# Patient Record
Sex: Female | Born: 1975 | Race: White | Hispanic: No | Smoking: Never smoker
Health system: Southern US, Community
[De-identification: ages and names within clinical notes are randomized; demographics above are authoritative.]

## PROBLEM LIST (undated history)

## (undated) DIAGNOSIS — D219 Benign neoplasm of connective and other soft tissue, unspecified: Secondary | ICD-10-CM

## (undated) DIAGNOSIS — M75100 Unspecified rotator cuff tear or rupture of unspecified shoulder, not specified as traumatic: Secondary | ICD-10-CM

## (undated) DIAGNOSIS — Z8489 Family history of other specified conditions: Secondary | ICD-10-CM

## (undated) DIAGNOSIS — A749 Chlamydial infection, unspecified: Secondary | ICD-10-CM

## (undated) DIAGNOSIS — R51 Headache: Secondary | ICD-10-CM

## (undated) DIAGNOSIS — F419 Anxiety disorder, unspecified: Secondary | ICD-10-CM

## (undated) DIAGNOSIS — B977 Papillomavirus as the cause of diseases classified elsewhere: Secondary | ICD-10-CM

## (undated) DIAGNOSIS — K219 Gastro-esophageal reflux disease without esophagitis: Secondary | ICD-10-CM

## (undated) DIAGNOSIS — R011 Cardiac murmur, unspecified: Secondary | ICD-10-CM

## (undated) DIAGNOSIS — M1990 Unspecified osteoarthritis, unspecified site: Secondary | ICD-10-CM

## (undated) DIAGNOSIS — IMO0002 Reserved for concepts with insufficient information to code with codable children: Secondary | ICD-10-CM

## (undated) DIAGNOSIS — Z789 Other specified health status: Secondary | ICD-10-CM

## (undated) DIAGNOSIS — R519 Headache, unspecified: Secondary | ICD-10-CM

## (undated) DIAGNOSIS — F329 Major depressive disorder, single episode, unspecified: Secondary | ICD-10-CM

## (undated) DIAGNOSIS — F32A Depression, unspecified: Secondary | ICD-10-CM

## (undated) DIAGNOSIS — I341 Nonrheumatic mitral (valve) prolapse: Secondary | ICD-10-CM

## (undated) HISTORY — DX: Chlamydial infection, unspecified: A74.9

## (undated) HISTORY — DX: Depression, unspecified: F32.A

## (undated) HISTORY — DX: Reserved for concepts with insufficient information to code with codable children: IMO0002

## (undated) HISTORY — DX: Unspecified rotator cuff tear or rupture of unspecified shoulder, not specified as traumatic: M75.100

## (undated) HISTORY — DX: Benign neoplasm of connective and other soft tissue, unspecified: D21.9

## (undated) HISTORY — DX: Papillomavirus as the cause of diseases classified elsewhere: B97.7

## (undated) HISTORY — DX: Anxiety disorder, unspecified: F41.9

## (undated) HISTORY — DX: Major depressive disorder, single episode, unspecified: F32.9

## (undated) HISTORY — DX: Other specified health status: Z78.9

## (undated) HISTORY — DX: Cardiac murmur, unspecified: R01.1

## (undated) HISTORY — DX: Nonrheumatic mitral (valve) prolapse: I34.1

## (undated) HISTORY — DX: Unspecified osteoarthritis, unspecified site: M19.90

---

## 1997-06-25 HISTORY — PX: WISDOM TOOTH EXTRACTION: SHX21

## 1998-06-25 DIAGNOSIS — A749 Chlamydial infection, unspecified: Secondary | ICD-10-CM

## 1998-06-25 HISTORY — DX: Chlamydial infection, unspecified: A74.9

## 2001-05-07 ENCOUNTER — Other Ambulatory Visit: Admission: RE | Admit: 2001-05-07 | Discharge: 2001-05-07 | Payer: Self-pay | Admitting: Obstetrics and Gynecology

## 2001-06-25 DIAGNOSIS — B977 Papillomavirus as the cause of diseases classified elsewhere: Secondary | ICD-10-CM

## 2001-06-25 HISTORY — DX: Papillomavirus as the cause of diseases classified elsewhere: B97.7

## 2001-11-06 ENCOUNTER — Other Ambulatory Visit: Admission: RE | Admit: 2001-11-06 | Discharge: 2001-11-06 | Payer: Self-pay | Admitting: Gynecology

## 2002-01-21 ENCOUNTER — Other Ambulatory Visit: Admission: RE | Admit: 2002-01-21 | Discharge: 2002-01-21 | Payer: Self-pay | Admitting: Gynecology

## 2002-09-23 ENCOUNTER — Other Ambulatory Visit: Admission: RE | Admit: 2002-09-23 | Discharge: 2002-09-23 | Payer: Self-pay | Admitting: Gynecology

## 2003-04-02 ENCOUNTER — Other Ambulatory Visit: Admission: RE | Admit: 2003-04-02 | Discharge: 2003-04-02 | Payer: Self-pay | Admitting: Gynecology

## 2003-12-13 ENCOUNTER — Other Ambulatory Visit: Admission: RE | Admit: 2003-12-13 | Discharge: 2003-12-13 | Payer: Self-pay | Admitting: Gynecology

## 2004-05-05 ENCOUNTER — Other Ambulatory Visit: Admission: RE | Admit: 2004-05-05 | Discharge: 2004-05-05 | Payer: Self-pay | Admitting: Gynecology

## 2005-05-08 ENCOUNTER — Other Ambulatory Visit: Admission: RE | Admit: 2005-05-08 | Discharge: 2005-05-08 | Payer: Self-pay | Admitting: Gynecology

## 2006-05-09 ENCOUNTER — Other Ambulatory Visit: Admission: RE | Admit: 2006-05-09 | Discharge: 2006-05-09 | Payer: Self-pay | Admitting: Gynecology

## 2007-05-14 ENCOUNTER — Other Ambulatory Visit: Admission: RE | Admit: 2007-05-14 | Discharge: 2007-05-14 | Payer: Self-pay | Admitting: Obstetrics and Gynecology

## 2008-06-25 DIAGNOSIS — Z789 Other specified health status: Secondary | ICD-10-CM

## 2008-06-25 HISTORY — DX: Other specified health status: Z78.9

## 2008-07-29 ENCOUNTER — Encounter: Payer: Self-pay | Admitting: Women's Health

## 2008-07-29 ENCOUNTER — Other Ambulatory Visit: Admission: RE | Admit: 2008-07-29 | Discharge: 2008-07-29 | Payer: Self-pay | Admitting: Gynecology

## 2008-07-29 ENCOUNTER — Ambulatory Visit: Payer: Self-pay | Admitting: Women's Health

## 2008-10-18 ENCOUNTER — Ambulatory Visit: Payer: Self-pay | Admitting: Women's Health

## 2009-08-01 ENCOUNTER — Ambulatory Visit: Payer: Self-pay | Admitting: Women's Health

## 2009-08-01 ENCOUNTER — Other Ambulatory Visit: Admission: RE | Admit: 2009-08-01 | Discharge: 2009-08-01 | Payer: Self-pay | Admitting: Gynecology

## 2010-08-04 ENCOUNTER — Other Ambulatory Visit: Payer: Self-pay | Admitting: Women's Health

## 2010-08-04 ENCOUNTER — Other Ambulatory Visit (HOSPITAL_COMMUNITY)
Admission: RE | Admit: 2010-08-04 | Discharge: 2010-08-04 | Disposition: A | Discharge status: Home / Self Care | Payer: BC Managed Care – PPO | Source: Ambulatory Visit | Attending: Gynecology | Admitting: Gynecology

## 2010-08-04 ENCOUNTER — Encounter (INDEPENDENT_AMBULATORY_CARE_PROVIDER_SITE_OTHER): Payer: BC Managed Care – PPO | Admitting: Women's Health

## 2010-08-04 DIAGNOSIS — Z124 Encounter for screening for malignant neoplasm of cervix: Secondary | ICD-10-CM | POA: Insufficient documentation

## 2010-08-04 DIAGNOSIS — Z01419 Encounter for gynecological examination (general) (routine) without abnormal findings: Secondary | ICD-10-CM

## 2010-08-04 DIAGNOSIS — B373 Candidiasis of vulva and vagina: Secondary | ICD-10-CM

## 2010-09-07 ENCOUNTER — Ambulatory Visit: Payer: BC Managed Care – PPO | Admitting: Women's Health

## 2010-09-08 ENCOUNTER — Ambulatory Visit (INDEPENDENT_AMBULATORY_CARE_PROVIDER_SITE_OTHER): Payer: BC Managed Care – PPO | Admitting: Women's Health

## 2010-09-08 DIAGNOSIS — N898 Other specified noninflammatory disorders of vagina: Secondary | ICD-10-CM

## 2010-09-08 DIAGNOSIS — B373 Candidiasis of vulva and vagina: Secondary | ICD-10-CM

## 2010-09-08 DIAGNOSIS — B3731 Acute candidiasis of vulva and vagina: Secondary | ICD-10-CM

## 2010-09-18 ENCOUNTER — Ambulatory Visit (INDEPENDENT_AMBULATORY_CARE_PROVIDER_SITE_OTHER): Payer: BC Managed Care – PPO | Admitting: Women's Health

## 2010-09-18 ENCOUNTER — Other Ambulatory Visit: Payer: BC Managed Care – PPO

## 2010-09-18 DIAGNOSIS — R1031 Right lower quadrant pain: Secondary | ICD-10-CM

## 2010-09-18 DIAGNOSIS — D259 Leiomyoma of uterus, unspecified: Secondary | ICD-10-CM

## 2010-09-18 DIAGNOSIS — IMO0002 Reserved for concepts with insufficient information to code with codable children: Secondary | ICD-10-CM

## 2011-01-10 ENCOUNTER — Ambulatory Visit (INDEPENDENT_AMBULATORY_CARE_PROVIDER_SITE_OTHER): Payer: BC Managed Care – PPO | Admitting: Women's Health

## 2011-01-10 DIAGNOSIS — B373 Candidiasis of vulva and vagina: Secondary | ICD-10-CM

## 2011-01-10 DIAGNOSIS — N898 Other specified noninflammatory disorders of vagina: Secondary | ICD-10-CM

## 2011-01-11 ENCOUNTER — Encounter: Payer: Self-pay | Admitting: *Deleted

## 2011-02-28 ENCOUNTER — Encounter: Payer: Self-pay | Admitting: Women's Health

## 2011-02-28 ENCOUNTER — Ambulatory Visit (INDEPENDENT_AMBULATORY_CARE_PROVIDER_SITE_OTHER): Payer: BC Managed Care – PPO | Admitting: Women's Health

## 2011-02-28 VITALS — BP 130/70

## 2011-02-28 DIAGNOSIS — B373 Candidiasis of vulva and vagina: Secondary | ICD-10-CM

## 2011-02-28 DIAGNOSIS — R350 Frequency of micturition: Secondary | ICD-10-CM

## 2011-02-28 MED ORDER — FLUCONAZOLE 150 MG PO TABS: 150.0000 mg | ORAL_TABLET | Freq: Once | ORAL | Status: AC

## 2011-02-28 NOTE — Progress Notes (Signed)
  Presents with a complaint of increased urinary frequency, pain at the end of the stream, states treated self with Macrobid twice daily for the last 2 days.(Had a RX from greater than a year ago received for trip).  States is feeling better. Denies a fever, no CVAT. UA today is completely negative. Will check a urine culture  Assessment: Probable UTI  Will finish 3 days of Macrobid twice daily, if urine culture is negative will stop after 3 days, check a test of cure in 2 weeks.

## 2012-01-10 ENCOUNTER — Other Ambulatory Visit: Payer: Self-pay | Admitting: Women's Health

## 2012-01-10 MED ORDER — NORETHINDRONE-ETH ESTRADIOL 0.5-35 MG-MCG PO TABS: 1.0000 | ORAL_TABLET | Freq: Every day | ORAL | Status: DC

## 2012-01-10 NOTE — Telephone Encounter (Signed)
I called patient and scheduled CE for next week.  Rx for Oc's refilled one time.

## 2012-01-17 ENCOUNTER — Ambulatory Visit (INDEPENDENT_AMBULATORY_CARE_PROVIDER_SITE_OTHER): Payer: BC Managed Care – PPO | Admitting: Women's Health

## 2012-01-17 ENCOUNTER — Other Ambulatory Visit (HOSPITAL_COMMUNITY)
Admission: RE | Admit: 2012-01-17 | Discharge: 2012-01-17 | Disposition: A | Discharge status: Home / Self Care | Payer: BC Managed Care – PPO | Source: Ambulatory Visit | Attending: Women's Health | Admitting: Women's Health

## 2012-01-17 ENCOUNTER — Encounter: Payer: Self-pay | Admitting: Women's Health

## 2012-01-17 VITALS — BP 112/70 | Ht 68.5 in | Wt 168.0 lb

## 2012-01-17 DIAGNOSIS — Z01419 Encounter for gynecological examination (general) (routine) without abnormal findings: Secondary | ICD-10-CM

## 2012-01-17 DIAGNOSIS — N898 Other specified noninflammatory disorders of vagina: Secondary | ICD-10-CM

## 2012-01-17 DIAGNOSIS — Z1151 Encounter for screening for human papillomavirus (HPV): Secondary | ICD-10-CM | POA: Insufficient documentation

## 2012-01-17 DIAGNOSIS — IMO0001 Reserved for inherently not codable concepts without codable children: Secondary | ICD-10-CM

## 2012-01-17 DIAGNOSIS — Z833 Family history of diabetes mellitus: Secondary | ICD-10-CM

## 2012-01-17 DIAGNOSIS — I059 Rheumatic mitral valve disease, unspecified: Secondary | ICD-10-CM

## 2012-01-17 DIAGNOSIS — N870 Mild cervical dysplasia: Secondary | ICD-10-CM | POA: Insufficient documentation

## 2012-01-17 DIAGNOSIS — I341 Nonrheumatic mitral (valve) prolapse: Secondary | ICD-10-CM | POA: Insufficient documentation

## 2012-01-17 DIAGNOSIS — Z309 Encounter for contraceptive management, unspecified: Secondary | ICD-10-CM

## 2012-01-17 DIAGNOSIS — L293 Anogenital pruritus, unspecified: Secondary | ICD-10-CM

## 2012-01-17 LAB — CBC WITH DIFFERENTIAL/PLATELET
Basophils Absolute: 0 K/uL (ref 0.0–0.1)
Basophils Relative: 1 % (ref 0–1)
Eosinophils Absolute: 0.2 K/uL (ref 0.0–0.7)
Eosinophils Relative: 4 % (ref 0–5)
HCT: 38.1 % (ref 36.0–46.0)
Hemoglobin: 13.1 g/dL (ref 12.0–15.0)
Lymphocytes Relative: 34 % (ref 12–46)
Lymphs Abs: 1.8 K/uL (ref 0.7–4.0)
MCH: 30.1 pg (ref 26.0–34.0)
MCHC: 34.4 g/dL (ref 30.0–36.0)
MCV: 87.6 fL (ref 78.0–100.0)
Monocytes Absolute: 0.4 K/uL (ref 0.1–1.0)
Monocytes Relative: 9 % (ref 3–12)
Neutro Abs: 2.7 K/uL (ref 1.7–7.7)
Neutrophils Relative %: 52 % (ref 43–77)
Platelets: 209 K/uL (ref 150–400)
RBC: 4.35 MIL/uL (ref 3.87–5.11)
RDW: 12.7 % (ref 11.5–15.5)
WBC: 5.2 K/uL (ref 4.0–10.5)

## 2012-01-17 LAB — WET PREP FOR TRICH, YEAST, CLUE
Clue Cells Wet Prep HPF POC: NONE SEEN
Trich, Wet Prep: NONE SEEN
Yeast Wet Prep HPF POC: NONE SEEN

## 2012-01-17 LAB — GLUCOSE, RANDOM: Glucose, Bld: 78 mg/dL (ref 70–99)

## 2012-01-17 MED ORDER — TERCONAZOLE 0.8 % VA CREA: 1.0000 | TOPICAL_CREAM | Freq: Every day | VAGINAL | Status: AC

## 2012-01-17 MED ORDER — NORETHINDRONE-ETH ESTRADIOL 0.5-35 MG-MCG PO TABS: 1.0000 | ORAL_TABLET | Freq: Every day | ORAL | Status: DC

## 2012-01-17 NOTE — Patient Instructions (Addendum)

## 2012-01-17 NOTE — Progress Notes (Signed)
[facility] 1976/02/15 [id number]    History:    The patient presents for annual exam.  Monthly cycle on Necon. 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 Paps normal since 11/05. Has had some problems with recurrent yeast in the past. Currently seeing a counselor for self-esteem/relationship issues on no medication. . Past medical history, past surgical history, family history and social history were all reviewed and documented in the EPIC chart. 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 Rubella immune/2010.   ROS:  A  ROS was performed and pertinent positives and negatives are included in the history.  Exam:  Filed Vitals:   01/17/12 1558  BP: 112/70    General appearance:  Normal Head/Neck:  Normal, without cervical or supraclavicular adenopathy. Thyroid:  Symmetrical, normal in size, without palpable masses or nodularity. Respiratory  Effort:  Normal  Auscultation:  Clear without wheezing or rhonchi Cardiovascular  Auscultation:  Regular rate, without rubs, murmurs or gallops  Edema/varicosities:  Not grossly evident Abdominal  Soft,nontender, without masses, guarding or rebound.  Liver/spleen:  No organomegaly noted  Hernia:  None appreciated  Skin  Inspection:  Grossly normal  Palpation:  Grossly normal Neurologic/psychiatric  Orientation:  Normal with appropriate conversation.  Mood/affect:  Normal  Genitourinary    Breasts: Examined lying and sitting.     Right: Without masses, retractions, discharge or axillary adenopathy.     Left: Without masses, retractions, discharge or axillary adenopathy.   Inguinal/mons:  Normal without inguinal adenopathy  External genitalia:  Normal  BUS/Urethra/Skene's glands:  Normal  Bladder:  Normal  Vagina:  Normal wet prep negative  Cervix:  Normal  Uterus:   normal in size, shape and contour.  Midline and mobile  Adnexa/parametria:     Rt: Without masses or  tenderness.   Lt: Without masses or tenderness.  Anus and perineum: Normal  Digital rectal exam: Normal sphincter tone without palpated masses or tenderness  Assessment/Plan:  36 y.o. S WF G0  for annual exam with no complaints.     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 Aware wet prep was negative today and is symptom-free today. SBE's, exercise, calcium rich diet, MVI daily encouraged. No plans for pregnancy at this time. Necon 0.5/35 prescription, proper use, slight risk for blood clots and strokes reviewed. CBC, glucose, UA and Pap with HR HPV typing.  Screening recommendations reviewed.  Harrington Challenger WHNP, 5:02 PM 01/17/2012

## 2012-01-18 LAB — URINALYSIS W MICROSCOPIC + REFLEX CULTURE
Glucose, UA: NEGATIVE mg/dL
Leukocytes, UA: NEGATIVE
Nitrite: NEGATIVE
Protein, ur: NEGATIVE mg/dL
Squamous Epithelial / LPF: NONE SEEN

## 2012-02-14 ENCOUNTER — Other Ambulatory Visit: Payer: Self-pay | Admitting: *Deleted

## 2012-02-14 DIAGNOSIS — IMO0001 Reserved for inherently not codable concepts without codable children: Secondary | ICD-10-CM

## 2012-02-14 MED ORDER — NORETHINDRONE-ETH ESTRADIOL 0.5-35 MG-MCG PO TABS: 1.0000 | ORAL_TABLET | Freq: Every day | ORAL | Status: DC

## 2012-04-02 ENCOUNTER — Ambulatory Visit (INDEPENDENT_AMBULATORY_CARE_PROVIDER_SITE_OTHER): Payer: BC Managed Care – PPO | Admitting: Licensed Clinical Social Worker

## 2012-04-02 DIAGNOSIS — F4320 Adjustment disorder, unspecified: Secondary | ICD-10-CM

## 2012-04-23 ENCOUNTER — Ambulatory Visit (INDEPENDENT_AMBULATORY_CARE_PROVIDER_SITE_OTHER): Payer: BC Managed Care – PPO | Admitting: Licensed Clinical Social Worker

## 2012-04-23 DIAGNOSIS — F4320 Adjustment disorder, unspecified: Secondary | ICD-10-CM

## 2012-05-19 ENCOUNTER — Ambulatory Visit (INDEPENDENT_AMBULATORY_CARE_PROVIDER_SITE_OTHER): Payer: BC Managed Care – PPO | Admitting: Licensed Clinical Social Worker

## 2012-05-19 DIAGNOSIS — F4320 Adjustment disorder, unspecified: Secondary | ICD-10-CM

## 2012-06-09 ENCOUNTER — Ambulatory Visit (INDEPENDENT_AMBULATORY_CARE_PROVIDER_SITE_OTHER): Payer: BC Managed Care – PPO | Admitting: Licensed Clinical Social Worker

## 2012-06-09 DIAGNOSIS — F4320 Adjustment disorder, unspecified: Secondary | ICD-10-CM

## 2012-07-10 ENCOUNTER — Ambulatory Visit (INDEPENDENT_AMBULATORY_CARE_PROVIDER_SITE_OTHER): Payer: Managed Care, Other (non HMO) | Admitting: Licensed Clinical Social Worker

## 2012-07-10 DIAGNOSIS — F4320 Adjustment disorder, unspecified: Secondary | ICD-10-CM

## 2012-07-11 ENCOUNTER — Ambulatory Visit: Payer: BC Managed Care – PPO | Admitting: Licensed Clinical Social Worker

## 2012-08-04 ENCOUNTER — Ambulatory Visit (INDEPENDENT_AMBULATORY_CARE_PROVIDER_SITE_OTHER): Payer: Managed Care, Other (non HMO) | Admitting: Licensed Clinical Social Worker

## 2012-08-04 DIAGNOSIS — F4320 Adjustment disorder, unspecified: Secondary | ICD-10-CM

## 2012-09-01 ENCOUNTER — Ambulatory Visit (INDEPENDENT_AMBULATORY_CARE_PROVIDER_SITE_OTHER): Payer: Managed Care, Other (non HMO) | Admitting: Licensed Clinical Social Worker

## 2012-09-01 DIAGNOSIS — F4320 Adjustment disorder, unspecified: Secondary | ICD-10-CM

## 2012-09-24 ENCOUNTER — Ambulatory Visit (INDEPENDENT_AMBULATORY_CARE_PROVIDER_SITE_OTHER): Payer: Managed Care, Other (non HMO) | Admitting: Licensed Clinical Social Worker

## 2012-09-24 DIAGNOSIS — F4320 Adjustment disorder, unspecified: Secondary | ICD-10-CM

## 2012-10-15 ENCOUNTER — Ambulatory Visit (INDEPENDENT_AMBULATORY_CARE_PROVIDER_SITE_OTHER): Payer: 59 | Admitting: Licensed Clinical Social Worker

## 2012-10-15 DIAGNOSIS — F4320 Adjustment disorder, unspecified: Secondary | ICD-10-CM

## 2012-11-26 ENCOUNTER — Ambulatory Visit (INDEPENDENT_AMBULATORY_CARE_PROVIDER_SITE_OTHER): Payer: 59 | Admitting: Licensed Clinical Social Worker

## 2012-11-26 DIAGNOSIS — F4320 Adjustment disorder, unspecified: Secondary | ICD-10-CM

## 2013-01-05 ENCOUNTER — Ambulatory Visit (INDEPENDENT_AMBULATORY_CARE_PROVIDER_SITE_OTHER): Payer: 59 | Admitting: Licensed Clinical Social Worker

## 2013-01-05 DIAGNOSIS — F4320 Adjustment disorder, unspecified: Secondary | ICD-10-CM

## 2013-01-22 ENCOUNTER — Other Ambulatory Visit: Payer: Self-pay

## 2013-01-22 DIAGNOSIS — IMO0001 Reserved for inherently not codable concepts without codable children: Secondary | ICD-10-CM

## 2013-01-22 MED ORDER — NORETHINDRONE-ETH ESTRADIOL 0.5-35 MG-MCG PO TABS: 1.0000 | ORAL_TABLET | Freq: Every day | ORAL | Status: DC

## 2013-02-25 ENCOUNTER — Ambulatory Visit (INDEPENDENT_AMBULATORY_CARE_PROVIDER_SITE_OTHER): Payer: 59 | Admitting: Licensed Clinical Social Worker

## 2013-02-25 ENCOUNTER — Other Ambulatory Visit: Payer: Self-pay | Admitting: Women's Health

## 2013-02-25 DIAGNOSIS — F4320 Adjustment disorder, unspecified: Secondary | ICD-10-CM

## 2013-02-25 DIAGNOSIS — IMO0001 Reserved for inherently not codable concepts without codable children: Secondary | ICD-10-CM

## 2013-02-25 MED ORDER — NORETHINDRONE-ETH ESTRADIOL 0.5-35 MG-MCG PO TABS: 1.0000 | ORAL_TABLET | Freq: Every day | ORAL | Status: DC

## 2013-03-20 ENCOUNTER — Ambulatory Visit (INDEPENDENT_AMBULATORY_CARE_PROVIDER_SITE_OTHER): Payer: Managed Care, Other (non HMO) | Admitting: Women's Health

## 2013-03-20 ENCOUNTER — Other Ambulatory Visit (HOSPITAL_COMMUNITY)
Admission: RE | Admit: 2013-03-20 | Discharge: 2013-03-20 | Disposition: A | Discharge status: Home / Self Care | Payer: Managed Care, Other (non HMO) | Source: Ambulatory Visit | Attending: Gynecology | Admitting: Gynecology

## 2013-03-20 ENCOUNTER — Encounter: Payer: Self-pay | Admitting: Women's Health

## 2013-03-20 VITALS — BP 108/70 | Ht 67.0 in | Wt 172.0 lb

## 2013-03-20 DIAGNOSIS — Z309 Encounter for contraceptive management, unspecified: Secondary | ICD-10-CM

## 2013-03-20 DIAGNOSIS — Z01419 Encounter for gynecological examination (general) (routine) without abnormal findings: Secondary | ICD-10-CM

## 2013-03-20 DIAGNOSIS — Z13220 Encounter for screening for lipoid disorders: Secondary | ICD-10-CM

## 2013-03-20 DIAGNOSIS — N938 Other specified abnormal uterine and vaginal bleeding: Secondary | ICD-10-CM

## 2013-03-20 DIAGNOSIS — L293 Anogenital pruritus, unspecified: Secondary | ICD-10-CM

## 2013-03-20 DIAGNOSIS — N925 Other specified irregular menstruation: Secondary | ICD-10-CM

## 2013-03-20 DIAGNOSIS — IMO0001 Reserved for inherently not codable concepts without codable children: Secondary | ICD-10-CM

## 2013-03-20 DIAGNOSIS — N898 Other specified noninflammatory disorders of vagina: Secondary | ICD-10-CM

## 2013-03-20 DIAGNOSIS — N949 Unspecified condition associated with female genital organs and menstrual cycle: Secondary | ICD-10-CM

## 2013-03-20 LAB — CBC WITH DIFFERENTIAL/PLATELET
Basophils Relative: 1 % (ref 0–1)
Eosinophils Absolute: 0.4 10*3/uL (ref 0.0–0.7)
Eosinophils Relative: 6 % — ABNORMAL HIGH (ref 0–5)
Hemoglobin: 14.1 g/dL (ref 12.0–15.0)
Lymphs Abs: 1.7 10*3/uL (ref 0.7–4.0)
MCH: 31 pg (ref 26.0–34.0)
MCHC: 34.9 g/dL (ref 30.0–36.0)
MCV: 88.8 fL (ref 78.0–100.0)
Monocytes Relative: 6 % (ref 3–12)
Neutrophils Relative %: 58 % (ref 43–77)
Platelets: 195 10*3/uL (ref 150–400)
RBC: 4.55 MIL/uL (ref 3.87–5.11)

## 2013-03-20 LAB — WET PREP FOR TRICH, YEAST, CLUE
Clue Cells Wet Prep HPF POC: NONE SEEN
Trich, Wet Prep: NONE SEEN

## 2013-03-20 LAB — TSH: TSH: 1.556 u[IU]/mL (ref 0.350–4.500)

## 2013-03-20 MED ORDER — TERCONAZOLE 0.8 % VA CREA: 1.0000 | TOPICAL_CREAM | Freq: Every day | VAGINAL | Status: DC

## 2013-03-20 MED ORDER — NORETHINDRONE-ETH ESTRADIOL 0.5-35 MG-MCG PO TABS: 1.0000 | ORAL_TABLET | Freq: Every day | ORAL | Status: DC

## 2013-03-20 NOTE — Addendum Note (Signed)
Addended by: Richardson Chiquito on: 03/20/2013 10:58 AM   Modules accepted: Orders

## 2013-03-20 NOTE — Patient Instructions (Addendum)

## 2013-03-20 NOTE — Progress Notes (Signed)
Caroline Hayes [birth year] [id number]    History:    The patient presents for annual exam.  States has had spotting for the past 4-5 months periodically throughout the month. No missed pills. History of recurrent yeast, minimal problems this past year, states feels has yeast now. CIN-2 with negative C&B 2003, CIN-1 on colposcopy 2003. Normal Paps after. Contraceptives with Necon/same partner. Has seen a counselor in the past on no medication and feels that she is doing better. Mitral valve prolapse on no medication. Normal lipid panel and glucose at work. Undecided about children.  Past medical history, past surgical history, family history and social history were all reviewed and documented in the EPIC chart. 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 Maternal aunt breast cancer. Maternal grandmother Alzheimer's.   ROS:  A  ROS was performed and pertinent positives and negatives are included in the history.  Exam:  Filed Vitals:   03/20/13 0810  BP: 108/70    General appearance:  Normal Head/Neck:  Normal, without cervical or supraclavicular adenopathy. Thyroid:  Symmetrical, normal in size, without palpable masses or nodularity. Respiratory  Effort:  Normal  Auscultation:  Clear without wheezing or rhonchi Cardiovascular  Auscultation:  Regular rate, without rubs, murmurs or gallops  Edema/varicosities:  Not grossly evident Abdominal  Soft,nontender, without masses, guarding or rebound.  Liver/spleen:  No organomegaly noted  Hernia:  None appreciated  Skin  Inspection:  Grossly normal  Palpation:  Grossly normal Neurologic/psychiatric  Orientation:  Normal with appropriate conversation.  Mood/affect:  Normal  Genitourinary    Breasts: Examined lying and sitting.     Right: Without masses, retractions, discharge or axillary adenopathy.     Left: Without masses, retractions, discharge or axillary adenopathy.   Inguinal/mons:  Normal without inguinal  adenopathy  External genitalia:  Normal  BUS/Urethra/Skene's glands:  Normal  Bladder:  Normal  Vagina:  Wet prep positive for yeast mild erythema  Cervix:  Normal  Uterus:  normal in size, shape and contour.  Midline and mobile  Adnexa/parametria:     Rt: Without masses or tenderness.   Lt: Without masses or tenderness.  Anus and perineum: Normal  Digital rectal exam: Normal sphincter tone without palpated masses or tenderness  Assessment/Plan:  37 y.o. S. WF G0  for annual exam.    DUB for 4-5 months Yeast/history of recurrent yeast History of anxiety and depression stable no medication CIN-2 with CIN-1 noted on  biopsy 2003 normal Paps after  Plan: Terazol 3 prescription, proper use given and reviewed. Prescription for boric acid gel caps twice weekly to help prevent recurrence, instructed to call if no relief. Necon prescription, proper use, slight risk for blood clots and strokes reviewed. Schedule sonohysterogram with Dr. Lily Peer after next cycle. SBE's, continue regular exercise active lifestyle, folic acid. TSH, prolactin, CBC, UA, Pap. Pap normal 2013.   Harrington Challenger [facility], 8:58 AM 03/20/2013

## 2013-03-21 LAB — URINALYSIS W MICROSCOPIC + REFLEX CULTURE
Bacteria, UA: NONE SEEN
Casts: NONE SEEN
Glucose, UA: NEGATIVE mg/dL
Hgb urine dipstick: NEGATIVE
Ketones, ur: NEGATIVE mg/dL
Protein, ur: NEGATIVE mg/dL
pH: 7 (ref 5.0–8.0)

## 2013-03-21 LAB — PROLACTIN: Prolactin: 7.6 ng/mL

## 2013-03-23 ENCOUNTER — Ambulatory Visit (INDEPENDENT_AMBULATORY_CARE_PROVIDER_SITE_OTHER): Payer: 59 | Admitting: Licensed Clinical Social Worker

## 2013-03-23 DIAGNOSIS — F4320 Adjustment disorder, unspecified: Secondary | ICD-10-CM

## 2013-03-26 ENCOUNTER — Encounter: Payer: Self-pay | Admitting: Women's Health

## 2013-03-30 ENCOUNTER — Encounter: Payer: Self-pay | Admitting: Women's Health

## 2013-04-20 ENCOUNTER — Ambulatory Visit (INDEPENDENT_AMBULATORY_CARE_PROVIDER_SITE_OTHER): Payer: 59 | Admitting: Licensed Clinical Social Worker

## 2013-04-20 DIAGNOSIS — F4320 Adjustment disorder, unspecified: Secondary | ICD-10-CM

## 2013-05-27 ENCOUNTER — Ambulatory Visit (INDEPENDENT_AMBULATORY_CARE_PROVIDER_SITE_OTHER): Payer: 59 | Admitting: Licensed Clinical Social Worker

## 2013-05-27 DIAGNOSIS — F4320 Adjustment disorder, unspecified: Secondary | ICD-10-CM

## 2013-07-01 ENCOUNTER — Ambulatory Visit (INDEPENDENT_AMBULATORY_CARE_PROVIDER_SITE_OTHER): Payer: 59 | Admitting: Licensed Clinical Social Worker

## 2013-07-01 DIAGNOSIS — F4320 Adjustment disorder, unspecified: Secondary | ICD-10-CM

## 2013-08-12 ENCOUNTER — Ambulatory Visit (INDEPENDENT_AMBULATORY_CARE_PROVIDER_SITE_OTHER): Payer: 59 | Admitting: Licensed Clinical Social Worker

## 2013-08-12 DIAGNOSIS — F4321 Adjustment disorder with depressed mood: Secondary | ICD-10-CM

## 2013-08-13 ENCOUNTER — Telehealth: Payer: Self-pay | Admitting: *Deleted

## 2013-08-13 NOTE — Telephone Encounter (Signed)
Pt called requesting call back about ultrasound that should be scheduled? I left message on her voicemail per note on 03/20/13 pt will need SHGM with JF.

## 2013-08-19 ENCOUNTER — Other Ambulatory Visit: Payer: Self-pay | Admitting: Women's Health

## 2013-08-19 DIAGNOSIS — N938 Other specified abnormal uterine and vaginal bleeding: Secondary | ICD-10-CM

## 2013-09-01 ENCOUNTER — Other Ambulatory Visit: Payer: Self-pay | Admitting: Gynecology

## 2013-09-01 DIAGNOSIS — N920 Excessive and frequent menstruation with regular cycle: Secondary | ICD-10-CM

## 2013-09-02 ENCOUNTER — Other Ambulatory Visit: Payer: Self-pay | Admitting: Gynecology

## 2013-09-02 ENCOUNTER — Other Ambulatory Visit: Payer: Managed Care, Other (non HMO)

## 2013-09-02 ENCOUNTER — Other Ambulatory Visit (HOSPITAL_COMMUNITY)
Admission: RE | Admit: 2013-09-02 | Discharge: 2013-09-02 | Disposition: A | Discharge status: Home / Self Care | Payer: Managed Care, Other (non HMO) | Source: Ambulatory Visit | Attending: Gynecology | Admitting: Gynecology

## 2013-09-02 ENCOUNTER — Ambulatory Visit (INDEPENDENT_AMBULATORY_CARE_PROVIDER_SITE_OTHER): Payer: Managed Care, Other (non HMO) | Admitting: Gynecology

## 2013-09-02 ENCOUNTER — Ambulatory Visit: Payer: Managed Care, Other (non HMO) | Admitting: Gynecology

## 2013-09-02 ENCOUNTER — Ambulatory Visit (INDEPENDENT_AMBULATORY_CARE_PROVIDER_SITE_OTHER): Payer: Managed Care, Other (non HMO)

## 2013-09-02 DIAGNOSIS — N938 Other specified abnormal uterine and vaginal bleeding: Secondary | ICD-10-CM

## 2013-09-02 DIAGNOSIS — D251 Intramural leiomyoma of uterus: Secondary | ICD-10-CM

## 2013-09-02 DIAGNOSIS — N920 Excessive and frequent menstruation with regular cycle: Secondary | ICD-10-CM

## 2013-09-02 DIAGNOSIS — D252 Subserosal leiomyoma of uterus: Secondary | ICD-10-CM

## 2013-09-02 DIAGNOSIS — D259 Leiomyoma of uterus, unspecified: Secondary | ICD-10-CM

## 2013-09-02 DIAGNOSIS — N949 Unspecified condition associated with female genital organs and menstrual cycle: Secondary | ICD-10-CM

## 2013-09-02 DIAGNOSIS — N925 Other specified irregular menstruation: Secondary | ICD-10-CM

## 2013-09-02 LAB — CBC WITH DIFFERENTIAL/PLATELET
BASOS ABS: 0 10*3/uL (ref 0.0–0.1)
Basophils Relative: 0 % (ref 0–1)
Eosinophils Absolute: 0.4 10*3/uL (ref 0.0–0.7)
Eosinophils Relative: 5 % (ref 0–5)
HEMATOCRIT: 39.7 % (ref 36.0–46.0)
Hemoglobin: 13.8 g/dL (ref 12.0–15.0)
LYMPHS PCT: 22 % (ref 12–46)
Lymphs Abs: 1.6 10*3/uL (ref 0.7–4.0)
MCH: 30.5 pg (ref 26.0–34.0)
MCHC: 34.8 g/dL (ref 30.0–36.0)
MCV: 87.6 fL (ref 78.0–100.0)
Monocytes Absolute: 0.4 10*3/uL (ref 0.1–1.0)
Monocytes Relative: 5 % (ref 3–12)
NEUTROS ABS: 5 10*3/uL (ref 1.7–7.7)
NEUTROS PCT: 68 % (ref 43–77)
Platelets: 216 10*3/uL (ref 150–400)
RBC: 4.53 MIL/uL (ref 3.87–5.11)
RDW: 12.9 % (ref 11.5–15.5)
WBC: 7.4 10*3/uL (ref 4.0–10.5)

## 2013-09-02 LAB — TSH: TSH: 2.162 u[IU]/mL (ref 0.350–4.500)

## 2013-09-02 NOTE — Progress Notes (Signed)
   Patient presented to the office today for a sonohysterogram as a result of her spotting for the past 4 or 5 months throughout the month while being on oral contraceptive pill Necon. Patient stated she is used to good compliance.CIN-2 with negative C&B 2003, CIN-1 on colposcopy 2003. Normal Pap smear 2014.  Ultrasound today: Uterus measuring 1.5 x 6.1 x 5.8 cm with endometrial stripe of 3.3 mm. Patient had 2 fibroids largest one measuring 41 x 41 x 31 mm displacing the endometrium and a smaller one measuring 9 x 3 mm and a third small one measures 9 x 4 mm. Endometrial stripe 3.3 mm avascular. Both right and left lower were normal. After instilling normal saline into the uterine cavity there was no intracavitary defects noted slight indentation from the posterior fibroid.  Patient's cervix was then cleansed with Betadine solution and a sterile Pipelle was introduced into the uterine cavity and tissue was obtained for histological evaluation.  Assessment/plan: Patient with breakthrough bleeding on oral contraceptive pill. We'll check daily CBC, TSH and prolactin. Biopsy results pending at time of this dictation. She will finish her current pack of OCP and we will place a Mirena IUD with a start her next cycle. Literature and information was provided.

## 2013-09-02 NOTE — Patient Instructions (Signed)
Levonorgestrel intrauterine device (IUD) What is this medicine? LEVONORGESTREL IUD (LEE voe nor jes trel) is a contraceptive (birth control) device. The device is placed inside the uterus by a healthcare professional. It is used to prevent pregnancy and can also be used to treat heavy bleeding that occurs during your period. Depending on the device, it can be used for 3 to 5 years. This medicine may be used for other purposes; ask your health care provider or pharmacist if you have questions. COMMON BRAND NAME(S): Mirena, Skyla What should I tell my health care provider before I take this medicine? They need to know if you have any of these conditions: -abnormal Pap smear -cancer of the breast, uterus, or cervix -diabetes -endometritis -genital or pelvic infection now or in the past -have more than one sexual partner or your partner has more than one partner -heart disease -history of an ectopic or tubal pregnancy -immune system problems -IUD in place -liver disease or tumor -problems with blood clots or take blood-thinners -use intravenous drugs -uterus of unusual shape -vaginal bleeding that has not been explained -an unusual or allergic reaction to levonorgestrel, other hormones, silicone, or polyethylene, medicines, foods, dyes, or preservatives -pregnant or trying to get pregnant -breast-feeding How should I use this medicine? This device is placed inside the uterus by a health care professional. Talk to your pediatrician regarding the use of this medicine in children. Special care may be needed. Overdosage: If you think you have taken too much of this medicine contact a poison control center or emergency room at once. NOTE: This medicine is only for you. Do not share this medicine with others. What if I miss a dose? This does not apply. What may interact with this medicine? Do not take this medicine with any of the following  medications: -amprenavir -bosentan -fosamprenavir This medicine may also interact with the following medications: -aprepitant -barbiturate medicines for inducing sleep or treating seizures -bexarotene -griseofulvin -medicines to treat seizures like carbamazepine, ethotoin, felbamate, oxcarbazepine, phenytoin, topiramate -modafinil -pioglitazone -rifabutin -rifampin -rifapentine -some medicines to treat HIV infection like atazanavir, indinavir, lopinavir, nelfinavir, tipranavir, ritonavir -St. John's wort -warfarin This list may not describe all possible interactions. Give your health care provider a list of all the medicines, herbs, non-prescription drugs, or dietary supplements you use. Also tell them if you smoke, drink alcohol, or use illegal drugs. Some items may interact with your medicine. What should I watch for while using this medicine? Visit your doctor or health care professional for regular check ups. See your doctor if you or your partner has sexual contact with others, becomes HIV positive, or gets a sexual transmitted disease. This product does not protect you against HIV infection (AIDS) or other sexually transmitted diseases. You can check the placement of the IUD yourself by reaching up to the top of your vagina with clean fingers to feel the threads. Do not pull on the threads. It is a good habit to check placement after each menstrual period. Call your doctor right away if you feel more of the IUD than just the threads or if you cannot feel the threads at all. The IUD may come out by itself. You may become pregnant if the device comes out. If you notice that the IUD has come out use a backup birth control method like condoms and call your health care provider. Using tampons will not change the position of the IUD and are okay to use during your period. What side effects may I   notice from receiving this medicine? Side effects that you should report to your doctor or  health care professional as soon as possible: -allergic reactions like skin rash, itching or hives, swelling of the face, lips, or tongue -fever, flu-like symptoms -genital sores -high blood pressure -no menstrual period for 6 weeks during use -pain, swelling, warmth in the leg -pelvic pain or tenderness -severe or sudden headache -signs of pregnancy -stomach cramping -sudden shortness of breath -trouble with balance, talking, or walking -unusual vaginal bleeding, discharge -yellowing of the eyes or skin Side effects that usually do not require medical attention (report to your doctor or health care professional if they continue or are bothersome): -acne -breast pain -change in sex drive or performance -changes in weight -cramping, dizziness, or faintness while the device is being inserted -headache -irregular menstrual bleeding within first 3 to 6 months of use -nausea This list may not describe all possible side effects. Call your doctor for medical advice about side effects. You may report side effects to FDA at 1-800-FDA-1088. Where should I keep my medicine? This does not apply. NOTE: This sheet is a summary. It may not cover all possible information. If you have questions about this medicine, talk to your doctor, pharmacist, or health care provider.  2014, Elsevier/Gold Standard. (2011-07-12 13:54:04)  

## 2013-09-03 LAB — PROLACTIN: Prolactin: 17 ng/mL

## 2013-09-15 ENCOUNTER — Telehealth: Payer: Self-pay | Admitting: Gynecology

## 2013-09-15 ENCOUNTER — Other Ambulatory Visit: Payer: Self-pay | Admitting: Gynecology

## 2013-09-15 ENCOUNTER — Telehealth: Payer: Self-pay | Admitting: *Deleted

## 2013-09-15 DIAGNOSIS — Z3049 Encounter for surveillance of other contraceptives: Secondary | ICD-10-CM

## 2013-09-15 MED ORDER — LEVONORGESTREL 20 MCG/24HR IU IUD: INTRAUTERINE_SYSTEM | Freq: Once | INTRAUTERINE | Status: DC

## 2013-09-15 MED ORDER — TRAMADOL HCL 50 MG PO TABS: ORAL_TABLET | ORAL | Status: DC

## 2013-09-15 NOTE — Telephone Encounter (Signed)
Pt scheduled for Mirena IUD placement on 09/17/13 she is nervous about the pain that may come with this. Pt is requesting something to help with pain prior to placement. Please advise

## 2013-09-15 NOTE — Telephone Encounter (Signed)
rx called in, pt informed. 

## 2013-09-15 NOTE — Telephone Encounter (Signed)
09/15/13-Pt called to schedule Mirena insert but I hadn't received request to check benefits. I called [employer] and it is covered at 100%, no copay. She has appt with JF for 09/17/13.wl

## 2013-09-15 NOTE — Telephone Encounter (Signed)
Ultram 50 mg take one 4 hours before procedure then q 6 ours PRN # [id number]

## 2013-09-17 ENCOUNTER — Encounter: Payer: Self-pay | Admitting: Gynecology

## 2013-09-17 ENCOUNTER — Ambulatory Visit (INDEPENDENT_AMBULATORY_CARE_PROVIDER_SITE_OTHER): Payer: Managed Care, Other (non HMO) | Admitting: Gynecology

## 2013-09-17 VITALS — BP 120/78

## 2013-09-17 DIAGNOSIS — Z30430 Encounter for insertion of intrauterine contraceptive device: Secondary | ICD-10-CM

## 2013-09-17 DIAGNOSIS — Z975 Presence of (intrauterine) contraceptive device: Secondary | ICD-10-CM | POA: Insufficient documentation

## 2013-09-17 NOTE — Patient Instructions (Signed)
Intrauterine Device Information An intrauterine device (IUD) is inserted into your uterus to prevent pregnancy. There are two types of IUDs available:   Copper IUD This type of IUD is wrapped in copper wire and is placed inside the uterus. Copper makes the uterus and fallopian tubes produce a fluid that kills sperm. The copper IUD can stay in place for 10 years.  Hormone IUD This type of IUD contains the hormone progestin (synthetic progesterone). The hormone thickens the cervical mucus and prevents sperm from entering the uterus. It also thins the uterine lining to prevent implantation of a fertilized egg. The hormone can weaken or kill the sperm that get into the uterus. One type of hormone IUD can stay in place for 5 years, and another type can stay in place for 3 years. Your health care provider will make sure you are a good candidate for a contraceptive IUD. Discuss with your health care provider the possible side effects.  ADVANTAGES OF AN INTRAUTERINE DEVICE  IUDs are highly effective, reversible, long acting, and low maintenance.   There are no estrogen-related side effects.   An IUD can be used when breastfeeding.   IUDs are not associated with weight gain.   The copper IUD works immediately after insertion.   The hormone IUD works right away if inserted within 7 days of your period starting. You will need to use a backup method of birth control for 7 days if the hormone IUD is inserted at any other time in your cycle.  The copper IUD does not interfere with your female hormones.   The hormone IUD can make heavy menstrual periods lighter and decrease cramping.   The hormone IUD can be used for 3 or 5 years.   The copper IUD can be used for 10 years. DISADVANTAGES OF AN INTRAUTERINE DEVICE  The hormone IUD can be associated with irregular bleeding patterns.   The copper IUD can make your menstrual flow heavier and more painful.   You may experience cramping and  vaginal bleeding after insertion.  Document Released: 05/15/2004 Document Revised: 02/11/2013 Document Reviewed: 11/30/2012 Exit[employer] Patient Information 2014 [employer].  

## 2013-09-17 NOTE — Progress Notes (Signed)
                                                                     IUD procedure note       Patient presented to the office today for placement of Mirena  IUD. The patient had previously been provided with literature information on this method of contraception. The risks benefits and pros and cons were discussed and all her questions were answered. She is fully aware that this form of contraception is 99% effective and is good for 5 years.  Pelvic exam: Bartholin urethra Skene glands: Within normal limits Vagina: No lesions or discharge Cervix: No lesions or discharge Uterus: AV position Adnexa: No masses or tenderness Rectal exam: Not done  The cervix was cleansed with Betadine solution. A single-tooth tenaculum was placed on the anterior cervical lip. The uterus sounded to 8 centimeter. The IUD was shown to the patient and inserted in a sterile fashion. The IUD string was trimmed. The single-tooth tenaculum was removed. Patient was instructed to return back to the office in one month for follow up.       Lot. No TuOOXFU

## 2013-09-23 ENCOUNTER — Ambulatory Visit (INDEPENDENT_AMBULATORY_CARE_PROVIDER_SITE_OTHER): Payer: 59 | Admitting: Licensed Clinical Social Worker

## 2013-09-23 DIAGNOSIS — F4320 Adjustment disorder, unspecified: Secondary | ICD-10-CM

## 2013-10-15 ENCOUNTER — Ambulatory Visit (INDEPENDENT_AMBULATORY_CARE_PROVIDER_SITE_OTHER): Payer: Managed Care, Other (non HMO) | Admitting: Gynecology

## 2013-10-15 ENCOUNTER — Encounter: Payer: Self-pay | Admitting: Gynecology

## 2013-10-15 VITALS — BP 128/86

## 2013-10-15 DIAGNOSIS — Z30431 Encounter for routine checking of intrauterine contraceptive device: Secondary | ICD-10-CM

## 2013-10-15 NOTE — Progress Notes (Signed)
   The patient presented to the office for her 1 month followup visit after having had a Mirena IUD placed. Patient had some spotting and some mild cramping but otherwise done well.  Exam: Abdomen: Soft nontender rebound guarding Pelvic: The ureters he was within normal limits Vagina: Slight menstrual blood present Cervix: IUD string visualized Bimanual exam: Uterus anteverted normal size shape and consistency Adnexa: No palpable masses. Rectal exam: Not done  Assessment/plan: Patient one month status post placement of Mirena IUD doing well. Patient scheduled to return back in September this year for her annual gynecological examination or when necessary.

## 2013-10-28 ENCOUNTER — Ambulatory Visit: Payer: 59 | Admitting: Licensed Clinical Social Worker

## 2013-11-02 ENCOUNTER — Ambulatory Visit (INDEPENDENT_AMBULATORY_CARE_PROVIDER_SITE_OTHER): Payer: 59 | Admitting: Licensed Clinical Social Worker

## 2013-11-02 DIAGNOSIS — F4320 Adjustment disorder, unspecified: Secondary | ICD-10-CM

## 2013-12-14 ENCOUNTER — Ambulatory Visit: Payer: 59 | Admitting: Licensed Clinical Social Worker

## 2014-01-25 ENCOUNTER — Ambulatory Visit (INDEPENDENT_AMBULATORY_CARE_PROVIDER_SITE_OTHER): Payer: 59 | Admitting: Licensed Clinical Social Worker

## 2014-01-25 DIAGNOSIS — F4320 Adjustment disorder, unspecified: Secondary | ICD-10-CM

## 2014-02-22 ENCOUNTER — Ambulatory Visit (INDEPENDENT_AMBULATORY_CARE_PROVIDER_SITE_OTHER): Payer: 59 | Admitting: Licensed Clinical Social Worker

## 2014-02-22 DIAGNOSIS — F4320 Adjustment disorder, unspecified: Secondary | ICD-10-CM

## 2014-03-22 ENCOUNTER — Ambulatory Visit: Payer: 59 | Admitting: Licensed Clinical Social Worker

## 2014-03-31 ENCOUNTER — Ambulatory Visit (INDEPENDENT_AMBULATORY_CARE_PROVIDER_SITE_OTHER): Payer: 59 | Admitting: Licensed Clinical Social Worker

## 2014-03-31 DIAGNOSIS — F348 Other persistent mood [affective] disorders: Secondary | ICD-10-CM

## 2014-05-26 ENCOUNTER — Ambulatory Visit: Payer: 59 | Admitting: Licensed Clinical Social Worker

## 2014-07-26 HISTORY — PX: ROOT CANAL: SHX2363

## 2014-11-24 ENCOUNTER — Ambulatory Visit (INDEPENDENT_AMBULATORY_CARE_PROVIDER_SITE_OTHER): Payer: Managed Care, Other (non HMO) | Admitting: Nurse Practitioner

## 2014-11-24 ENCOUNTER — Encounter: Payer: Self-pay | Admitting: Nurse Practitioner

## 2014-11-24 VITALS — BP 110/62 | HR 72 | Ht 68.0 in | Wt 177.2 lb

## 2014-11-24 DIAGNOSIS — D259 Leiomyoma of uterus, unspecified: Secondary | ICD-10-CM | POA: Diagnosis not present

## 2014-11-24 DIAGNOSIS — Z01419 Encounter for gynecological examination (general) (routine) without abnormal findings: Secondary | ICD-10-CM

## 2014-11-24 DIAGNOSIS — Z0000 Encounter for general adult medical examination without abnormal findings: Secondary | ICD-10-CM | POA: Diagnosis not present

## 2014-11-24 DIAGNOSIS — N760 Acute vaginitis: Secondary | ICD-10-CM

## 2014-11-24 DIAGNOSIS — Z30431 Encounter for routine checking of intrauterine contraceptive device: Secondary | ICD-10-CM | POA: Diagnosis not present

## 2014-11-24 DIAGNOSIS — T839XXD Unspecified complication of genitourinary prosthetic device, implant and graft, subsequent encounter: Secondary | ICD-10-CM | POA: Diagnosis not present

## 2014-11-24 LAB — POCT URINALYSIS DIPSTICK
Leukocytes, UA: NEGATIVE
Urobilinogen, UA: NEGATIVE
pH, UA: 5

## 2014-11-24 LAB — HEMOGLOBIN, FINGERSTICK: Hemoglobin, fingerstick: 14.6 g/dL (ref 12.0–16.0)

## 2014-11-24 NOTE — Progress Notes (Signed)
39 y.o. G0P0 Single  Caucasian Fe here for NGYN annual exam.  Normal menses lasted for 7 dasys with menorhagia and dysmenorrhea.  She had evaluation with PUS and was found to have 3 uterine fibroids and endo biopsy was negative 08/2013.  She was then treated with a Mirena IUD  09/17/13.   Initially no menses for about three months.  But since then she has not felt well - complains of 'pelvic fulness', presure and dyspareniea.  Some / most of this is cyclic in nature but has not been tracking as to when it occurs.  Same partner for 15 years.  Also history of CIN II with negative colpo biopsy 2003 but paps normal since then.  She is quite concerned about the cause of pain - especially on the RLQ.  She did awaken one night and drew a picture of pain - the next day realized this pain was very low in the pelvis. For years has had chronic BV and yeast infections.  Patient's last menstrual period was 11/10/2014.          Sexually active: Yes.    The current method of family planning is Mirena IUD - insertion 09/17/13   Exercising: Yes.    ellipitcal, trainer, walking Smoker:  no  Health Maintenance: Pap:  03/20/2013 wnl ( history of CIN II 2003 - pap for 20 years) TDaP:  2007/2008 Labs: Hgb: 14.6 ; Urine: Negative   reports that she has never smoked. She has never used smokeless tobacco. She reports that she drinks alcohol. She reports that she does not use illicit drugs.  Past Medical History  Diagnosis Date  . MVP (mitral valve prolapse)   . Heart murmur   . Rubella immune 2010  . Chlamydia 2000  . Anxiety   . Depression   . Dyspareunia   . Fibroid   . 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 Wisdom tooth extraction  1999  . Root canal  07/2014    Current Outpatient Prescriptions  Medication Sig Dispense Refill  . Loratadine (CLARITIN PO) Take by mouth.      . Omeprazole (PRILOSEC PO) Take by mouth as needed.       Current Facility-Administered Medications  Medication Dose Route Frequency Provider Last Rate Last Dose  . levonorgestrel (MIRENA) 20 MCG/24HR IUD   Intrauterine Once Terrance Mass, MD        Family History  Problem Relation Age of Onset  . Breast cancer Maternal Aunt 23    x2 recurence age 32  . Hypertension Mother   . Anemia Mother   . Hypertension Maternal Grandmother   . Cancer Sister   . Heart attack Paternal Grandfather     ROS:  Pertinent items are noted in HPI.  Otherwise, a comprehensive ROS was negative.  Exam:   BP 110/62 mmHg  Pulse 72  Ht 5\' 8"  (1.727 m)  Wt 177 lb 3.2 oz (80.377 kg)  BMI 26.95 kg/m2  LMP 11/10/2014 Height: 5\' 8"  (172.7 cm) Ht Readings from Last 3 Encounters:  11/24/14 5\' 8"  (1.727 m)  03/20/13 5\' 7"  (1.702 m)  01/17/12 5' 8.5" (1.74 m)    General appearance: alert, cooperative and appears stated age Head: Normocephalic, without obvious abnormality, atraumatic Neck: no adenopathy, supple, symmetrical, trachea midline and thyroid normal to inspection and palpation Lungs: clear to auscultation bilaterally Breasts: normal appearance, no masses or  tenderness Heart: regular rate and rhythm Abdomen: soft, non-tender; no masses,  no organomegaly Extremities: extremities normal, atraumatic, no cyanosis or edema Skin: Skin color, texture, turgor normal. No rashes or lesions Lymph nodes: Cervical, supraclavicular, and axillary nodes normal. No abnormal inguinal nodes palpated Neurologic: Grossly normal   Pelvic: External genitalia:  no lesions              Urethra:  normal appearing urethra with no masses, tenderness or lesions              Bartholin's and Skene's: normal                 Vagina: normal appearing vagina with normal color and discharge, no lesions              Cervix: anteverted maybe the edge of IUD string -basically not able to see               Pap taken: Yes.   Bimanual Exam:  Uterus:  enlarged, 5-6 weeks weeks size               Adnexa: no mass, fullness, tenderness and positive for: tenderness on the right adnexa with fullness that feels like an ovarian cyst               Rectovaginal: Confirms               Anus:  normal sphincter tone, no lesions  Chaperone present: Yes  A:  Well Woman with normal exam  History of abnormal pap CIN II 2003 with multiple colpo biopsy  Needs pap yearly X 20  S/P Mirena IUD 09/17/13 - strings not easily visible  Persistent lower pelvic pain   P:   Reviewed health and wellness pertinent to exam  Pap smear as above  Will follow also with Affirm  Discussed other infections that can cause pelvic pain - but she is at risk - since she does have uterine fibroids and IUD - worth repeating PUS to confirm size and position.  Another course of treatment may be antibiotics for 2 weeks and recheck - she would like to proceed with PUS.  Counseled on breast self exam, adequate intake of calcium and vitamin D, diet and exercise return annually or prn  An After Visit Summary was printed and given to the patient.

## 2014-11-24 NOTE — Patient Instructions (Signed)

## 2014-11-25 LAB — WET PREP BY MOLECULAR PROBE
CANDIDA SPECIES: NEGATIVE
Gardnerella vaginalis: NEGATIVE
Trichomonas vaginosis: NEGATIVE

## 2014-11-26 ENCOUNTER — Telehealth: Payer: Self-pay | Admitting: Nurse Practitioner

## 2014-11-26 DIAGNOSIS — T839XXA Unspecified complication of genitourinary prosthetic device, implant and graft, initial encounter: Secondary | ICD-10-CM

## 2014-11-26 LAB — IPS PAP TEST WITH HPV

## 2014-11-26 NOTE — Telephone Encounter (Signed)
Call to patient to go over PUS benefit.  Patient has a mailbox that has not been set up yet.

## 2014-11-28 NOTE — Progress Notes (Signed)
Encounter reviewed by Dr. Brook Silva.  

## 2014-12-16 ENCOUNTER — Encounter: Payer: Self-pay | Admitting: Obstetrics and Gynecology

## 2014-12-16 ENCOUNTER — Ambulatory Visit (INDEPENDENT_AMBULATORY_CARE_PROVIDER_SITE_OTHER): Payer: Managed Care, Other (non HMO) | Admitting: Obstetrics and Gynecology

## 2014-12-16 ENCOUNTER — Ambulatory Visit (INDEPENDENT_AMBULATORY_CARE_PROVIDER_SITE_OTHER): Payer: Managed Care, Other (non HMO)

## 2014-12-16 VITALS — BP 100/74 | HR 44 | Ht 68.0 in | Wt 172.0 lb

## 2014-12-16 DIAGNOSIS — N760 Acute vaginitis: Secondary | ICD-10-CM

## 2014-12-16 DIAGNOSIS — M25559 Pain in unspecified hip: Secondary | ICD-10-CM | POA: Diagnosis not present

## 2014-12-16 DIAGNOSIS — D251 Intramural leiomyoma of uterus: Secondary | ICD-10-CM

## 2014-12-16 DIAGNOSIS — R102 Pelvic and perineal pain: Secondary | ICD-10-CM | POA: Diagnosis not present

## 2014-12-16 DIAGNOSIS — T839XXA Unspecified complication of genitourinary prosthetic device, implant and graft, initial encounter: Secondary | ICD-10-CM

## 2014-12-16 MED ORDER — CIPROFLOXACIN HCL 500 MG PO TABS: 500.0000 mg | ORAL_TABLET | Freq: Two times a day (BID) | ORAL | Status: DC

## 2014-12-16 MED ORDER — FLUCONAZOLE 150 MG PO TABS: 150.0000 mg | ORAL_TABLET | Freq: Once | ORAL | Status: DC

## 2014-12-16 NOTE — Progress Notes (Signed)
Subjective  39 y.o. G0P0  female here for pelvic ultrasound for ultrasound to check IUD position and to evaluate for menorrhagia, dysmenorrhea, and pelvic fullness and pressure.  Had episode of pain in bilateral abdomen, right greater than left.  Some constipation feeling also.  Pain is generally better now.  Mirena IUD placed in 2015.  Had pain for months after IUD placement.  Had mucousy blood last month for her cycle. Cycles fluctuate but in general are improving.   C/o vaginal discharge and odor.  Negative Affirm on 11/29/14. Hx of vaginitis.  Feels dry.   Wants Rx for potential UTI and yeast infection while she travels to [place].   Objective  Pelvic ultrasound images and report reviewed with patient.  Uterus - 3.6 cm left intramural fibroid.  IUD in endometrial canal - proper position. EMS - 4.78 mm. Ovaries - 1.5 cm collapsed left CL cyst. Free fluid -  3 x 3 cm.      Pelvic exam - normal external genitalia and urethra.  Cervix - no IUD strings seen.  Mucousy discharge noted - yellow. Uterus with irregular shape and 6 week size, nontender.  No adnexal masses or tenderness.  Assessment  Uterine fibroid.  Mirena IUD patient.  Pelvic pain.  Possible ruptured ovarian cyst.  Vaginitis.  Desire for UTI Rx and vaginitis Rx.  Plan  Discussion of ovarian cysts and uterine fibroid.  Discussion of possible ovulation with Mirena in place.  Discussion of vaginitis.  Affirm done.  Rx for Cipro and Diflucan for travel.   __25_____ minutes face to face time of which over 50% was spent in counseling.   After visit summary to patient.

## 2014-12-17 LAB — WET PREP BY MOLECULAR PROBE
CANDIDA SPECIES: NEGATIVE
Gardnerella vaginalis: NEGATIVE
Trichomonas vaginosis: NEGATIVE

## 2015-11-28 ENCOUNTER — Encounter: Payer: Self-pay | Admitting: Nurse Practitioner

## 2015-11-28 ENCOUNTER — Ambulatory Visit (INDEPENDENT_AMBULATORY_CARE_PROVIDER_SITE_OTHER): Payer: Managed Care, Other (non HMO) | Admitting: Nurse Practitioner

## 2015-11-28 VITALS — BP 100/60 | HR 52 | Resp 12 | Ht 67.25 in | Wt 178.8 lb

## 2015-11-28 DIAGNOSIS — Z0000 Encounter for general adult medical examination without abnormal findings: Secondary | ICD-10-CM | POA: Diagnosis not present

## 2015-11-28 DIAGNOSIS — Z975 Presence of (intrauterine) contraceptive device: Secondary | ICD-10-CM | POA: Diagnosis not present

## 2015-11-28 DIAGNOSIS — Z01419 Encounter for gynecological examination (general) (routine) without abnormal findings: Secondary | ICD-10-CM | POA: Diagnosis not present

## 2015-11-28 NOTE — Progress Notes (Signed)
40 y.o. G0P0 Single  Caucasian Fe here for annual exam.  Usually spotting with cycle or cramps.  Feels like pain in lower pelvis with cycle is "still there".  Pain is located at ovaries bilaterally.   This type of pain at been there since insertion of Mirena IUD 09/17/13.   Last year seen by Dr. Quincy Simmonds for this pelvic discomfort and PUS showed the IUD in place, 1.5 cm collapsed left OV cyst.  She is still just concerned as the pain has not improved over time.    Before IUD menses was very heavy and flow 6-7 days. She is not wanting pregnancy ever so thinking maybe something surgical can be done to help with these symptoms.  No pain with SA.  There have been some bowel changes with semi soft BM's over the past 2 years.  They occur daily X 1 but has no form to them.  Not related to types of food as this is her "normal" now.  Patient's last menstrual period was 11/07/2015. Pt states is starting heavier menses.          Sexually active: Yes.    The current method of family planning is IUD.    Exercising: Yes.    Gym, treadmill, gardening Smoker:  no  Health Maintenance: Pap:  11/24/14-WNL neg HR HPV TDaP:  2009 HIV: 15 years ago    reports that she has never smoked. She has never used smokeless tobacco. She reports that she drinks about 3.0 - 4.2 oz of alcohol per week. She reports that she does not use illicit drugs.  Past Medical History  Diagnosis Date  . MVP (mitral valve prolapse)   . Heart murmur   . Rubella immune 2010  . Chlamydia 2000  . Anxiety   . Depression   . Dyspareunia   . Fibroid   . 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 Wisdom tooth extraction  1999  . Root canal  07/2014    Current Outpatient Prescriptions  Medication Sig Dispense Refill  . fluconazole (DIFLUCAN) 150 MG tablet Take 1 tablet (150 mg total) by mouth once. Repeat in 48 hours if systems persist. 2 tablet 0  . Loratadine (CLARITIN  PO) Take by mouth.      . Omeprazole (PRILOSEC PO) Take by mouth as needed.      Current Facility-Administered Medications  Medication Dose Route Frequency Provider Last Rate Last Dose  . levonorgestrel (MIRENA) 20 MCG/24HR IUD   Intrauterine Once Terrance Mass, MD        Family History  Problem Relation Age of Onset  . Breast cancer Maternal Aunt 35    x2 recurence age 82  . Hypertension Mother   . Anemia Mother   . Hypertension Maternal Grandmother   . Cancer Sister   . Heart attack Paternal Grandfather     ROS:  Pertinent items are noted in HPI.  Otherwise, a comprehensive ROS was negative.  Exam:   BP 100/60 mmHg  Pulse 52  Resp 12  Ht 5' 7.25" (1.708 m)  Wt 178 lb 12.8 oz (81.103 kg)  BMI 27.80 kg/m2  LMP 11/07/2015 Height: 5' 7.25" (170.8 cm) Ht Readings from Last 3 Encounters:  11/28/15 5' 7.25" (1.708 m)  12/16/14 5\' 8"  (1.727 m)  11/24/14 5\' 8"  (1.727 m)    General appearance: alert, cooperative and appears stated age Head: Normocephalic, without  obvious abnormality, atraumatic Neck: no adenopathy, supple, symmetrical, trachea midline and thyroid normal to inspection and palpation Lungs: clear to auscultation bilaterally Breasts: normal appearance, no masses or tenderness Heart: regular rate and rhythm Abdomen: soft, non-tender; no masses,  no organomegaly Extremities: extremities normal, atraumatic, no cyanosis or edema Skin: Skin color, texture, turgor normal. No rashes or lesions Lymph nodes: Cervical, supraclavicular, and axillary nodes normal. No abnormal inguinal nodes palpated Neurologic: Grossly normal   Pelvic: External genitalia:  no lesions              Urethra:  normal appearing urethra with no masses, tenderness or lesions              Bartholin's and Skene's: normal                 Vagina: normal appearing vagina with normal color and discharge, no lesions              Cervix: anteverted  IUD string not visible              Pap taken:  No. 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 Quincy Simmonds for further recommendations  Counseled on breast self exam, adequate intake of calcium and vitamin D, diet and exercise return annually or prn  An After Visit Summary was printed and given to the patient.

## 2015-11-28 NOTE — Patient Instructions (Signed)

## 2015-11-29 NOTE — Progress Notes (Signed)
Encounter reviewed by Dr. Aundria Rud. I recommend a follow up visit for evaluation and treatment options for pelvic pain.

## 2015-12-13 ENCOUNTER — Other Ambulatory Visit: Payer: Self-pay | Admitting: Nurse Practitioner

## 2015-12-13 ENCOUNTER — Telehealth: Payer: Self-pay | Admitting: Nurse Practitioner

## 2015-12-13 DIAGNOSIS — R198 Other specified symptoms and signs involving the digestive system and abdomen: Secondary | ICD-10-CM

## 2015-12-13 NOTE — Telephone Encounter (Signed)
Patient is called with an update on her lower pelvic pain. We are planning on her returning for OV with Dr. Quincy Simmonds.  But also planning for GI consult with Dr. Collene Mares.  Orders are placed and apt is made.

## 2015-12-15 ENCOUNTER — Ambulatory Visit: Payer: Managed Care, Other (non HMO) | Admitting: Obstetrics and Gynecology

## 2015-12-19 ENCOUNTER — Encounter: Payer: Self-pay | Admitting: Obstetrics and Gynecology

## 2015-12-19 ENCOUNTER — Ambulatory Visit (INDEPENDENT_AMBULATORY_CARE_PROVIDER_SITE_OTHER): Payer: Managed Care, Other (non HMO) | Admitting: Obstetrics and Gynecology

## 2015-12-19 VITALS — BP 110/66 | HR 50 | Ht 67.25 in | Wt 178.6 lb

## 2015-12-19 DIAGNOSIS — Z308 Encounter for other contraceptive management: Secondary | ICD-10-CM

## 2015-12-19 DIAGNOSIS — M25559 Pain in unspecified hip: Secondary | ICD-10-CM

## 2015-12-19 NOTE — Patient Instructions (Addendum)
Please check out [email or web address] or [email or web address] for good medical information.  Leuprolide depot injection What is this medicine? LEUPROLIDE (loo PROE lide) is a man-made protein that acts like a natural hormone in the body. It decreases testosterone in men and decreases estrogen in women. In men, this medicine is used to treat advanced prostate cancer. In women, some forms of this medicine may be used to treat endometriosis, uterine fibroids, or other female hormone-related problems. This medicine may be used for other purposes; ask your health care provider or pharmacist if you have questions. What should I tell my health care provider before I take this medicine? They need to know if you have any of these conditions: -diabetes -heart disease or previous heart attack -high blood pressure -high cholesterol -osteoporosis -pain or difficulty passing urine -spinal cord metastasis -stroke -tobacco smoker -unusual vaginal bleeding (women) -an unusual or allergic reaction to leuprolide, benzyl alcohol, other medicines, foods, dyes, or preservatives -pregnant or trying to get pregnant -breast-feeding How should I use this medicine? This medicine is for injection into a muscle or for injection under the skin. It is given by a health care professional in a hospital or clinic setting. The specific product will determine how it will be given to you. Make sure you understand which product you receive and how often you will receive it. Talk to your pediatrician regarding the use of this medicine in children. Special care may be needed. Overdosage: If you think you have taken too much of this medicine contact a poison control center or emergency room at once. NOTE: This medicine is only for you. Do not share this medicine with others. What if I miss a dose? It is important not to miss a dose. Call your doctor or health care professional if you are unable to keep an appointment. Depot injections: Depot  injections are given either once-monthly, every 12 weeks, every 16 weeks, or every 24 weeks depending on the product you are prescribed. The product you are prescribed will be based on if you are female or female, and your condition. Make sure you understand your product and dosing. What may interact with this medicine? Do not take this medicine with any of the following medications: -chasteberry This medicine may also interact with the following medications: -herbal or dietary supplements, like black cohosh or DHEA -female hormones, like estrogens or progestins and birth control pills, patches, rings, or injections -female hormones, like testosterone This list may not describe all possible interactions. Give your health care provider a list of all the medicines, herbs, non-prescription drugs, or dietary supplements you use. Also tell them if you smoke, drink alcohol, or use illegal drugs. Some items may interact with your medicine. What should I watch for while using this medicine? Visit your doctor or health care professional for regular checks on your progress. During the first weeks of treatment, your symptoms may get worse, but then will improve as you continue your treatment. You may get hot flashes, increased bone pain, increased difficulty passing urine, or an aggravation of nerve symptoms. Discuss these effects with your doctor or health care professional, some of them may improve with continued use of this medicine. Female patients may experience a menstrual cycle or spotting during the first months of therapy with this medicine. If this continues, contact your doctor or health care professional. What side effects may I notice from receiving this medicine? Side effects that you should report to your doctor or health care professional as soon as possible: -  allergic reactions like skin rash, itching or hives, swelling of the face, lips, or tongue -breathing problems -chest pain -depression or  memory disorders -pain in your legs or groin -pain at site where injected or implanted -severe headache -swelling of the feet and legs -visual changes -vomiting Side effects that usually do not require medical attention (report to your doctor or health care professional if they continue or are bothersome): -breast swelling or tenderness -decrease in sex drive or performance -diarrhea -hot flashes -loss of appetite -muscle, joint, or bone pains -nausea -redness or irritation at site where injected or implanted -skin problems or acne This list may not describe all possible side effects. Call your doctor for medical advice about side effects. You may report side effects to FDA at 1-800-FDA-1088. Where should I keep my medicine? This drug is given in a hospital or clinic and will not be stored at home. NOTE: This sheet is a summary. It may not cover all possible information. If you have questions about this medicine, talk to your doctor, pharmacist, or health care provider.    2016, Elsevier/Gold Standard. (2014-03-05 14:16:23)  Diagnostic Laparoscopy A diagnostic laparoscopy is a procedure to diagnose diseases in the abdomen. During the procedure, a thin, lighted, pencil-sized instrument called a laparoscope is inserted into the abdomen through an incision. The laparoscope allows your health care provider to look at the organs inside your body. LET [facility] CARE PROVIDER KNOW ABOUT:  Any allergies you have.  All medicines you are taking, including vitamins, herbs, eye drops, creams, and over-the-counter medicines.  Previous problems you or members of your family have had with the use of anesthetics.  Any blood disorders you have.  Previous surgeries you have had.  Medical conditions you have. RISKS AND COMPLICATIONS  Generally, this is a safe procedure. However, problems can occur, which may include:  Infection.  Bleeding.  Damage to other organs.  Allergic reaction  to the anesthetics used during the procedure. BEFORE THE PROCEDURE  Do not eat or drink anything after midnight on the night before the procedure or as directed by your health care provider.  Ask your health care provider about:  Changing or stopping your regular medicines.  Taking medicines such as aspirin and ibuprofen. These medicines can thin your blood. Do not take these medicines before your procedure if your health care provider instructs you not to.  Plan to have someone take you home after the procedure. PROCEDURE  You may be given a medicine to help you relax (sedative).  You will be given a medicine to make you sleep (general anesthetic).  Your abdomen will be inflated with a gas. This will make your organs easier to see.  Small incisions will be made in your abdomen.  A laparoscope and other small instruments will be inserted into the abdomen through the incisions.  A tissue sample may be removed from an organ in the abdomen for examination.  The instruments will be removed from the abdomen.  The gas will be released.  The incisions will be closed with stitches (sutures). AFTER THE PROCEDURE  Your blood pressure, heart rate, breathing rate, and blood oxygen level will be monitored often until the medicines you were given have worn off.   This information is not intended to replace advice given to you by your health care provider. Make sure you discuss any questions you have with your health care provider.   Document Released: 09/17/2000 Document Revised: 03/02/2015 Document Reviewed: 01/22/2014 Elsevier Interactive Patient  Education 2016 [employer].

## 2015-12-19 NOTE — Progress Notes (Signed)
Patient ID: Caroline Hayes, female   DOB: August 10, 1975, 40 y.o.   MRN: [id number] GYNECOLOGY  VISIT   HPI: 40 y.o.   Single  Caucasian  female   G0P0 with Patient's last menstrual period was 12/13/2015.   here for bilateral pelvic pain monthly for one year.  Patient states pain feels like "premenstrual pain".  Patient declines fever, nausea or bowel changes. Pain is premenstrual on either side. Starts in the ovarian area and radiates from there. No relation to bowel or bladder function.  Not related to physical activity.  No real pain medication use.   Menses are more like "sludge."   Menses can be painful now.   No new partners.  Sex is not painful.  Pain before the IUD placement was overwhelming.  Was on Necon at that time. Pain now is so much better in compared to that. Used pills in the past for almost 16 years. Yaz and NuvaRing causes significant emotional changes.  Patient is asking about contraceptive methods and if they cause abortion.   Last pelvic ultrasound June 2016: Uterus - 3.6 cm left intramural fibroid. IUD in endometrial canal - proper position. EMS - 4.78 mm. Ovaries - 1.5 cm collapsed left CL cyst. Free fluid - 3 x 3 cm.  GYNECOLOGIC HISTORY: Patient's last menstrual period was 12/13/2015. Contraception:  Mirena IUD inserted 09-17-13 Menopausal hormone therapy:  none Last mammogram:  n/a Last pap smear:   11-24-14 Neg:Neg HR HPV        OB History    Gravida Para Term Preterm AB TAB SAB Ectopic Multiple Living   0                  Patient Active Problem List   Diagnosis Date Noted  . IUD (intrauterine device) in place 09/17/2013  . DUB (dysfunctional uterine bleeding) 09/02/2013  . MVP (mitral valve prolapse) 01/17/2012  . Dysplasia of cervix, 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    Past Medical History  Diagnosis Date  . MVP (mitral valve prolapse)   . Heart murmur   . Rubella immune 2010  . Chlamydia 2000  . Anxiety   . Depression   . Dyspareunia    . Fibroid   . 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 Wisdom tooth extraction  1999  . Root canal  07/2014    Current Outpatient Prescriptions  Medication Sig Dispense Refill  . Loratadine (CLARITIN PO) Take 1 tablet by mouth as needed.     . Omeprazole (PRILOSEC PO) Take by mouth as needed.      Current Facility-Administered Medications  Medication Dose Route Frequency Provider Last Rate Last Dose  . levonorgestrel (MIRENA) 20 MCG/24HR IUD   Intrauterine Once Terrance Mass, MD         ALLERGIES: Barley green; Eggs or egg-derived products; Ginger; and Imitrex  Family History  Problem Relation Age of Onset  . Breast cancer Maternal Aunt 2    x2 recurence age 19  . Hypertension Mother   . Anemia Mother   . Hypertension Maternal Grandmother   . Cancer Sister   . Heart attack Paternal Grandfather     Social History   Social History  . Marital Status: Single    Spouse Name: N/A  . Number of Children: N/A  . Years of Education: N/A   Occupational History  . Not on file.  Social History Main Topics  . Smoking status: Never Smoker   . Smokeless tobacco: Never Used  . Alcohol Use: 3.0 - 4.2 oz/week    5-7 Standard drinks or equivalent per week     Comment: sometimes  . Drug Use: No  . Sexual Activity:    Partners: Male    Birth Control/ Protection: IUD     Comment: Mirena- Inserted 09-17-13   Other Topics Concern  . Not on file   Social History Narrative    ROS:  Pertinent items are noted in HPI.  PHYSICAL EXAMINATION:    BP 110/66 mmHg  Pulse 50  Ht 5' 7.25" (1.708 m)  Wt 178 lb 9.6 oz (81.012 kg)  BMI 27.77 kg/m2  LMP 12/13/2015    General appearance: alert, cooperative and appears stated age   Abdomen:  soft, non-tender, no masses,  no organomegaly    Pelvic: External genitalia:  no lesions              Urethra:  normal appearing urethra with no masses, tenderness or  lesions              Bartholins and Skenes: normal                 Vagina: normal appearing vagina with normal color and discharge, no lesions              Cervix: no lesions.  IUD strings not seen.                  Bimanual Exam:  Uterus:  normal size, contour, position, consistency, mobility, non-tender              Adnexa: normal adnexa and no mass, fullness, tenderness                Chaperone was present for exam.  ASSESSMENT  Contraceptive care.  Unable to see IUD strings which is not new.  Prior U/S confirms placement.  Pelvic pain and ysmenorrhea controlled well with Mirena IUD.  I suspect patient may have endometriosis.  PLAN  Discussion of how contraception works.  Also given referral to [email or web address] and [email or web address] for further information.  Discussion of pelvic pain and how endometriosis is diagnosed through surgical care and biopsy.  Discussion of options for treatment of pelvic pain/dysmenorrhea including adding a combined oral contraception to her current regimen for 3 months, discussed switching to Depo Provera, course of Depo Lupron, or surgical care - laparoscopy with treatment of potential endometriosis or hysterectomy.   Patient states she will consider her options, but can live with what she is dealing with as it is better than in the past.  GC/CT performed with patient's permission.  An After Visit Summary was printed and given to the patient.  _25____ minutes face to face time of which over 50% was spent in counseling.

## 2015-12-20 LAB — GC/CHLAMYDIA PROBE AMP
CT PROBE, AMP APTIMA: NOT DETECTED
GC Probe RNA: NOT DETECTED

## 2015-12-21 ENCOUNTER — Telehealth: Payer: Self-pay | Admitting: Nurse Practitioner

## 2015-12-21 NOTE — Telephone Encounter (Signed)
Left message to call Kaitlyn at [phone]. 

## 2015-12-21 NOTE — Telephone Encounter (Signed)
Patient wants to talk with the nurse. No information given °

## 2015-12-29 NOTE — Telephone Encounter (Signed)
Dr. Collene Mares called requesting to speak with Kem Boroughs, FNP about this patient. Requests call back on Dr. Lorie Apley cell at: [phone].  Routing to [employer]

## 2015-12-30 NOTE — Telephone Encounter (Signed)
I have disused the bowel changes with Dr. Collene Mares.  She stated to Dr. Collene Mares there were no changes with bowel habits. Where on our AEX she stated there were bowel changes for the past 2 yrs of being soft to semi soft.  Dr. Collene Mares will call her back and verify.  But at this time no further workup is indicated. She will send [place] a note.  Dr. Quincy Simmonds was going to see her back in reference to the pelvic pain. Not sure when apt is set.

## 2016-01-02 NOTE — Telephone Encounter (Signed)
Noted message from Dr. Quincy Simmonds.  Encounter is closed.

## 2016-01-02 NOTE — Telephone Encounter (Signed)
Dr. Quincy Simmonds,  Can you review and advise. Office visit with patient 12/19/15. Does she need follow up scheduled?

## 2016-01-02 NOTE — Telephone Encounter (Signed)
No further follow up with me was recommended.  I have already seen and evaluated the patient.  She has a Mirena IUD and is satisfied with her control of dysmenorrhea.

## 2016-11-27 NOTE — Progress Notes (Signed)
41 y.o. G0P0 Single  Caucasian Fe here for annual exam. While visiting in [place] on vacation; had a normal cycle for 2 days, then had very heavy flow at 12 hours.  She  then went to ER on 11/21/16.   At that time the flow was very heavy - even bleeding across the floor.  Had very bad cramps all through the night that was some better after the "bulk of flow".  ER MD did not see IUD strings -  but this is her normal as the Mirena IUD strings are cut short.  She had PUS with findings of fibroids and 'thinks' the IUD was in place, normal labs, and given medication to stop the flow which was Spotof 500 mg ( here the med is Exacyl) X 5 days. After medication the flow started to slow on day 3 and now only very light brown tinged.  Ibuprofen 200 mg was taken for 3 days.  Usually with IUD will have menses from 3-5 days with sometimes heavy flow, most of time light to moderate.  Previous PUS was done in 2016 showing IUD strings in the canal.  The Mirena IUD was inserted 09/17/13.  Patient's last menstrual period was 11/18/2016.          Sexually active: Yes.    The current method of family planning is IUD.    Exercising: No.  The patient does not participate in regular exercise at present. 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 negative Self Breast exams: no TDaP:  2009 Hep C and HIV: Yes Labs: If needed.    reports that she has never smoked. She has never used smokeless tobacco. She reports that she drinks about 3.0 - 4.2 oz of alcohol per week . She reports that she does not use drugs.  Past Medical History:  Diagnosis Date  . Anxiety   . Chlamydia 2000  . Depression   . Dyspareunia   . Fibroid   . Heart murmur   . 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 MVP (mitral valve prolapse)   . Rubella immune 2010    Past Surgical History:  Procedure Laterality  Date  . ROOT CANAL  07/2014  . WISDOM TOOTH EXTRACTION  1999    Current Outpatient Prescriptions  Medication Sig Dispense Refill  . Loratadine (CLARITIN PO) Take 1 tablet by mouth as needed.     . Omeprazole (PRILOSEC PO) Take by mouth as needed.      Current Facility-Administered Medications  Medication Dose Route Frequency Provider Last Rate Last Dose  . levonorgestrel (MIRENA) 20 MCG/24HR IUD   Intrauterine Once Terrance Mass, MD        Family History  Problem Relation Age of Onset  . Breast cancer Maternal Aunt 73       x2 recurence age 62  . Hypertension Mother   . Anemia Mother   . Hypertension Maternal Grandmother   . Cancer Sister   . Heart attack Paternal Grandfather     ROS:  Pertinent items are noted in HPI.  Otherwise, a comprehensive ROS was negative.  Exam:   BP 114/72 (BP Location: Right Arm, Patient Position: Sitting, Cuff Size: Normal)   Pulse 60   Resp 12   Ht 5' 7.5" (1.715 m)   Wt 177 lb (80.3 kg)  LMP 11/18/2016   BMI 27.31 kg/m  Height: 5' 7.5" (171.5 cm) Ht Readings from Last 3 Encounters:  11/28/16 5' 7.5" (1.715 m)  12/19/15 5' 7.25" (1.708 m)  11/28/15 5' 7.25" (1.708 m)    General appearance: alert, cooperative and appears stated age Head: Normocephalic, without obvious abnormality, atraumatic Neck: no adenopathy, supple, symmetrical, trachea midline and thyroid normal to inspection and palpation Lungs: clear to auscultation bilaterally Breasts: normal appearance, no masses or tenderness Heart: regular rate and rhythm Abdomen: soft, non-tender; no masses,  no organomegaly Extremities: extremities normal, atraumatic, no cyanosis or edema Skin: Skin color, texture, turgor normal. No rashes or lesions Lymph nodes: Cervical, supraclavicular, and axillary nodes normal. No abnormal inguinal nodes palpated Neurologic: Grossly normal   Pelvic: External genitalia:  no lesions              Urethra:  normal appearing urethra with no  masses, tenderness or lesions              Bartholin's and Skene's: normal                 Vagina: normal appearing vagina with normal color and discharge, no lesions              Cervix: anteverted - IUD strings not visible              Pap taken: Yes.   Bimanual Exam:  Uterus:  normal size, contour, position, consistency, mobility, non-tender              Adnexa: no mass, fullness, tenderness               Rectovaginal: Confirms               Anus:  normal sphincter tone, no lesions  Chaperone present: yes  A:  Well Woman with normal exam  Mirena IUD for contraception insertion 09/17/13 - IUD strings not visible  Recent heavy AUB while on vacation in [place] 11/21/16 and sought care there.  Needs follow up.  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   Again abnormal pap ?2009 with colpo which was negative   P:   Reviewed health and wellness pertinent to exam  Pap smear: yes  Discussion about repeating PUS here and following through for this episode of menorrhagia - she is in agreement but can not have this tomorrow as she is traveling for work - will try to get this next Thursday.  She is aware to call ASAP if this kind of bleeding were to occur again in the interim.  I have discussed her care with Dr. Quincy Simmonds who has also been following her.  Counseled on breast self exam, adequate intake of calcium and vitamin D, diet and exercise return annually or prn  An After Visit Summary was printed and given to the patient.

## 2016-11-28 ENCOUNTER — Other Ambulatory Visit (HOSPITAL_COMMUNITY)
Admission: RE | Admit: 2016-11-28 | Discharge: 2016-11-28 | Disposition: A | Discharge status: Home / Self Care | Payer: Managed Care, Other (non HMO) | Source: Ambulatory Visit | Attending: Nurse Practitioner | Admitting: Nurse Practitioner

## 2016-11-28 ENCOUNTER — Ambulatory Visit (INDEPENDENT_AMBULATORY_CARE_PROVIDER_SITE_OTHER): Payer: Managed Care, Other (non HMO) | Admitting: Nurse Practitioner

## 2016-11-28 ENCOUNTER — Telehealth: Payer: Self-pay | Admitting: Nurse Practitioner

## 2016-11-28 ENCOUNTER — Encounter: Payer: Self-pay | Admitting: Nurse Practitioner

## 2016-11-28 ENCOUNTER — Ambulatory Visit: Payer: Managed Care, Other (non HMO) | Admitting: Nurse Practitioner

## 2016-11-28 VITALS — BP 114/72 | HR 60 | Resp 12 | Ht 67.5 in | Wt 177.0 lb

## 2016-11-28 DIAGNOSIS — Z975 Presence of (intrauterine) contraceptive device: Secondary | ICD-10-CM

## 2016-11-28 DIAGNOSIS — N920 Excessive and frequent menstruation with regular cycle: Secondary | ICD-10-CM | POA: Diagnosis not present

## 2016-11-28 DIAGNOSIS — Z01419 Encounter for gynecological examination (general) (routine) without abnormal findings: Secondary | ICD-10-CM

## 2016-11-28 DIAGNOSIS — Z0000 Encounter for general adult medical examination without abnormal findings: Secondary | ICD-10-CM | POA: Diagnosis not present

## 2016-11-28 NOTE — Telephone Encounter (Signed)
Called patient to review benefits for a recommended ultrasound. Left Voicemail requesting a call back. °

## 2016-11-28 NOTE — Patient Instructions (Signed)

## 2016-11-29 ENCOUNTER — Other Ambulatory Visit: Payer: Self-pay | Admitting: *Deleted

## 2016-11-29 ENCOUNTER — Other Ambulatory Visit: Payer: Self-pay | Admitting: Certified Nurse Midwife

## 2016-11-29 DIAGNOSIS — N920 Excessive and frequent menstruation with regular cycle: Secondary | ICD-10-CM

## 2016-11-29 DIAGNOSIS — E559 Vitamin D deficiency, unspecified: Secondary | ICD-10-CM

## 2016-11-29 LAB — CBC
Hematocrit: 40.9 % (ref 34.0–46.6)
Hemoglobin: 13.5 g/dL (ref 11.1–15.9)
MCH: 30.5 pg (ref 26.6–33.0)
MCHC: 33 g/dL (ref 31.5–35.7)
MCV: 92 fL (ref 79–97)
Platelets: 225 10*3/uL (ref 150–379)
RBC: 4.43 x10E6/uL (ref 3.77–5.28)
RDW: 13.9 % (ref 12.3–15.4)
WBC: 7.4 10*3/uL (ref 3.4–10.8)

## 2016-11-29 LAB — VITAMIN D 25 HYDROXY (VIT D DEFICIENCY, FRACTURES): Vit D, 25-Hydroxy: 19 ng/mL — ABNORMAL LOW (ref 30.0–100.0)

## 2016-11-29 NOTE — Telephone Encounter (Signed)
-----   Message from Regina Eck, CNM sent at 11/29/2016  8:14 AM EDT ----- Notify patient that her recent complete blood count showed no anemia and was normal. This is good news with the heavy bleeding that she had experienced. She needs to keep appointment for PUS for evaluation as discussed with her by [facility]. Vitamin D level is low at 19. She will need to start on OTC Vitamin D 3 daily and recheck in 3 months. Please schedule order placed

## 2016-11-29 NOTE — Telephone Encounter (Signed)
lmtcb to give lap results. Pt needs to be given results & speak to [facility] to be given u/s benefits

## 2016-11-29 NOTE — Progress Notes (Signed)
Encounter reviewed by Dr. Lakeshia Dohner Amundson C. Silva.  

## 2016-11-30 LAB — CYTOLOGY - PAP
Diagnosis: NEGATIVE
HPV (WINDOPATH): NOT DETECTED

## 2016-12-04 NOTE — Telephone Encounter (Signed)
Pt given lab results. Pt still needs to speak to [place] about benefits.

## 2016-12-05 NOTE — Progress Notes (Signed)
GYNECOLOGY  VISIT   HPI: 41 y.o.   Single  Caucasian  female   G0P0 with Patient's last menstrual period was 11/18/2016.   here for  Ultrasound for heavy bleeding episode.   Usually has a monthly menses with her Mirena IUD.  Episode of heavy bleeding and pain while traveling in [place].  Felt like a faucet opened up.  She states she would have a big cramp and then a rush of blood, and this pattern repeated itself. [place] there showed fibroids. States she had a negative pregnancy test.  Given Lysteda? when she was seen for emergency care. The medication did work.  Hgb 13.5 on 11/28/16 at her office visit here.  Has Mirena IUD and strings are known to be short.  IUD placed 09/17/13. Has a known fibroid by [place] in 2016.   Did see Dr. Collene Mares for pelvic pain in past, and she did not do a colonoscopy.   States she may have migraines but was never really diagnosed with this formally.  No photophobia. Aleve relieves them.  No aura.   Stopped oral contraceptive pills for many years.  NuvaRing caused breast tenderness. Used Yaz for short periods of time and had breast tenderness and anger.  Used Lo-Ovral and did well on this.   Not a smoker.  No HTN.  Declines childbearing.   GYNECOLOGIC HISTORY: Patient's last menstrual period was 11/18/2016. 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 negative        OB History    Gravida Para Term Preterm AB Living   0             SAB TAB Ectopic Multiple Live Births                     Patient Active Problem List   Diagnosis Date Noted  . IUD (intrauterine device) in place 09/17/2013  . DUB (dysfunctional uterine bleeding) 09/02/2013  . MVP (mitral valve prolapse) 01/17/2012  . Dysplasia of cervix, 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    Past  Medical History:  Diagnosis Date  . Anxiety   . Chlamydia 2000  . Depression   . Dyspareunia   . Fibroid   . Heart murmur   . 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 MVP (mitral valve prolapse)   . Rubella immune 2010    Past Surgical History:  Procedure Laterality Date  . ROOT CANAL  07/2014  . WISDOM TOOTH EXTRACTION  1999    Current Outpatient Prescriptions  Medication Sig Dispense Refill  . Loratadine (CLARITIN PO) Take 1 tablet by mouth as needed.     . Omeprazole (PRILOSEC PO) Take by mouth as needed.      Current Facility-Administered Medications  Medication Dose Route Frequency Provider Last Rate Last Dose  . levonorgestrel (MIRENA) 20 MCG/24HR IUD   Intrauterine Once Terrance Mass, MD         ALLERGIES: Barley green; Eggs or egg-derived products; Ginger; and Imitrex [sumatriptan base]  Family History  Problem Relation Age of Onset  . Hypertension Mother   . Anemia Mother   . Hypertension Maternal Grandmother   . Cancer Sister   . Heart attack Paternal Grandfather   .  Breast cancer Maternal Aunt 38       x2 recurence age 32    Social History   Social History  . Marital status: Single    Spouse name: N/A  . Number of children: N/A  . Years of education: N/A   Occupational History  . Not on file.   Social History Main Topics  . Smoking status: Never Smoker  . Smokeless tobacco: Never Used  . Alcohol use 3.0 - 4.2 oz/week    5 - 7 Standard drinks or equivalent per week     Comment: sometimes  . Drug use: No  . Sexual activity: Yes    Partners: Male    Birth control/ protection: IUD     Comment: Mirena- Inserted 09-17-13   Other Topics Concern  . Not on file   Social History Narrative  . No narrative on file    ROS:  Pertinent items are noted in HPI.  PHYSICAL EXAMINATION:    BP 110/60 (BP Location: Right Arm, Patient Position: Sitting, Cuff Size: Normal)   Pulse 84   Resp 16   Wt 178 lb (80.7 kg)   LMP  11/18/2016   BMI 27.47 kg/m     General appearance: alert, cooperative and appears stated age  Pelvic ultrasound: Intramural fibroid - 48 mm.  IUD in endometrial canal.  Fibroid does distort the cavity.  Ovaries normal with a right CL cyst.  Mild free fluid.  ASSESSMENT  Uterine fibroid - intramural - slightly increased in size since 2016.  Symptomatic. Mirena IUD patient.  Dysmenorrhea. Nausea with some COCs.  Migraines without aura.  PLAN  We discussed options for tx of the fibroid - oral contraceptive - COC or POP, Depo Provera, myomectomy, uterine artery embolization, hysterectomy.  We will add LoLoEstrin to her current regimen.  If this causes nausea, will try Camilla. Lysteda may help the bleeding, but it will not help her pain, so I favor a contraceptive.  Follow up in 3 months for a recheck.  Patient states she is also due to have vit D rechecked then.   An After Visit Summary was printed and given to the patient.  __25____ minutes face to face time of which over 50% was spent in counseling.

## 2016-12-06 ENCOUNTER — Ambulatory Visit (INDEPENDENT_AMBULATORY_CARE_PROVIDER_SITE_OTHER): Payer: Managed Care, Other (non HMO) | Admitting: Obstetrics and Gynecology

## 2016-12-06 ENCOUNTER — Encounter: Payer: Self-pay | Admitting: Obstetrics and Gynecology

## 2016-12-06 ENCOUNTER — Ambulatory Visit (INDEPENDENT_AMBULATORY_CARE_PROVIDER_SITE_OTHER): Payer: Managed Care, Other (non HMO)

## 2016-12-06 VITALS — BP 110/60 | HR 84 | Resp 16 | Wt 178.0 lb

## 2016-12-06 DIAGNOSIS — D259 Leiomyoma of uterus, unspecified: Secondary | ICD-10-CM

## 2016-12-06 DIAGNOSIS — N921 Excessive and frequent menstruation with irregular cycle: Secondary | ICD-10-CM | POA: Diagnosis not present

## 2016-12-06 DIAGNOSIS — N920 Excessive and frequent menstruation with regular cycle: Secondary | ICD-10-CM | POA: Diagnosis not present

## 2016-12-06 MED ORDER — NORETHIN-ETH ESTRAD-FE BIPHAS 1 MG-10 MCG / 10 MCG PO TABS: 1.0000 | ORAL_TABLET | Freq: Every day | ORAL | 2 refills | Status: DC

## 2016-12-06 NOTE — Progress Notes (Signed)
Encounter reviewed by Dr. Brook Amundson C. Silva.  

## 2017-01-09 ENCOUNTER — Telehealth: Payer: Self-pay | Admitting: Obstetrics and Gynecology

## 2017-01-09 MED ORDER — NORETHINDRONE ACET-ETHINYL EST 1-20 MG-MCG PO TABS: 1.0000 | ORAL_TABLET | Freq: Every day | ORAL | 2 refills | Status: DC

## 2017-01-09 NOTE — Telephone Encounter (Signed)
Call to patient. Patient states Caroline Hayes will cost $130 for one month supply, is aware no generic, would like alternative. Has Mirena IUD in place, OCP for tx of fibroid.  Advised patient would review with Dr. Quincy Simmonds and return call. Patient is agreeable.    Dr. Quincy Simmonds -can you advise on alternative OCP for  Caroline Hayes?

## 2017-01-09 NOTE — Telephone Encounter (Signed)
I recommend Loestrin 1/20 instead.  She can try this for 3 months and then see me back for her recheck.

## 2017-01-09 NOTE — Telephone Encounter (Signed)
Left detailed message, ok per current dpr. Advised new Rx for Loestrin 1/20 #1/2RF to [facility] on file. Try for 3 months, f/u for recheck with Dr. Quincy Simmonds in 3 months. Return call to office at [phone] to schedule OV or for any additional questions.   Routing to provider for final review. Will close encounter.

## 2017-01-09 NOTE — Telephone Encounter (Signed)
Patient requesting a different birth control pill.  States the one she just changed to is over $130.

## 2017-01-17 ENCOUNTER — Other Ambulatory Visit: Payer: Self-pay | Admitting: Certified Nurse Midwife

## 2017-01-17 NOTE — Telephone Encounter (Signed)
Ultrasound appointment completed on 12/06/16. Ok to close

## 2017-02-05 ENCOUNTER — Telehealth: Payer: Self-pay | Admitting: Obstetrics and Gynecology

## 2017-02-05 NOTE — Telephone Encounter (Signed)
Left message to call Mileigh Tilley at [phone]. 

## 2017-02-05 NOTE — Telephone Encounter (Signed)
Patient called requesting to schedule a surgery consultation with Dr. Quincy Simmonds. She said, "I'm ready to get rid of these fibroids."

## 2017-02-13 NOTE — Telephone Encounter (Signed)
Patient returning your call states she can be reached after 1:30 today.

## 2017-02-15 NOTE — Telephone Encounter (Signed)
Patient would like to schedule a consult with Dr.Silva regarding removing her fibroids.

## 2017-02-18 NOTE — Telephone Encounter (Signed)
Spoke with patient and scheduled with Dr. Quincy Simmonds on 02-19-17 @ 1pm -eh

## 2017-02-19 ENCOUNTER — Ambulatory Visit (INDEPENDENT_AMBULATORY_CARE_PROVIDER_SITE_OTHER): Payer: Managed Care, Other (non HMO) | Admitting: Obstetrics and Gynecology

## 2017-02-19 ENCOUNTER — Encounter: Payer: Self-pay | Admitting: Obstetrics and Gynecology

## 2017-02-19 VITALS — BP 116/68 | HR 76 | Resp 16 | Wt 175.0 lb

## 2017-02-19 DIAGNOSIS — D259 Leiomyoma of uterus, unspecified: Secondary | ICD-10-CM

## 2017-02-19 DIAGNOSIS — N939 Abnormal uterine and vaginal bleeding, unspecified: Secondary | ICD-10-CM | POA: Diagnosis not present

## 2017-02-19 MED ORDER — MEDROXYPROGESTERONE ACETATE 150 MG/ML IM SUSP
150.0000 mg | Freq: Once | INTRAMUSCULAR | Status: AC
  Administered 2017-02-19: 150 mg via INTRAMUSCULAR

## 2017-02-19 NOTE — Progress Notes (Signed)
Patient is here for first Depo Provera Injection Okay to administer per Dr. Quincy Simmonds Next Depo Due between: 11/13-11/23 Last AEX: 11/28/16 PG AEX Scheduled: 12/20/17 BS  Patient is aware when next depo is due  Pt tolerated Injection well in [place].  Routed to provider for review, encounter closed.

## 2017-02-19 NOTE — Progress Notes (Signed)
GYNECOLOGY  VISIT   HPI: 41 y.o.   Single  Caucasian  female   G0P0 with No LMP recorded. Patient is not currently having periods (Reason: IUD).   here to discuss fibroids.  Pelvic ultrasound done 12/06/16.  IUD in endometrial canal.  Uterine fibroid 48 mm intramural. Fibroid distorts the endometrial canal.  Ovaries normal with right CL cyst.   Started LoLoestrin to help control menstrual bleeding and cramping.  Took for one month and had cycle improvement.  Was expensive, so she switched to Loestrin 1/20 for the last 2 months.  Periods are better.  This menstrual period lasting one week.  Notes some increase in vaginal drainage.  Some bilateral breast sensitivity currently.  No new partner.  Does not think she every want pregnancy.  Really tired of the bleeding controlling her life.  Has migraines without aura. Having increased headaches.  Not major headaches per patient.   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   GYNECOLOGIC HISTORY: No LMP recorded. Patient is not currently having periods (Reason: IUD).   Mirena.  Contraception:  IUD  Menopausal hormone therapy:  n/a Last mammogram:  n/a Last pap smear:   11/28/16 Pap and HR HPV negative             11/24/14 Neg; Neg HR HPV; 03/20/13 Neg History of Abnormal Pap: yes,LGSIL (+) HR HPV; Several Colpo's 2003 for CIN II&2009? negative        OB History    Gravida Para Term Preterm AB Living   0             SAB TAB Ectopic Multiple Live Births                     Patient Active Problem List   Diagnosis Date Noted  . IUD (intrauterine device) in place 09/17/2013  . DUB (dysfunctional uterine bleeding) 09/02/2013  . MVP (mitral valve prolapse) 01/17/2012  . Dysplasia of cervix, 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    Past Medical History:  Diagnosis Date  . Anxiety   . Chlamydia 2000  . Depression   . Dyspareunia   . Fibroid   . Heart murmur   . 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 MVP (mitral  valve prolapse)   . Rubella immune 2010    Past Surgical History:  Procedure Laterality Date  . ROOT CANAL  07/2014  . WISDOM TOOTH EXTRACTION  1999    Current Outpatient Prescriptions  Medication Sig Dispense Refill  . Loratadine (CLARITIN PO) Take 1 tablet by mouth as needed.     . norethindrone-ethinyl estradiol (MICROGESTIN,JUNEL,LOESTRIN) 1-20 MG-MCG tablet Take 1 tablet by mouth daily. 1 Package 2  . Omeprazole (PRILOSEC PO) Take by mouth as needed.      Current Facility-Administered Medications  Medication Dose Route Frequency Provider Last Rate Last Dose  . levonorgestrel (MIRENA) 20 MCG/24HR IUD   Intrauterine Once Terrance Mass, MD         ALLERGIES: Barley green; Eggs or egg-derived products; Ginger; and Imitrex [sumatriptan base]  Family History  Problem Relation Age of Onset  . Hypertension Mother   . Anemia Mother   . Hypertension Maternal Grandmother   . Cancer Sister   . Heart attack Paternal Grandfather   . Breast cancer Maternal Aunt 29       x2 recurence age 52    Social History   Social History  .  Marital status: Single    Spouse name: N/A  . Number of children: N/A  . Years of education: N/A   Occupational History  . Not on file.   Social History Main Topics  . Smoking status: Never Smoker  . Smokeless tobacco: Never Used  . Alcohol use 3.0 - 4.2 oz/week    5 - 7 Standard drinks or equivalent per week     Comment: sometimes  . Drug use: No  . Sexual activity: Yes    Partners: Male    Birth control/ protection: IUD     Comment: Mirena- Inserted 09-17-13   Other Topics Concern  . Not on file   Social History Narrative  . No narrative on file    ROS:  Pertinent items are noted in HPI.  PHYSICAL EXAMINATION:    BP 116/68 (BP Location: Right Arm, Patient Position: Sitting, Cuff Size: Normal)   Pulse 76   Resp 16   Wt 175 lb (79.4 kg)   BMI 27.00 kg/m     General appearance: alert, cooperative and appears stated age  No  exam.   ASSESSMENT  The following is taken from my note on 12/06/16: Uterine fibroid - intramural - slightly increased in size since 2016.  Symptomatic. Mirena IUD patient. Short strings.  Dysmenorrhea. Intolerance to COCs. Nausea, headaches. Migraines without aura.  PLAN  Stop combined oral contraceptives. We discussed Depo Provera, uterine artery embolization, and laparoscopic hysterectomy options for treatment. OK for Depo Provera today.  Return for IUD removal and EMB and vit D recheck in 3 - 4 weeks. Will work toward laparoscopic hysterectomy but now may not be a good time for the patient to do this.  I did discuss risks of bleeding, injection, DVT, PE, reaction to anesthesia.    An After Visit Summary was printed and given to the patient.  __25____ minutes face to face time of which over 50% was spent in counseling.

## 2017-02-27 ENCOUNTER — Other Ambulatory Visit: Payer: Managed Care, Other (non HMO)

## 2017-02-28 ENCOUNTER — Telehealth: Payer: Self-pay | Admitting: Obstetrics and Gynecology

## 2017-02-28 NOTE — Telephone Encounter (Signed)
Call placed to patient to review benefits for a endometrial biopsy and IUD removal. Left voicemail message requesting a return call.

## 2017-03-18 ENCOUNTER — Ambulatory Visit: Payer: Managed Care, Other (non HMO) | Admitting: Obstetrics and Gynecology

## 2017-03-19 ENCOUNTER — Ambulatory Visit: Payer: Managed Care, Other (non HMO) | Admitting: Obstetrics and Gynecology

## 2017-03-27 ENCOUNTER — Ambulatory Visit: Payer: Managed Care, Other (non HMO)

## 2017-03-27 ENCOUNTER — Ambulatory Visit (INDEPENDENT_AMBULATORY_CARE_PROVIDER_SITE_OTHER): Payer: Managed Care, Other (non HMO) | Admitting: Obstetrics and Gynecology

## 2017-03-27 VITALS — BP 120/74 | HR 62 | Resp 16 | Ht 67.25 in | Wt 175.0 lb

## 2017-03-27 DIAGNOSIS — N946 Dysmenorrhea, unspecified: Secondary | ICD-10-CM

## 2017-03-27 DIAGNOSIS — L659 Nonscarring hair loss, unspecified: Secondary | ICD-10-CM | POA: Diagnosis not present

## 2017-03-27 DIAGNOSIS — E559 Vitamin D deficiency, unspecified: Secondary | ICD-10-CM | POA: Diagnosis not present

## 2017-03-27 DIAGNOSIS — D219 Benign neoplasm of connective and other soft tissue, unspecified: Secondary | ICD-10-CM | POA: Diagnosis not present

## 2017-03-27 DIAGNOSIS — N939 Abnormal uterine and vaginal bleeding, unspecified: Secondary | ICD-10-CM

## 2017-03-27 LAB — POCT URINE PREGNANCY: Preg Test, Ur: NEGATIVE

## 2017-03-27 NOTE — Patient Instructions (Signed)
Please call for heavy bleeding, increasing pain, or fever!  Let me know how I can help you moving forward to treat your pain and bleeding.

## 2017-03-27 NOTE — Progress Notes (Signed)
GYNECOLOGY  VISIT   HPI: 41 y.o.   Single  Caucasian  female   G0P0 with Patient's last menstrual period was 03/18/2017 (exact date).   here for Mirena IUD removal, EMB and labs.   Took 4 Aleve prior to her visit today.   She received a Depo Provera injection on 02/19/17 to try to help with dysmenorrhea, bleeding and fibroid, which is enlarging and encroaching on the endometrial cavity.  [place] on that date showed intramural fibroid 47 x 46 x 50 mm and EMS 3.58 mm.   Reports less pain and less bleeding since her Depo Provera. Bled on 9/3 - 9/6 and then 9/24.  Feels like her hair is falling out and feels more moody.   Declines future childbearing.   ROS - URI with drainage and headache.   Works as an Chief Financial Officer.   GYNECOLOGIC HISTORY: Patient's last menstrual period was 03/18/2017 (exact date). Contraception: Mirena IUD inserted 09-17-13.  Depo provera 02/19/17. Menopausal hormone therapy:  n/a Last mammogram: n/a Last pap smear: 11-28-16 Neg:Neg HR HPV, 11-24-14 Neg:Neg HR HPV        OB History    Gravida Para Term Preterm AB Living   0             SAB TAB Ectopic Multiple Live Births                     Patient Active Problem List   Diagnosis Date Noted  . IUD (intrauterine device) in place 09/17/2013  . DUB (dysfunctional uterine bleeding) 09/02/2013  . MVP (mitral valve prolapse) 01/17/2012  . Dysplasia of cervix, 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    Past Medical History:  Diagnosis Date  . Anxiety   . Chlamydia 2000  . Depression   . Dyspareunia   . Fibroid   . Heart murmur   . 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 MVP (mitral valve prolapse)   . Rubella immune 2010    Past Surgical History:  Procedure Laterality Date  . ROOT CANAL  07/2014  . WISDOM TOOTH EXTRACTION  1999    Current Outpatient Prescriptions  Medication Sig Dispense Refill  . fluticasone (FLONASE) 50 MCG/ACT nasal spray Place into both nostrils daily.    . Loratadine (CLARITIN  PO) Take 1 tablet by mouth as needed.     . Omeprazole (PRILOSEC PO) Take by mouth as needed.      Current Facility-Administered Medications  Medication Dose Route Frequency Provider Last Rate Last Dose  . levonorgestrel (MIRENA) 20 MCG/24HR IUD   Intrauterine Once Terrance Mass, MD         ALLERGIES: Barley green; Eggs or egg-derived products; Ginger; and Imitrex [sumatriptan base]  Family History  Problem Relation Age of Onset  . Hypertension Mother   . Anemia Mother   . Hypertension Maternal Grandmother   . Cancer Sister   . Heart attack Paternal Grandfather   . Breast cancer Maternal Aunt 40       x2 recurence age 10    Social History   Social History  . Marital status: Single    Spouse name: N/A  . Number of children: N/A  . Years of education: N/A   Occupational History  . Not on file.   Social History Main Topics  . Smoking status: Never Smoker  . Smokeless tobacco: Never Used  . Alcohol use 3.0 - 4.2 oz/week    5 - 7  Standard drinks or equivalent per week     Comment: sometimes  . Drug use: No  . Sexual activity: Yes    Partners: Male    Birth control/ protection: IUD     Comment: Mirena- Inserted 09-17-13   Other Topics Concern  . Not on file   Social History Narrative  . No narrative on file    ROS:  Pertinent items are noted in HPI.  PHYSICAL EXAMINATION:    BP 120/74 (BP Location: Right Arm, Patient Position: Sitting, Cuff Size: Normal)   Pulse 62   Resp 16   Ht 5' 7.25" (1.708 m)   Wt 175 lb (79.4 kg)   LMP 03/18/2017 (Exact Date)   BMI 27.21 kg/m     General appearance: alert, cooperative and appears stated age   Pelvic: External genitalia:  no lesions              Urethra:  normal appearing urethra with no masses, tenderness or lesions              Bartholins and Skenes: normal                 Vagina: normal appearing vagina with normal color and discharge, no lesions              Cervix: no lesions.  IUD string not seen.                  Bimanual Exam:  Uterus:  normal size, contour, position, consistency, mobility, non-tender              Adnexa: no mass, fullness, tenderness              Attempted IUD removal Consent for procedure.  Sterile prep with Hibiclens.  Paracervical block with 10 cc 15 lidocaine - lot [id number], exp 06/2018.  Tenaculum to anterior cervical lip.  Os finder and Hegar dilators used.  IUD removers and dressing forceps used to attempt IUD removal, which was unsuccessful. Procedure aborted.  No EMB performed.  Informal bedside TV ultrasound then performed. IUD in canal and resting against fibroid that is encroaching on the endometrium.   Chaperone was present for exam and ultrasound.  ASSESSMENT  Uterine fibroid encroaching on endometrial canal.  Mirena IUD with short strings.  Dysmenorrhea.  Status post Depo Provera x 1. Low vit D.  Hair loss.  Possible effect of Depo Provera.  PLAN  Check Vit D and TSH.  We discussed etiologies for her bleeding and pain including her fibroid, endometriosis, and perhaps the IUD itself (causing pain) due to some cavity distortion. Options for management include - removal of the IUD hysteroscopically with dilation and curettage, continued Depo Provera, uterine artery embolization combined with tubal ligation or salpingectomy, myomectomy, laparoscopic hysterectomy with bilateral salpingectomy. Risks and benefits of each discussed.  We did focus on laparoscopic hysterectomy today as the patient was not clear that this can be performed with this technique that that the recovery is less than with an abdominal hysterectomy.   An After Visit Summary was printed and given to the patient.  ___25__ minutes face to face time of which over 50% was spent in counseling.

## 2017-03-28 LAB — VITAMIN D 25 HYDROXY (VIT D DEFICIENCY, FRACTURES): VIT D 25 HYDROXY: 27.8 ng/mL — AB (ref 30.0–100.0)

## 2017-04-02 NOTE — Telephone Encounter (Signed)
IUD removal completed on 03/27/17.

## 2017-04-05 ENCOUNTER — Other Ambulatory Visit: Payer: Self-pay | Admitting: Certified Nurse Midwife

## 2017-04-10 ENCOUNTER — Other Ambulatory Visit: Payer: Self-pay | Admitting: Certified Nurse Midwife

## 2017-04-19 LAB — SPECIMEN STATUS REPORT

## 2017-04-19 LAB — TSH: TSH: 2.83 u[IU]/mL (ref 0.450–4.500)

## 2017-04-23 ENCOUNTER — Telehealth: Payer: Self-pay | Admitting: Obstetrics and Gynecology

## 2017-04-23 NOTE — Telephone Encounter (Signed)
Return call to patient. Patient is interested in proceeding with hysterectomy in early January 2019. Would like to avoid taking another Depo injection. Next Depo due 11-13 to 05-17-17.  Prefers to start birth control pills until hysterectomy. Took Lo Loestrin this summer and it was very expensive, but agreeable to this for short term basis. Also asking for medication to "increase clotting."  Recommened consult with Dr Quincy Simmonds to review above and make plan. Patient declines office visit and states she has discussed with Dr Quincy Simmonds and was told to call back when ready to schedule. Discussed potential date of 07-09-17. Advised will review with Dr Quincy Simmonds and call her back.

## 2017-04-23 NOTE — Telephone Encounter (Signed)
Patient wants to schedule surgery for a hysterectomy.

## 2017-04-24 NOTE — Telephone Encounter (Signed)
Ok for [employer] 1/20 x 3 months, until surgery.

## 2017-04-30 ENCOUNTER — Telehealth: Payer: Self-pay | Admitting: Obstetrics and Gynecology

## 2017-04-30 MED ORDER — NORETHIN ACE-ETH ESTRAD-FE 1-20 MG-MCG PO TABS: ORAL_TABLET | ORAL | 0 refills | Status: DC

## 2017-04-30 NOTE — Telephone Encounter (Signed)
Spoke with patient. Patient states she was given Lysteda in the ER in [place] for bleeding. Asking for a refill. Patient is planning to have surgery in January with Dr.Silva. Asking for a refill to get her to surgery. Advised of Dr.Silva's recommendation from phone note 04/23/2017 to start on Loestrin 1/20 x 3 months. Patient is agreeable. Asking if she will need to take pills continuously active or have a menses each month. Advised will review with Dr.Silva regarding both medications and return call.

## 2017-04-30 NOTE — Telephone Encounter (Signed)
Patient is calling to speak with nurse about getting a prescription for a blood thickener that she discussed with Dr Quincy Simmonds.

## 2017-04-30 NOTE — Telephone Encounter (Signed)
Ok to take the LoEstrin continuously and skip the placebo pills.  It is contraindicated to use concurrent Lysteda and oral contraception. This can increase the risk of complication of thrombosis, I.e. It can make the blood too thick.

## 2017-04-30 NOTE — Telephone Encounter (Signed)
Spoke with patient. Advised of message as seen below from [place]. Patient verbalizes understanding. Rx for Loestrin 1/20 Fe take continuous active pills #4 0RF sent to pharmacy on file. Patient verbalizes understanding.  Routing to provider for final review. Patient agreeable to disposition. Will close encounter.

## 2017-05-03 NOTE — Telephone Encounter (Signed)
Call to patient regarding surgery date options. Advised that first week of January is now available.  Patient states she has already made arrangements for 07-09-17 and thinks she will be unable to change them. Patient has already discussed Loestrin 1/20 with triage nurse (see alternate phone encounter). Patient will consider dates and call back if decides to change but for now wants to proceed on 07-09-17.  Encounter closed.

## 2017-05-07 ENCOUNTER — Telehealth: Payer: Self-pay | Admitting: Obstetrics and Gynecology

## 2017-05-07 NOTE — Telephone Encounter (Signed)
Spoke with patient regarding for surgery for January, 2019. Patient advises her employer is in open enrollment at this time, but they will be renewing with [employer]. Patient states the benefits will remain the same for 2019. Patient has confirmed and is ready to proceed with scheduling. Patient desires to schedule on 07/09/17. Patient understands benefits will be re-verified and pre-certified after June 25, 2017. Forwarding to Conservation officer, historic buildings for scheduling.  Routing to Lamont Snowball, RN

## 2017-05-08 NOTE — Telephone Encounter (Signed)
Case request for 07-09-17 sent to Central Scheduling.

## 2017-05-13 NOTE — Telephone Encounter (Signed)
Called patient to notify of date/time of surgery, LMOVM to call me back.

## 2017-05-13 NOTE — Telephone Encounter (Signed)
Spoke with patient and notified surgery is scheduled 07-09-17 7:30am at [facility] with patient medications to avoid the week prior to surgery. Advised the hospital will call her to make her pre-op appointment. I made her pre-op appointment with Dr.Silva for 06-13-17, her 1 week post op appointment 07-15-17 3:30 and her 6 week post op appointment 2:30 with Dr.Silva. Patient knows she will need a bowel prep unless told otherwise by Dr.Silva on pre-op visit.

## 2017-05-15 ENCOUNTER — Encounter: Payer: Self-pay | Admitting: Obstetrics and Gynecology

## 2017-05-15 ENCOUNTER — Telehealth: Payer: Self-pay | Admitting: *Deleted

## 2017-05-15 DIAGNOSIS — Z0289 Encounter for other administrative examinations: Secondary | ICD-10-CM

## 2017-05-15 NOTE — Telephone Encounter (Signed)
Left detailed message, ok per current dpr. Advised patient MyChart message received with attachments. Will forward to [facility] in our insurance/benefits department. Will have Deloris Ping return call to review Short-term disability/FMLA process and fees associated. Return call to office at [phone] with any additional questions.    Deloris Ping -forms placed on your desk, please f/u with patient. Scheduled for surgery on 07/09/17 with Dr. Quincy Simmonds.   Cc: Dr. Quincy Simmonds     From [place], [place] To Nunzio Cobbs, [place] Sent 05/15/2017 8:45 AM  I have some forms to fill out for short-term disability. Do I need to mail them to you? They are attached.  Thanks!  [employer]  [phone]

## 2017-05-15 NOTE — Telephone Encounter (Signed)
See telephone encounter dated 05/15/17.

## 2017-05-15 NOTE — Telephone Encounter (Signed)
Patient dropped off and paid for FMLA forms to be completed.  Routing to [place].

## 2017-05-22 NOTE — Telephone Encounter (Signed)
Thank you for the update!

## 2017-05-22 NOTE — Telephone Encounter (Signed)
Forwarding to [name], will close encounter.

## 2017-06-04 ENCOUNTER — Telehealth: Payer: Self-pay

## 2017-06-04 NOTE — Telephone Encounter (Signed)
Tried calling patient to reschedule appointment with Dr. Quincy Simmonds on 06/03/17. No answer, Message left for patient to return my call.

## 2017-06-13 ENCOUNTER — Ambulatory Visit: Payer: Managed Care, Other (non HMO) | Admitting: Obstetrics and Gynecology

## 2017-06-24 ENCOUNTER — Telehealth: Payer: Self-pay

## 2017-06-24 NOTE — Telephone Encounter (Signed)
Spoke with patient and scheduled pre-op appointment with Dr.Silva on 07-08-17 at 12:00. Advised patient to arrive at 11:45am to register. She thanked me for calling.

## 2017-07-01 ENCOUNTER — Other Ambulatory Visit: Payer: Self-pay | Admitting: Obstetrics & Gynecology

## 2017-07-02 ENCOUNTER — Encounter (HOSPITAL_COMMUNITY): Payer: Self-pay

## 2017-07-02 ENCOUNTER — Other Ambulatory Visit: Payer: Self-pay

## 2017-07-02 ENCOUNTER — Encounter (HOSPITAL_COMMUNITY)
Admission: RE | Admit: 2017-07-02 | Discharge: 2017-07-02 | Disposition: A | Discharge status: Home / Self Care | Payer: Managed Care, Other (non HMO) | Source: Ambulatory Visit | Attending: Obstetrics and Gynecology | Admitting: Obstetrics and Gynecology

## 2017-07-02 DIAGNOSIS — N870 Mild cervical dysplasia: Secondary | ICD-10-CM | POA: Insufficient documentation

## 2017-07-02 DIAGNOSIS — Z975 Presence of (intrauterine) contraceptive device: Secondary | ICD-10-CM | POA: Diagnosis not present

## 2017-07-02 DIAGNOSIS — Z01812 Encounter for preprocedural laboratory examination: Secondary | ICD-10-CM | POA: Insufficient documentation

## 2017-07-02 DIAGNOSIS — I341 Nonrheumatic mitral (valve) prolapse: Secondary | ICD-10-CM | POA: Insufficient documentation

## 2017-07-02 HISTORY — DX: Gastro-esophageal reflux disease without esophagitis: K21.9

## 2017-07-02 HISTORY — DX: Headache, unspecified: R51.9

## 2017-07-02 HISTORY — DX: Family history of other specified conditions: Z84.89

## 2017-07-02 HISTORY — DX: Headache: R51

## 2017-07-02 LAB — CBC
HCT: 42.5 % (ref 36.0–46.0)
Hemoglobin: 14.6 g/dL (ref 12.0–15.0)
MCH: 30.8 pg (ref 26.0–34.0)
MCHC: 34.4 g/dL (ref 30.0–36.0)
MCV: 89.7 fL (ref 78.0–100.0)
Platelets: 193 10*3/uL (ref 150–400)
RBC: 4.74 MIL/uL (ref 3.87–5.11)
RDW: 12.4 % (ref 11.5–15.5)
WBC: 6 10*3/uL (ref 4.0–10.5)

## 2017-07-02 NOTE — Patient Instructions (Signed)
Your procedure is scheduled on: Tuesday July 09, 2016 at 7:30 am  Enter through the [employer] [facility] at: 6:00 am  Pick up the phone at the desk and dial [phone].  Call this number if you have problems the morning of surgery: [phone].  Remember: Do NOT eat food or drink any liquids: after Midnight on Monday January 14  Take these medicines the morning of surgery with a SIP OF WATER: Claritin, Prilosec, (Flonase spray if needed)  Do NOT wear jewelry (body piercing), metal hair clips/bobby pins, make-up, or nail polish. Do NOT wear lotions, powders, or perfumes.  You may wear deoderant. Do NOT shave for 48 hours prior to surgery. Do NOT bring valuables to the hospital. Contacts, dentures, or bridgework may not be worn into surgery. Leave suitcase in car.  After surgery it may be brought to your room.  For patients admitted to the hospital, checkout time is 11:00 AM the day of discharge. Have a responsible adult drive you home and stay with you for 24 hours after your procedure

## 2017-07-04 ENCOUNTER — Telehealth: Payer: Self-pay | Admitting: *Deleted

## 2017-07-04 NOTE — Telephone Encounter (Signed)
Call from patient. Advised due to family emergency with Dr Quincy Simmonds, she will be unable to perform surgery as scheduled next week. Offered option of surgery with Dr Sabra Heck as scheduled.  Patient agreeable to consult with Dr Sabra Heck to discuss. Consult 07-04-17 at 1130 with Dr Sabra Heck.  Routing to provider for final review. Patient agreeable to disposition. Will close encounter.

## 2017-07-04 NOTE — Telephone Encounter (Signed)
Call to patient. Left message to call back. Calling to review surgery details.

## 2017-07-05 ENCOUNTER — Other Ambulatory Visit: Payer: Self-pay

## 2017-07-05 ENCOUNTER — Ambulatory Visit (INDEPENDENT_AMBULATORY_CARE_PROVIDER_SITE_OTHER): Payer: Managed Care, Other (non HMO) | Admitting: Obstetrics & Gynecology

## 2017-07-05 ENCOUNTER — Ambulatory Visit: Payer: Managed Care, Other (non HMO) | Admitting: Obstetrics and Gynecology

## 2017-07-05 ENCOUNTER — Encounter: Payer: Self-pay | Admitting: Obstetrics & Gynecology

## 2017-07-05 VITALS — BP 122/80 | HR 50 | Ht 67.25 in | Wt 183.6 lb

## 2017-07-05 DIAGNOSIS — D219 Benign neoplasm of connective and other soft tissue, unspecified: Secondary | ICD-10-CM

## 2017-07-05 DIAGNOSIS — N946 Dysmenorrhea, unspecified: Secondary | ICD-10-CM | POA: Diagnosis not present

## 2017-07-05 DIAGNOSIS — N898 Other specified noninflammatory disorders of vagina: Secondary | ICD-10-CM

## 2017-07-05 DIAGNOSIS — N921 Excessive and frequent menstruation with irregular cycle: Secondary | ICD-10-CM

## 2017-07-05 NOTE — Progress Notes (Signed)
42 y.o. G0P0 SingleCaucasian female here for discussion of surgical options.  TLH/bilateral salpingectomy/cystoscopy planned with Dr. Quincy Simmonds.  Dr. Quincy Simmonds is out on personal leave and pt here to discuss options for treatment.  Has been on OCPs (Loloestrin and Loestrin) as well as Depo Provera.  Has Mirena IUD which has been unable ot remove due to no visible strings and distorted endometrial cavity from fibroid, most recently measuring ~5cm.  Pt does have menorrhagia.  Endometrial biopsy has been attempted but this was unsuccessful due to endometrial cavity distortion from fibroid.    Ultrasound performed 12/06/16 showed uterus measuring 9.2 x 6.8 x 5.3cm with 3.53mm endometrium.  Cavity was distorted and off to right due to centrally located fibroid.  Ovaries were normal with right corpus luteum.  Has experienced cyclic pelvic pain as well.  Has been Dr. Collene Mares but colonoscopy was not recommended.  Pt feels this pain is related to bowel movements but wants to discuss possibility of endometriosis and what can be done for treatment.    She really is desirous of definitive treatment.  TLH/bilateral salpingectomy with possible treatment of endometriosis discussed.  Procedure reviewed.  Hospital stay, recovery and pain management all discussed.  Risks discussed including but not limited to bleeding, 1% risk of receiving a  transfusion, infection, 3-4% risk of bowel/bladder/ureteral/vascular injury discussed as well as possible need for additional surgery if injury does occur discussed.  DVT/PE and rare risk of death discussed.  My actual complications with prior surgeries discussed.  Vaginal cuff dehiscence discussed.  Hernia formation discussed.  Positioning and incision locations discussed.  Patient aware if pathology abnormal she may need additional treatment.  Pt aware this will remove future child bearing possibilities (but could use gestational carrier if desired).  All questions answered.    Ob Hx:   No LMP  recorded. Patient is not currently having periods (Reason: IUD).          Sexually active: Yes.   Birth control: IUD and Junel 1/20. Last pap: 11-28-16 Neg:Neg HR HPV, 11-24-14 Neg:Neg HR HPV Endometrial biopsy 3/15 with Dr. Toney Rakes was negative Last MMG: N/A Tobacco: no  Past Surgical History:  Procedure Laterality Date  . ROOT CANAL  07/2014  . WISDOM TOOTH EXTRACTION  1999    Past Medical History:  Diagnosis Date  . Anxiety   . Chlamydia 2000  . Depression   . Dyspareunia   . Family history of adverse reaction to anesthesia    mother with nausea and vomiting  . Fibroid   . GERD (gastroesophageal reflux disease)    occasional, mild  . Headache    due to bnirth control pills  . Heart murmur   . 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 MVP (mitral valve prolapse)   . Rubella immune 2010    Allergies: Barley green; Eggs or egg-derived products; Ginger; and Imitrex [sumatriptan base]  Current Outpatient Medications  Medication Sig Dispense Refill  . fluticasone (FLONASE) 50 MCG/ACT nasal spray Place 1 spray into both nostrils daily as needed (for allergies.).     Marland Kitchen loratadine (CLARITIN) 10 MG tablet Take 10 mg by mouth daily as needed for allergies.    . naproxen sodium (ALEVE) 220 MG tablet Take 220-440 mg by mouth 2 (two) times daily as needed (for pain.).    Marland Kitchen norethindrone-ethinyl estradiol (LOESTRIN FE 1/20) 1-20 MG-MCG tablet Take continuous active pills. Will need 4 packs for 3 month supply. (Patient taking differently: Take  1 tablet by mouth daily. Take continuous active pills. Will need 4 packs for 3 month supply.) 4 Package 0  . omeprazole (PRILOSEC OTC) 20 MG tablet Take 20 mg by mouth daily as needed (for acid reflux/indigestion.).     Current Facility-Administered Medications  Medication Dose Route Frequency Provider Last Rate Last Dose  . levonorgestrel (MIRENA) 20 MCG/24HR IUD   Intrauterine Once Terrance Mass, MD        ROS: A  comprehensive review of systems was negative.  Exam:    BP 122/80 (BP Location: Right Arm, Patient Position: Sitting, Cuff Size: Normal)   Pulse (!) 50   Ht 5' 7.25" (1.708 m)   Wt 183 lb 9.6 oz (83.3 kg)   BMI 28.54 kg/m   General appearance: alert and cooperative Head: Normocephalic, without obvious abnormality, atraumatic Neck: no adenopathy, supple, symmetrical, trachea midline and thyroid not enlarged, symmetric, no tenderness/mass/nodules Lungs: clear to auscultation bilaterally Heart: regular rate and rhythm, S1, S2 normal, no murmur, click, rub or gallop Abdomen: soft, non-tender; bowel sounds normal; no masses,  no organomegaly Extremities: extremities normal, atraumatic, no cyanosis or edema Skin: Skin color, texture, turgor normal. No rashes or lesions Lymph nodes: Cervical, supraclavicular, and axillary nodes normal. no inguinal nodes palpated Neurologic: Grossly normal  Pelvic: External genitalia:  no lesions              Urethra: normal appearing urethra with no masses, tenderness or lesions              Bartholins and Skenes: normal                 Vagina: normal appearing vagina with normal color and discharge, no lesions              Cervix: normal appearance              Pap taken: No.        Bimanual Exam:  Uterus:  uterus is normal size, shape, consistency and nontender, enlarged to 10-12 week's size                                      Adnexa:    normal adnexa in size, nontender and no masses                                      Rectovaginal: Deferred                                      Anus:  defer exam  A: Menorrhagia Enlarged uterus with 5cm uterine fibroid Mirena IUD with non-visualized strings Vaginal discharge noted today  P:  TLH/bilateral salpingectomy/cystoscopy with possible treatment of endometriosis planned Affirm testing pending Hysterectomy brochure given with pre and post op instructions.  ~60 minutes spent with pt today in my first visit  with her.  She does desire to proceed with surgery and will keep surgery date that has previously been scheduled.  Long term partner/significant other accompanied her today.

## 2017-07-08 ENCOUNTER — Ambulatory Visit: Payer: Managed Care, Other (non HMO) | Admitting: Obstetrics and Gynecology

## 2017-07-08 MED ORDER — BUPIVACAINE HCL (PF) 0.25 % IJ SOLN
INTRAMUSCULAR | Status: AC
  Filled 2017-07-08: qty 30

## 2017-07-08 MED ORDER — ROPIVACAINE HCL 5 MG/ML IJ SOLN
INTRAMUSCULAR | Status: AC
  Filled 2017-07-08: qty 30

## 2017-07-08 MED ORDER — SODIUM CHLORIDE 0.9 % IJ SOLN
INTRAMUSCULAR | Status: AC
  Filled 2017-07-08: qty 50

## 2017-07-08 NOTE — Anesthesia Preprocedure Evaluation (Addendum)
Anesthesia Evaluation  Patient identified by MRN, date of birth, ID band Patient awake    Reviewed: Allergy & Precautions, H&P , NPO status , Patient's Chart, lab work & pertinent test results, reviewed documented beta blocker date and time   Airway Mallampati: I  TM Distance: >3 FB Neck ROM: full    Dental no notable dental hx.    Pulmonary neg pulmonary ROS,    Pulmonary exam normal breath sounds clear to auscultation       Cardiovascular Exercise Tolerance: Good negative cardio ROS   Rhythm:regular Rate:Normal     Neuro/Psych negative neurological ROS  negative psych ROS   GI/Hepatic negative GI ROS, Neg liver ROS, GERD  ,  Endo/Other  negative endocrine ROS  Renal/GU negative Renal ROS  negative genitourinary   Musculoskeletal negative musculoskeletal ROS (+)   Abdominal   Peds  Hematology negative hematology ROS (+)   Anesthesia Other Findings Day of surgery medications reviewed with the patient.  Reproductive/Obstetrics negative OB ROS                            Anesthesia Physical Anesthesia Plan  ASA: II  Anesthesia Plan: General   Post-op Pain Management:    Induction: Intravenous  PONV Risk Score and Plan: 4 or greater and Ondansetron, Dexamethasone and Scopolamine patch - Pre-op  Airway Management Planned: Oral ETT  Additional Equipment:   Intra-op Plan:   Post-operative Plan: Extubation in OR  Informed Consent: I have reviewed the patients History and Physical, chart, labs and discussed the procedure including the risks, benefits and alternatives for the proposed anesthesia with the patient or authorized representative who has indicated his/her understanding and acceptance.   Dental Advisory Given  Plan Discussed with: CRNA and Anesthesiologist  Anesthesia Plan Comments:        Anesthesia Quick Evaluation

## 2017-07-09 ENCOUNTER — Ambulatory Visit (HOSPITAL_COMMUNITY)
Admission: AD | Admit: 2017-07-09 | Discharge: 2017-07-09 | Disposition: A | Discharge status: Home / Self Care | Payer: Managed Care, Other (non HMO) | Source: Ambulatory Visit | Attending: Obstetrics & Gynecology | Admitting: Obstetrics & Gynecology

## 2017-07-09 ENCOUNTER — Ambulatory Visit (HOSPITAL_COMMUNITY): Payer: Managed Care, Other (non HMO) | Admitting: Anesthesiology

## 2017-07-09 ENCOUNTER — Encounter (HOSPITAL_COMMUNITY)
Admission: AD | Disposition: A | Discharge status: Home / Self Care | Payer: Self-pay | Source: Ambulatory Visit | Attending: Obstetrics & Gynecology

## 2017-07-09 ENCOUNTER — Encounter (HOSPITAL_COMMUNITY): Payer: Self-pay

## 2017-07-09 ENCOUNTER — Other Ambulatory Visit: Payer: Self-pay

## 2017-07-09 DIAGNOSIS — N946 Dysmenorrhea, unspecified: Secondary | ICD-10-CM | POA: Insufficient documentation

## 2017-07-09 DIAGNOSIS — Z91012 Allergy to eggs: Secondary | ICD-10-CM | POA: Diagnosis not present

## 2017-07-09 DIAGNOSIS — N852 Hypertrophy of uterus: Secondary | ICD-10-CM

## 2017-07-09 DIAGNOSIS — K219 Gastro-esophageal reflux disease without esophagitis: Secondary | ICD-10-CM | POA: Insufficient documentation

## 2017-07-09 DIAGNOSIS — N920 Excessive and frequent menstruation with regular cycle: Secondary | ICD-10-CM | POA: Diagnosis not present

## 2017-07-09 DIAGNOSIS — I341 Nonrheumatic mitral (valve) prolapse: Secondary | ICD-10-CM | POA: Diagnosis not present

## 2017-07-09 DIAGNOSIS — Z91018 Allergy to other foods: Secondary | ICD-10-CM | POA: Insufficient documentation

## 2017-07-09 DIAGNOSIS — Z888 Allergy status to other drugs, medicaments and biological substances status: Secondary | ICD-10-CM | POA: Insufficient documentation

## 2017-07-09 DIAGNOSIS — Z30432 Encounter for removal of intrauterine contraceptive device: Secondary | ICD-10-CM | POA: Insufficient documentation

## 2017-07-09 DIAGNOSIS — F419 Anxiety disorder, unspecified: Secondary | ICD-10-CM | POA: Diagnosis not present

## 2017-07-09 DIAGNOSIS — F329 Major depressive disorder, single episode, unspecified: Secondary | ICD-10-CM | POA: Insufficient documentation

## 2017-07-09 DIAGNOSIS — N939 Abnormal uterine and vaginal bleeding, unspecified: Secondary | ICD-10-CM | POA: Diagnosis not present

## 2017-07-09 DIAGNOSIS — D259 Leiomyoma of uterus, unspecified: Secondary | ICD-10-CM

## 2017-07-09 DIAGNOSIS — Z79899 Other long term (current) drug therapy: Secondary | ICD-10-CM | POA: Diagnosis not present

## 2017-07-09 HISTORY — PX: CYSTOSCOPY: SHX5120

## 2017-07-09 HISTORY — PX: TOTAL LAPAROSCOPIC HYSTERECTOMY WITH SALPINGECTOMY: SHX6742

## 2017-07-09 HISTORY — PX: IUD REMOVAL: SHX5392

## 2017-07-09 LAB — NUSWAB VAGINITIS (VG)
CANDIDA GLABRATA, NAA: NEGATIVE
Candida albicans, NAA: NEGATIVE
TRICH VAG BY NAA: NEGATIVE

## 2017-07-09 LAB — HEMOGLOBIN: Hemoglobin: 13.4 g/dL (ref 12.0–15.0)

## 2017-07-09 LAB — PREGNANCY, URINE: Preg Test, Ur: NEGATIVE

## 2017-07-09 SURGERY — HYSTERECTOMY, TOTAL, LAPAROSCOPIC, WITH SALPINGECTOMY
Anesthesia: General | Site: Vagina

## 2017-07-09 MED ORDER — OXYCODONE-ACETAMINOPHEN 5-325 MG PO TABS: 1.0000 | ORAL_TABLET | ORAL | 0 refills | Status: DC | PRN

## 2017-07-09 MED ORDER — GLYCOPYRROLATE 0.2 MG/ML IJ SOLN
INTRAMUSCULAR | Status: AC
  Filled 2017-07-09: qty 1

## 2017-07-09 MED ORDER — MIDAZOLAM HCL 2 MG/2ML IJ SOLN
INTRAMUSCULAR | Status: DC | PRN
  Administered 2017-07-09: 2 mg via INTRAVENOUS

## 2017-07-09 MED ORDER — ALUM & MAG HYDROXIDE-SIMETH 200-200-20 MG/5ML PO SUSP: 30.0000 mL | ORAL | Status: DC | PRN

## 2017-07-09 MED ORDER — ENOXAPARIN SODIUM 40 MG/0.4ML ~~LOC~~ SOLN
40.0000 mg | SUBCUTANEOUS | Status: AC
  Administered 2017-07-09: 40 mg via SUBCUTANEOUS
  Filled 2017-07-09: qty 0.4

## 2017-07-09 MED ORDER — SCOPOLAMINE 1 MG/3DAYS TD PT72
MEDICATED_PATCH | TRANSDERMAL | Status: AC
  Administered 2017-07-09: 1.5 mg via TRANSDERMAL
  Filled 2017-07-09: qty 1

## 2017-07-09 MED ORDER — FENTANYL CITRATE (PF) 100 MCG/2ML IJ SOLN
INTRAMUSCULAR | Status: DC | PRN
  Administered 2017-07-09: 100 ug via INTRAVENOUS
  Administered 2017-07-09 (×3): 50 ug via INTRAVENOUS

## 2017-07-09 MED ORDER — CEFOTETAN DISODIUM-DEXTROSE 2-2.08 GM-%(50ML) IV SOLR
2.0000 g | INTRAVENOUS | Status: AC
  Administered 2017-07-09: 2 g via INTRAVENOUS

## 2017-07-09 MED ORDER — LIDOCAINE HCL (CARDIAC) 20 MG/ML IV SOLN
INTRAVENOUS | Status: AC
  Filled 2017-07-09: qty 5

## 2017-07-09 MED ORDER — KETOROLAC TROMETHAMINE 30 MG/ML IJ SOLN
30.0000 mg | Freq: Four times a day (QID) | INTRAMUSCULAR | Status: DC
  Administered 2017-07-09: 30 mg via INTRAVENOUS
  Filled 2017-07-09: qty 1

## 2017-07-09 MED ORDER — BUPIVACAINE HCL (PF) 0.25 % IJ SOLN
INTRAMUSCULAR | Status: DC | PRN
  Administered 2017-07-09: 13 mL

## 2017-07-09 MED ORDER — PANTOPRAZOLE SODIUM 40 MG IV SOLR
40.0000 mg | Freq: Every day | INTRAVENOUS | Status: DC
  Filled 2017-07-09: qty 40

## 2017-07-09 MED ORDER — SUGAMMADEX SODIUM 200 MG/2ML IV SOLN
INTRAVENOUS | Status: AC
Start: 2017-07-09 — End: 2017-07-09
  Filled 2017-07-09: qty 2

## 2017-07-09 MED ORDER — CEFOTETAN DISODIUM-DEXTROSE 2-2.08 GM-%(50ML) IV SOLR
INTRAVENOUS | Status: AC
  Filled 2017-07-09: qty 50

## 2017-07-09 MED ORDER — MORPHINE SULFATE (PF) 4 MG/ML IV SOLN: 1.0000 mg | INTRAVENOUS | Status: DC | PRN

## 2017-07-09 MED ORDER — KETOROLAC TROMETHAMINE 30 MG/ML IJ SOLN: 30.0000 mg | Freq: Four times a day (QID) | INTRAMUSCULAR | Status: DC

## 2017-07-09 MED ORDER — KETOROLAC TROMETHAMINE 30 MG/ML IJ SOLN
INTRAMUSCULAR | Status: DC | PRN
  Administered 2017-07-09: 30 mg via INTRAVENOUS

## 2017-07-09 MED ORDER — ONDANSETRON HCL 4 MG/2ML IJ SOLN
INTRAMUSCULAR | Status: DC | PRN
  Administered 2017-07-09: 4 mg via INTRAVENOUS

## 2017-07-09 MED ORDER — LIDOCAINE HCL 1 % IJ SOLN
INTRAMUSCULAR | Status: AC
  Filled 2017-07-09: qty 20

## 2017-07-09 MED ORDER — IBUPROFEN 800 MG PO TABS: 800.0000 mg | ORAL_TABLET | Freq: Three times a day (TID) | ORAL | 0 refills | Status: DC | PRN

## 2017-07-09 MED ORDER — MENTHOL 3 MG MT LOZG: 1.0000 | LOZENGE | OROMUCOSAL | Status: DC | PRN

## 2017-07-09 MED ORDER — DEXAMETHASONE SODIUM PHOSPHATE 4 MG/ML IJ SOLN
INTRAMUSCULAR | Status: AC
  Filled 2017-07-09: qty 1

## 2017-07-09 MED ORDER — ROCURONIUM BROMIDE 100 MG/10ML IV SOLN
INTRAVENOUS | Status: DC | PRN
  Administered 2017-07-09: 40 mg via INTRAVENOUS
  Administered 2017-07-09: 10 mg via INTRAVENOUS

## 2017-07-09 MED ORDER — ONDANSETRON HCL 4 MG/2ML IJ SOLN
INTRAMUSCULAR | Status: AC
  Filled 2017-07-09: qty 2

## 2017-07-09 MED ORDER — FENTANYL CITRATE (PF) 100 MCG/2ML IJ SOLN
INTRAMUSCULAR | Status: AC
  Filled 2017-07-09: qty 2

## 2017-07-09 MED ORDER — ROPIVACAINE HCL 5 MG/ML IJ SOLN
INTRAMUSCULAR | Status: DC | PRN
  Administered 2017-07-09: 60 mL

## 2017-07-09 MED ORDER — SUGAMMADEX SODIUM 200 MG/2ML IV SOLN
INTRAVENOUS | Status: DC | PRN
  Administered 2017-07-09: 200 mg via INTRAVENOUS

## 2017-07-09 MED ORDER — PROPOFOL 10 MG/ML IV BOLUS
INTRAVENOUS | Status: DC | PRN
  Administered 2017-07-09: 180 mg via INTRAVENOUS

## 2017-07-09 MED ORDER — KETOROLAC TROMETHAMINE 30 MG/ML IJ SOLN
INTRAMUSCULAR | Status: AC
  Filled 2017-07-09: qty 1

## 2017-07-09 MED ORDER — HYDROMORPHONE HCL 1 MG/ML IJ SOLN
INTRAMUSCULAR | Status: AC
  Filled 2017-07-09: qty 0.5

## 2017-07-09 MED ORDER — GLYCOPYRROLATE 0.2 MG/ML IJ SOLN
INTRAMUSCULAR | Status: DC | PRN
  Administered 2017-07-09: 0.2 mg via INTRAVENOUS

## 2017-07-09 MED ORDER — SODIUM CHLORIDE 0.9 % IR SOLN
Status: DC | PRN
  Administered 2017-07-09: 3000 mL

## 2017-07-09 MED ORDER — ENOXAPARIN SODIUM 40 MG/0.4ML ~~LOC~~ SOLN
40.0000 mg | SUBCUTANEOUS | Status: DC
  Filled 2017-07-09: qty 0.4

## 2017-07-09 MED ORDER — SIMETHICONE 80 MG PO CHEW
80.0000 mg | CHEWABLE_TABLET | Freq: Four times a day (QID) | ORAL | Status: DC | PRN
Start: 2017-07-09 — End: 2017-07-09

## 2017-07-09 MED ORDER — HYDROMORPHONE HCL 1 MG/ML IJ SOLN
INTRAMUSCULAR | Status: AC
  Administered 2017-07-09: 0.5 mg via INTRAVENOUS
  Filled 2017-07-09: qty 0.5

## 2017-07-09 MED ORDER — DEXTROSE-NACL 5-0.45 % IV SOLN
INTRAVENOUS | Status: DC
  Administered 2017-07-09: 12:00:00 via INTRAVENOUS

## 2017-07-09 MED ORDER — LIDOCAINE HCL (CARDIAC) 20 MG/ML IV SOLN
INTRAVENOUS | Status: DC | PRN
  Administered 2017-07-09: 25 mg via INTRAVENOUS
  Administered 2017-07-09: 80 mg via INTRAVENOUS
  Administered 2017-07-09: 25 mg via INTRAVENOUS

## 2017-07-09 MED ORDER — FENTANYL CITRATE (PF) 250 MCG/5ML IJ SOLN
INTRAMUSCULAR | Status: AC
  Filled 2017-07-09: qty 5

## 2017-07-09 MED ORDER — NYSTATIN 100000 UNIT/GM EX CREA: 1.0000 "application " | TOPICAL_CREAM | Freq: Two times a day (BID) | CUTANEOUS | 0 refills | Status: DC

## 2017-07-09 MED ORDER — ROCURONIUM BROMIDE 100 MG/10ML IV SOLN
INTRAVENOUS | Status: AC
  Filled 2017-07-09: qty 1

## 2017-07-09 MED ORDER — DEXAMETHASONE SODIUM PHOSPHATE 10 MG/ML IJ SOLN
INTRAMUSCULAR | Status: DC | PRN
  Administered 2017-07-09: 10 mg via INTRAVENOUS

## 2017-07-09 MED ORDER — PROMETHAZINE HCL 25 MG/ML IJ SOLN: 6.2500 mg | INTRAMUSCULAR | Status: DC | PRN

## 2017-07-09 MED ORDER — PROPOFOL 10 MG/ML IV BOLUS
INTRAVENOUS | Status: AC
  Filled 2017-07-09: qty 20

## 2017-07-09 MED ORDER — MIDAZOLAM HCL 2 MG/2ML IJ SOLN
INTRAMUSCULAR | Status: AC
  Filled 2017-07-09: qty 2

## 2017-07-09 MED ORDER — ACETAMINOPHEN 325 MG PO TABS
650.0000 mg | ORAL_TABLET | ORAL | Status: DC | PRN
  Administered 2017-07-09: 650 mg via ORAL
  Filled 2017-07-09: qty 2

## 2017-07-09 MED ORDER — LACTATED RINGERS IV SOLN
INTRAVENOUS | Status: DC
  Administered 2017-07-09: 08:00:00 via INTRAVENOUS
  Administered 2017-07-09: 125 mL/h via INTRAVENOUS

## 2017-07-09 MED ORDER — SCOPOLAMINE 1 MG/3DAYS TD PT72
1.0000 | MEDICATED_PATCH | Freq: Once | TRANSDERMAL | Status: DC
  Administered 2017-07-09: 1.5 mg via TRANSDERMAL

## 2017-07-09 MED ORDER — HYDROMORPHONE HCL 1 MG/ML IJ SOLN
0.2500 mg | INTRAMUSCULAR | Status: DC | PRN
  Administered 2017-07-09 (×2): 0.5 mg via INTRAVENOUS

## 2017-07-09 SURGICAL SUPPLY — 50 items
ADH SKN CLS APL DERMABOND .7 (GAUZE/BANDAGES/DRESSINGS) ×1
APL SRG 38 LTWT LNG FL B (MISCELLANEOUS)
APPLICATOR ARISTA FLEXITIP XL (MISCELLANEOUS) IMPLANT
BLADE SURG 10 STRL SS (BLADE) ×4 IMPLANT
CABLE HIGH FREQUENCY MONO STRZ (ELECTRODE) IMPLANT
COVER LIGHT HANDLE  1/PK (MISCELLANEOUS) ×1
COVER LIGHT HANDLE 1/PK (MISCELLANEOUS) ×3 IMPLANT
COVER MAYO STAND STRL (DRAPES) ×4 IMPLANT
DERMABOND ADVANCED (GAUZE/BANDAGES/DRESSINGS) ×1
DERMABOND ADVANCED .7 DNX12 (GAUZE/BANDAGES/DRESSINGS) ×3 IMPLANT
DURAPREP 26ML APPLICATOR (WOUND CARE) ×4 IMPLANT
GAUZE SPONGE 4X4 16PLY XRAY LF (GAUZE/BANDAGES/DRESSINGS) ×4 IMPLANT
GLOVE BIOGEL PI IND STRL 7.0 (GLOVE) ×18 IMPLANT
GLOVE BIOGEL PI INDICATOR 7.0 (GLOVE) ×6
GLOVE ECLIPSE 6.5 STRL STRAW (GLOVE) ×8 IMPLANT
GOWN STRL REUS W/TWL LRG LVL3 (GOWN DISPOSABLE) ×24 IMPLANT
HEMOSTAT ARISTA ABSORB 3G PWDR (MISCELLANEOUS) ×4 IMPLANT
LIGASURE VESSEL 5MM BLUNT TIP (ELECTROSURGICAL) ×4 IMPLANT
NEEDLE INSUFFLATION 120MM (ENDOMECHANICALS) ×4 IMPLANT
NS IRRIG 1000ML POUR BTL (IV SOLUTION) ×4 IMPLANT
OCCLUDER COLPOPNEUMO (BALLOONS) ×4 IMPLANT
PACK LAPAROSCOPY BASIN (CUSTOM PROCEDURE TRAY) ×4 IMPLANT
PACK TRENDGUARD 450 HYBRID PRO (MISCELLANEOUS) ×3 IMPLANT
PACK TRENDGUARD 600 HYBRD PROC (MISCELLANEOUS) IMPLANT
POUCH LAPAROSCOPIC INSTRUMENT (MISCELLANEOUS) ×4 IMPLANT
PROTECTOR NERVE ULNAR (MISCELLANEOUS) ×8 IMPLANT
SCISSORS LAP 5X35 DISP (ENDOMECHANICALS) ×4 IMPLANT
SET CYSTO W/LG BORE CLAMP LF (SET/KITS/TRAYS/PACK) ×4 IMPLANT
SET IRRIG TUBING LAPAROSCOPIC (IRRIGATION / IRRIGATOR) ×4 IMPLANT
SET TRI-LUMEN FLTR TB AIRSEAL (TUBING) ×4 IMPLANT
SHEARS HARMONIC ACE PLUS 36CM (ENDOMECHANICALS) ×4 IMPLANT
SUT VIC AB 0 CT1 27 (SUTURE) ×8
SUT VIC AB 0 CT1 27XBRD ANBCTR (SUTURE) ×6 IMPLANT
SUT VICRYL 4-0 PS2 18IN ABS (SUTURE) ×8 IMPLANT
SUT VLOC 180 0 9IN  GS21 (SUTURE) ×1
SUT VLOC 180 0 9IN GS21 (SUTURE) ×3 IMPLANT
SYR 10ML LL (SYRINGE) ×4 IMPLANT
SYR 50ML LL SCALE MARK (SYRINGE) ×8 IMPLANT
TIP UTERINE 5.1X6CM LAV DISP (MISCELLANEOUS) IMPLANT
TIP UTERINE 6.7X10CM GRN DISP (MISCELLANEOUS) ×4 IMPLANT
TIP UTERINE 6.7X6CM WHT DISP (MISCELLANEOUS) IMPLANT
TIP UTERINE 6.7X8CM BLUE DISP (MISCELLANEOUS) IMPLANT
TOWEL OR 17X24 6PK STRL BLUE (TOWEL DISPOSABLE) ×8 IMPLANT
TRENDGUARD 450 HYBRID PRO PACK (MISCELLANEOUS) ×4
TRENDGUARD 600 HYBRID PROC PK (MISCELLANEOUS)
TROCAR ADV FIXATION 5X100MM (TROCAR) ×4 IMPLANT
TROCAR PORT AIRSEAL 5X120 (TROCAR) ×4 IMPLANT
TROCAR XCEL NON BLADE 8MM B8LT (ENDOMECHANICALS) ×4 IMPLANT
TROCAR XCEL NON-BLD 5MMX100MML (ENDOMECHANICALS) ×4 IMPLANT
WARMER LAPAROSCOPE (MISCELLANEOUS) ×4 IMPLANT

## 2017-07-09 NOTE — Progress Notes (Signed)
Day of Surgery Procedure(s) (LRB): TOTAL LAPAROSCOPIC HYSTERECTOMY WITH SALPINGECTOMY (Bilateral) CYSTOSCOPY (N/A) INTRAUTERINE DEVICE (IUD) REMOVAL (N/A)  Subjective: Patient reports good pain control, no nausea.  Has voided and walked.  Has eaten regular diet.    Procedure and findings reviewed.  Objective: I have reviewed patient's vital signs, intake and output, medications and labs.  General: alert and cooperative Resp: clear to auscultation bilaterally Cardio: regular rate and rhythm, S1, S2 normal, no murmur, click, rub or gallop GI: soft, non-tender; bowel sounds normal; no masses,  no organomegaly and incision: clean, dry and intact Extremities: extremities normal, atraumatic, no cyanosis or edema Vaginal Bleeding: minimal  Assessment: s/p Procedure(s): TOTAL LAPAROSCOPIC HYSTERECTOMY WITH SALPINGECTOMY (Bilateral) CYSTOSCOPY (N/A) INTRAUTERINE DEVICE (IUD) REMOVAL (N/A): stable and progressing well  Plan: Discharge home  LOS: 0 days    Megan Salon 07/09/2017, 6:43 PM

## 2017-07-09 NOTE — Transfer of Care (Signed)
Immediate Anesthesia Transfer of Care Note  Patient: Caroline Hayes, Caroline Hayes  Procedure(s) Performed: TOTAL LAPAROSCOPIC HYSTERECTOMY WITH SALPINGECTOMY (Bilateral Abdomen) CYSTOSCOPY (N/A Bladder) INTRAUTERINE DEVICE (IUD) REMOVAL (N/A Vagina )  Patient Location: PACU  Anesthesia Type:General  Level of Consciousness: sedated  Airway & Oxygen Therapy: Patient Spontanous Breathing and Patient connected to nasal cannula oxygen  Post-op Assessment: Report given to RN  Post vital signs: Reviewed and stable  Last Vitals:  Vitals:   07/09/17 0614  BP: 117/87  Pulse: (!) 49  Resp: 16  Temp: 36.7 C  SpO2: 99%    Last Pain:  Vitals:   07/09/17 0614  TempSrc: Oral      Patients Stated Pain Goal: 3 (24/26/83 4196)  Complications: No apparent anesthesia complications

## 2017-07-09 NOTE — Anesthesia Postprocedure Evaluation (Signed)
Anesthesia Post Note  Patient: Loss adjuster, chartered  Procedure(s) Performed: TOTAL LAPAROSCOPIC HYSTERECTOMY WITH SALPINGECTOMY (Bilateral Abdomen) CYSTOSCOPY (N/A Bladder) INTRAUTERINE DEVICE (IUD) REMOVAL (N/A Vagina )     Patient location during evaluation: PACU Anesthesia Type: General Level of consciousness: awake and alert Pain management: pain level controlled Vital Signs Assessment: post-procedure vital signs reviewed and stable Respiratory status: spontaneous breathing, nonlabored ventilation, respiratory function stable and patient connected to nasal cannula oxygen Cardiovascular status: blood pressure returned to baseline and stable Postop Assessment: no apparent nausea or vomiting Anesthetic complications: no    Last Vitals:  Vitals:   07/09/17 1045 07/09/17 1100  BP: 104/65   Pulse: (!) 49   Resp: 14   Temp:  36.6 C  SpO2: 100%     Last Pain:  Vitals:   07/09/17 1100  TempSrc:   PainSc: 4    Pain Goal: Patients Stated Pain Goal: 3 (07/09/17 3818)               Tiajuana Amass

## 2017-07-09 NOTE — Op Note (Addendum)
07/09/2017  9:56 AM  PATIENT:  Caroline Hayes  42 y.o. female  PRE-OPERATIVE DIAGNOSIS:  Abnornal Uterine Bleeding, dysmenorrhea, uterine fibroid  POST-OPERATIVE DIAGNOSIS:  Abnornal Uterine Bleeding, dysmenorrhea, uterine fibroid  PROCEDURE:  Procedure(s): TOTAL LAPAROSCOPIC HYSTERECTOMY WITH SALPINGECTOMY CYSTOSCOPY  possible INTRAUTERINE DEVICE (IUD) REMOVAL  SURGEON:  Megan Salon  ASSISTANTS: Sumner Boast, MD   ANESTHESIA:   general  ESTIMATED BLOOD LOSS: 50 mL  BLOOD ADMINISTERED:none   FLUIDS: 1700cc LR  UOP: 200cc  SPECIMEN:  Uterus, cervix, bilateral fallopian tubes  DISPOSITION OF SPECIMEN:  PATHOLOGY  FINDINGS: Enlarged uterus, normal ovaries, normal fallopian tubes  DESCRIPTION OF OPERATION: Patient is taken to the operating room. She is placed in the supine position. She is a running IV in place. Informed consent was present on the chart. SCDs on her lower extremities and functioning properly. Patient was positioned while she was awake.  Her legs were placed in the low lithotomy position in [place]. Her arms were tucked by the side.  General endotracheal anesthesia was administered by the anesthesia staff without difficulty.  Time out performed.    Chlora prep was then used to prep the abdomen and Betadine was used to prep the inner thighs, perineum and vagina. Once 3 minutes had past the patient was draped in a normal standard fashion. The legs were lifted to the high lithotomy position. The cervix was visualized by placing a heavy weighted speculum in the posterior aspect of the vagina and using a curved Deaver retractor to the retract anteriorly. The anterior lip of the cervix was grasped with single-tooth tenaculum.  The cervix sounded to 10 cm. Pratt dilators were used to dilate the cervix up to a #21. A RUMI uterine manipulator was obtained. A #10 disposable tip was placed on the RUMI manipulator as well as a 3.5 silver KOH ring. This was passed through  the cervix and the bulb of the disposable tip was inflated with 10 cc of normal saline. There was a good fit of the KOH ring around the cervix. The tenaculum was removed. There is also good manipulation of the uterus. The speculum and retractor were removed as well. A Foley catheter was placed to straight drain.  Clear urine was noted. Legs were lowered to the low lithotomy position and attention was turned the abdomen.  The umbilicus was everted.  A Veress needle was obtained. Syringe of sterile saline was placed on a open Veress needle.  This was passed into the umbilicus until just when the fluid started to drip.  Then low flow CO2 gas was attached the needle and the pneumoperitoneum was achieved without difficulty. Once four liters of gas was in the abdomen the Veress needle was removed and a 5 millimeter non-bladed Optiview trocar and port were passed directly to the abdomen. The laparoscope was then used to confirm intraperitoneal placement. A large uterus with large fibroid was noted.  Ovaries were normal.  Locations for RLQ, LLQ, and suprapubic ports were noted by transillumination of the abdominal wall.  0.25% marcaine was used to anesthetize the skin.  61mm skin incision was made in the RLQ and an AirSeal port was placed underdirect visualization of the laparoscope.  Then a 30mm skin incision was made and a 8mm nonbladed trochar and port was placed in the LLQ.  Finally, and 51mm skin incision was made about 4cm above the pubic symphasis and an 77mm non-bladed port was placed with direct visualization of the laparoscope.  All trochars were removed.  Ureters were identifies.  Attention was turned to the left side. With uterus on stretch the left tube was excised off the ovary and mesosalpinx was dissected to free the tube. Then the left utero-ovarian pedicle was serially clamped cauterized and incised using the ligasure device. Left round ligament was serially clamped cauterized and incised. The anterior  and posterior peritoneum of the inferior leaf of the broad ligament were opened. The beginning of the baldde flap was created.  The bladder was taken down below the level of the KOH ring. The left uterine artery skeletonized and then just superior to the KOH ring this vessel was serially clamped, cauterized, and incised.  Attention was turned the right side.  The uterus was placed on stretch to the opposite side.  The tube was excised off the ovary using sharp dissection a bipolar cautery.  The mesosalpinx was incised freeing the tube. Then the right uterine ovarian pedicle was serially clamped cauterized and incised. Next the right round ligament was serially clamped cauterized and incised. The anterior posterior peritoneum of the inferiorly for the broad ligament were opened. The anterior peritoneum was carried across to the dissection on the left side. The remainder of the bladder flap was created using sharp dissection. The bladder was well below the level of the KOH ring. The left uterine artery skeletonized. Then the left uterine artery, above the level of the KOH ring, was serially clamped cauterized and incised. The uterus was devascularized at this point.  The colpotomy was performed a starting in the midline and using a harmonic scalpel with the inferior edge of the open blade  This was carried around a circumferential fashion until the vaginal mucosa was completely incised in the specimen was freed.  The specimen was then delivered into the vagina.  It was too large to remove.  Attention was turned towards the vagina.  Legs were elevated.  Heavy weighted speculum was placed in the posterior vagina.  Uterus was grasped with a single toothed tenaculum.  Morcellation of uterus was performed in the vagina until the uterus was small enough to remove vaginally.  IUD was present in the endometrial cavity and was removed with the uterus.  A vaginal occlusive device was placed used to maintain the  pneumoperitoneum.  Uterus weight 291gm.  Attention was turned back to the abdomen.  Gown and gloves were changes.  Instruments were changed with a needle driver and million dollar graspers.  Using a 9 inch V. lock suture, the cuff was closed by incorporating the anterior and posterior vaginal mucosa in each stitch. This was carried across all the way to the left corner and a running fashion. Two stitches were brought back towards the midline and the suture was cut flush with the vagina. The needle was brought out the pelvis. The pelvis was irrigated. All pedicles were inspected. No bleeding was noted. Pneumoperitoneum was decreased to pressure of 35mmg Hg and cuff and all pedicles inspected.  No bleeding was noted.  Arista was placed allong all cut surfaces.  At this point the procedure was completed.  The remaining instruments were removed.  LLQ and suprapubic ports were removed. The patient was taken out of Trendelenburg positioning.  Pneumoperitoneum was relieved.  Several deep breaths were given to the patient's trying to any gas the abdomen RLQ and umbilical ports were removed.    The skin was then closed with subcuticular stitches of 3-0 Vicryl. The skin was cleansed Dermabond was applied. Attention was then turned the vagina  and the cuff was inspected. No bleeding was noted. The anterior posterior vaginal mucosa was incorporated in each stitch. The Foley catheter was removed.  Cystoscopy was performed.  No sutures or bladder injuries were noted.  Entire bladder was visualized including bubble on the dome of the bladder.  Ureters were noted with normal urine jets from each one was seen.  Foley was left out after the cystoscopic fluid was drained and cystoscope removed.  Sponge, lap, needle, initially counts were correct x2. Patient tolerated the procedure very well. She was awakened from anesthesia, extubated and taken to recovery in stable condition.   COUNTS:  YES  PLAN OF CARE: Transfer to  PACU

## 2017-07-09 NOTE — Anesthesia Procedure Notes (Signed)
Procedure Name: Intubation Date/Time: 07/09/2017 7:31 AM Performed by: Asher Muir, CRNA Pre-anesthesia Checklist: Patient identified, Emergency Drugs available, Suction available and Patient being monitored Patient Re-evaluated:Patient Re-evaluated prior to induction Oxygen Delivery Method: Circle system utilized and Simple face mask Preoxygenation: Pre-oxygenation with 100% oxygen Induction Type: IV induction Ventilation: Mask ventilation without difficulty Laryngoscope Size: Mac and 3 Grade View: Grade II Tube type: Oral Tube size: 7.0 mm Number of attempts: 1 Airway Equipment and Method: Stylet Placement Confirmation: ETT inserted through vocal cords under direct vision,  positive ETCO2 and breath sounds checked- equal and bilateral Secured at: 20 (right lip) cm Tube secured with: Tape Dental Injury: Teeth and Oropharynx as per pre-operative assessment

## 2017-07-09 NOTE — Progress Notes (Signed)
Discharge instructions reviewed with patient and significant other.  Questions asked and answered. Escorted patient via wheel chair to car.

## 2017-07-09 NOTE — Discharge Instructions (Signed)

## 2017-07-09 NOTE — Discharge Summary (Signed)
Physician Discharge Summary  Patient ID: Caroline Hayes MRN: [id number] DOB/AGE: 01/20/1976 42 y.o.  Admit date: 07/09/2017 Discharge date: 07/09/2017  Admission Diagnoses: menorrhagia, uterine fibroids, failed IUD use  Discharge Diagnoses:  Active Problems:   * No active hospital problems. *   Discharged Condition: stable  Hospital Course:  Patient admitted through same day surgery.  She was taken to OR where TLH/bilateral salpingectomy/cystoscopy were performed.  Surgical findings included enlarged uterus.  Surgery was uneventful.  EBL 50cc.  Foley catheter was removed before leaving OR.  Patient transferred to PACU where she was stable and then to 3rd floor for the remainder of her hospitalization.  During her post-op recovery, her vitals and stable and she was AF.  In evening of POD#0, she was able to transition to oral pain medications and regular diet.  She was able to ambulate and she had good pain control.  She was also able to void on her own.  Post op hb was 13.4, decreased from 14.6, pre-operatively.   She desired discharge which I felt was appropriate.    Consults: None  Significant Diagnostic Studies: labs: post op hb 13.4  Treatments: surgery: TLH/bilateral salpingectomy/cystoscopy  Discharge Exam: Blood pressure (!) 116/59, pulse (!) 52, temperature 98.3 F (36.8 C), temperature source Oral, resp. rate 14, height 5\' 7"  (1.702 m), weight 183 lb 10.3 oz (83.3 kg), SpO2 99 %. General appearance: alert and cooperative Resp: clear to auscultation bilaterally Cardio: regular rate and rhythm, S1, S2 normal, no murmur, click, rub or gallop GI: soft, non-tender; bowel sounds normal; no masses,  no organomegaly Extremities: extremities normal, atraumatic, no cyanosis or edema Incision/Wound: C/D/I  Disposition: Final discharge disposition not confirmed   Allergies as of 07/09/2017      Reactions   Barley Green Itching   Eggs Or Egg-derived Products Nausea Only   Ginger  Other (See Comments)   Nose itchy   Imitrex [sumatriptan Base] Nausea Only, Swelling      Medication List    STOP taking these medications   naproxen sodium 220 MG tablet Commonly known as:  ALEVE     TAKE these medications   fluticasone 50 MCG/ACT nasal spray Commonly known as:  FLONASE Place 1 spray into both nostrils daily as needed (for allergies.).   ibuprofen 800 MG tablet Commonly known as:  ADVIL,MOTRIN Take 1 tablet (800 mg total) by mouth every 8 (eight) hours as needed.   loratadine 10 MG tablet Commonly known as:  CLARITIN Take 10 mg by mouth daily as needed for allergies.   nystatin cream Commonly known as:  MYCOSTATIN Apply 1 application topically 2 (two) times daily. Apply to affected area BID for up to 7 days.   omeprazole 20 MG tablet Commonly known as:  PRILOSEC OTC Take 20 mg by mouth daily as needed (for acid reflux/indigestion.).   oxyCODONE-acetaminophen 5-325 MG tablet Commonly known as:  PERCOCET Take 1-2 tablets by mouth every 4 (four) hours as needed. use only as much as needed to relieve pain      Follow-up Information    Megan Salon, MD Follow up on 07/15/2017.   Specialty:  Gynecology Why:  appt time 3:15pm Contact information: [address] [place] [address] [phone]           Signed: Megan Salon 07/09/2017, 7:04 PM

## 2017-07-09 NOTE — H&P (Signed)
Annaelle Moldovan is an 42 y.o. female G0 SWF here for definitive treatment of menorrhagia and uterine fibroids.  She has undergone ultrasound for evaluation and been on OCPs, POPs and has a Mirena IUD currently.  She has failed all of these and desires definitive treatment.  Risks, benefits, alternatives have been discussed.  Pt here, questions answered.  She is ready to proceed.  Pertinent Gynecological History: Menses: heavy abnd irregular Bleeding: menorrhagia Contraception: IUD DES exposure: denies Blood transfusions: none Sexually transmitted diseases: no past history Previous GYN Procedures: none  Last mammogram: n/a Date: n/a Last pap: normal Date: 6/16 OB History: G0, P0   Menstrual History: No LMP recorded. Patient is not currently having periods (Reason: IUD).    Past Medical History:  Diagnosis Date  . Anxiety   . Chlamydia 2000  . Depression   . Dyspareunia   . Family history of adverse reaction to anesthesia    mother with nausea and vomiting  . Fibroid   . GERD (gastroesophageal reflux disease)    occasional, mild  . Headache    due to bnirth control pills  . Heart murmur   . 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 MVP (mitral valve prolapse)   . Rubella immune 2010    Past Surgical History:  Procedure Laterality Date  . ROOT CANAL  07/2014  . WISDOM TOOTH EXTRACTION  1999    Family History  Problem Relation Age of Onset  . Hypertension Mother   . Anemia Mother   . Hypertension Maternal Grandmother   . Cancer Sister   . Heart attack Paternal Grandfather   . Breast cancer Maternal Aunt 17       x2 recurence age 71    Social History:  reports that  has never smoked. she has never used smokeless tobacco. She reports that she drinks about 3.0 - 4.2 oz of alcohol per week. She reports that she does not use drugs.  Allergies:  Allergies  Allergen Reactions  . Barley Green Itching  . Eggs Or Egg-Derived Products Nausea Only  . Ginger  Other (See Comments)    Nose itchy  . Imitrex [Sumatriptan Base] Nausea Only and Swelling    Facility-Administered Medications Prior to Admission  Medication Dose Route Frequency Provider Last Rate Last Dose  . levonorgestrel (MIRENA) 20 MCG/24HR IUD   Intrauterine Once Terrance Mass, MD       Medications Prior to Admission  Medication Sig Dispense Refill Last Dose  . fluticasone (FLONASE) 50 MCG/ACT nasal spray Place 1 spray into both nostrils daily as needed (for allergies.).    Past Week at Unknown time  . loratadine (CLARITIN) 10 MG tablet Take 10 mg by mouth daily as needed for allergies.   Past Week at Unknown time  . naproxen sodium (ALEVE) 220 MG tablet Take 220-440 mg by mouth 2 (two) times daily as needed (for pain.).   Past Week at Unknown time  . norethindrone-ethinyl estradiol (LOESTRIN FE 1/20) 1-20 MG-MCG tablet Take continuous active pills. Will need 4 packs for 3 month supply. (Patient taking differently: Take 1 tablet by mouth daily. Take continuous active pills. Will need 4 packs for 3 month supply.) 4 Package 0 07/08/2017 at Unknown time  . omeprazole (PRILOSEC OTC) 20 MG tablet Take 20 mg by mouth daily as needed (for acid reflux/indigestion.).   Past Week at Unknown time    Review of Systems  All other systems reviewed and are  negative.   Blood pressure 117/87, pulse (!) 49, temperature 98.1 F (36.7 C), temperature source Oral, resp. rate 16, SpO2 99 %. Physical Exam  Constitutional: She is oriented to person, place, and time. She appears well-developed and well-nourished.  Cardiovascular: Normal rate and regular rhythm.  Respiratory: Effort normal and breath sounds normal.  Neurological: She is alert and oriented to person, place, and time.  Skin: Skin is warm and dry.  Psychiatric: She has a normal mood and affect.    Results for orders placed or performed during the hospital encounter of 07/09/17 (from the past 24 hour(s))  Pregnancy, urine     Status:  None   Collection Time: 07/09/17  6:00 AM  Result Value Ref Range   Preg Test, Ur NEGATIVE NEGATIVE    No results found.  Assessment/Plan: 42 yo G0 SWF here for definitive treatment of menorrhagia, failed conservative management.  Procedure reviewed.  Questions answered.  Pt ready to proceed.  Megan Salon 07/09/2017, 7:07 AM

## 2017-07-10 ENCOUNTER — Encounter (HOSPITAL_COMMUNITY): Payer: Self-pay | Admitting: Obstetrics & Gynecology

## 2017-07-15 ENCOUNTER — Encounter: Payer: Self-pay | Admitting: Obstetrics & Gynecology

## 2017-07-15 ENCOUNTER — Ambulatory Visit (INDEPENDENT_AMBULATORY_CARE_PROVIDER_SITE_OTHER): Payer: Managed Care, Other (non HMO) | Admitting: Obstetrics & Gynecology

## 2017-07-15 ENCOUNTER — Ambulatory Visit: Payer: Managed Care, Other (non HMO) | Admitting: Obstetrics and Gynecology

## 2017-07-15 VITALS — BP 116/64 | HR 56 | Resp 16

## 2017-07-15 DIAGNOSIS — Z9889 Other specified postprocedural states: Secondary | ICD-10-CM

## 2017-07-15 NOTE — Progress Notes (Signed)
Post Operative Visit  Procedure: Total Laparoscopic Hysterectomy with Salpingectomy, cystoscopy, IUD removal.  Days Post-op: 6  Subjective: Doing well.  No vaginal bleeding.  Has only taken 4 Vicodin tablets.  Taking motrin some.  Voiding easily.  Having regular bowel movements.  Has been outside walking.  Sleep has been good.  Energy is pretty good.    Objective: BP 116/64 (BP Location: Right Arm, Patient Position: Sitting, Cuff Size: Large)   Pulse (!) 56   Resp 16   LMP 03/18/2017 (Exact Date)   EXAM General: alert and cooperative Resp: clear to auscultation bilaterally Cardio: regular rate and rhythm, S1, S2 normal, no murmur, click, rub or gallop GI: soft, non-tender; bowel sounds normal; no masses,  no organomegaly and incision: clean, dry and intact Extremities: extremities normal, atraumatic, no cyanosis or edema Vaginal Bleeding: none  Assessment: s/p TLH/bilateral salpingectomy/cystoscopy  Plan: Recheck 4 weeks

## 2017-07-22 ENCOUNTER — Telehealth: Payer: Self-pay | Admitting: Obstetrics & Gynecology

## 2017-07-22 NOTE — Telephone Encounter (Signed)
Spoke with patient. S/p TLH 07/09/17.   Reports surgical glue is coming off of incision just below the umbilicus, is this OK?   Denies d/c, bleeding, redness, odor or swelling at incision. States incision is closed and healing.   Advised patient surgical glue will start to come off. May wash with mild soap, keep area clean and dry. May apply gauze or Band-Aid if area touches/rubs against clothing. Return call to office with any additional questions/concerns.   Advised patient Dr. Sabra Heck will review, I will return call with any additional recommendations.   Routing to provider for final review. Patient is agreeable to disposition. Will close encounter.

## 2017-07-22 NOTE — Telephone Encounter (Signed)
Patient called requesting to speak with the nurse about the surgical glue coming off her surgical incision site. Patient had surgery on 07/09/17.

## 2017-07-26 ENCOUNTER — Telehealth: Payer: Self-pay | Admitting: Obstetrics & Gynecology

## 2017-07-26 NOTE — Telephone Encounter (Signed)
Patient had a TLH with salpingectomy, cystoscopy and IUD removal on 07/09/2017. Went to the restroom 20 minutes ago and had a teaspoon on bright red blood with wiping. Denies any pain, cramping, fever, chills, lower back pain, or urinary symptoms. "I have had brown spotting but this is bright red bleeding." Has not checked on bleeding since she was last in the restroom. Is available to be seen if needed.Advised will review with MD and return call.

## 2017-07-26 NOTE — Telephone Encounter (Signed)
Patient said she had surgery recently and just went to the bathroom and noticed blood.

## 2017-07-26 NOTE — Telephone Encounter (Signed)
Spoke with patient. Advised reviewed this with Dr.Miller who states this can be a part of the normal healing process post operatively. Certain movements, a cough. sneeze, bowel movement, etc may cause this to occur but this usually resolves without any problem. No concerns for infection. Offered for patient to be seen today for further evaluation. Patient feels comfortable monitoring. Will call if she has any worsening or new symptoms.   Routing to provider for final review. Patient agreeable to disposition.

## 2017-08-06 ENCOUNTER — Ambulatory Visit (INDEPENDENT_AMBULATORY_CARE_PROVIDER_SITE_OTHER): Payer: Managed Care, Other (non HMO) | Admitting: Obstetrics & Gynecology

## 2017-08-06 ENCOUNTER — Encounter: Payer: Self-pay | Admitting: Obstetrics & Gynecology

## 2017-08-06 ENCOUNTER — Ambulatory Visit (INDEPENDENT_AMBULATORY_CARE_PROVIDER_SITE_OTHER): Payer: Managed Care, Other (non HMO)

## 2017-08-06 ENCOUNTER — Telehealth: Payer: Self-pay | Admitting: Obstetrics and Gynecology

## 2017-08-06 ENCOUNTER — Other Ambulatory Visit: Payer: Self-pay

## 2017-08-06 VITALS — BP 134/72 | HR 64 | Resp 14 | Ht 67.25 in | Wt 182.0 lb

## 2017-08-06 DIAGNOSIS — Z9889 Other specified postprocedural states: Secondary | ICD-10-CM

## 2017-08-06 DIAGNOSIS — N939 Abnormal uterine and vaginal bleeding, unspecified: Secondary | ICD-10-CM | POA: Diagnosis not present

## 2017-08-06 NOTE — Progress Notes (Signed)
Post Operative Visit  Procedure:TLH/bilateral salpingectomy, cystoscopy Days post-op:  4 weeks  Subjective: Pt here with complaint of vaginal bleeding.  Had some cramping over the weekend and then some spotting yesterday.  Had had almost no bleeding since surgery.  Reports today, having increased bleeding with some passage of old appearing clots.  Denies fever.  Feels she probably did too much over the weekend and is wondering if this is a side effect of that.  Denies changes with bowel or bladder function.  Objective: BP 134/72 (BP Location: Right Arm, Patient Position: Sitting, Cuff Size: Normal)   Pulse 64   Resp 14   Ht 5' 7.25" (1.708 m)   Wt 182 lb (82.6 kg)   LMP 03/18/2017 (Exact Date)   BMI 28.29 kg/m   EXAM General: alert and cooperative Resp: clear to auscultation bilaterally Cardio: regular rate and rhythm, S1, S2 normal, no murmur, click, rub or gallop GI: soft, non-tender; bowel sounds normal; no masses,  no organomegaly and incision: clean, dry and intact Extremities: extremities normal, atraumatic, no cyanosis or edema Vaginal Bleeding: dark clot and old appearing blood present GYN:  NAEFG, vaginal without lesions, cuff intact, no active bleeding present.  Dark blood with dark clot present.  No odor present.  Non tender to exam to bimanual exam of cuff as well.  Ultrasound is performed.  No hematoma present.  Normal ovaries present.  Non tender exam.  Assessment: S/p TLH/bilateral salpingectomy, cystoscopy Possible cuff hematoma with opening of hematoma today and passage of clot  Plan: This patient is nontender cuff is intact and there is no active bleeding as well as no odor, feels okay to monitor the patient.  She knows that her bleeding should taper off very quickly and if it does not needs to call back.  She also needs to monitor for fevers carefully.  Patient has a postop appointment 2 weeks.  I will follow-up with her in another few days to make sure her  bleeding has indeed improved.  I am getting her to have pelvic rest for 12 weeks postOP.

## 2017-08-06 NOTE — Telephone Encounter (Addendum)
07/09/2017 Total Laparoscopic Hysterectomy with Salpingectomy, cystoscopy, IUD removal. Paitent states that she has been having vaginal bleeding for 2 days. Reports this is like the flow of a menses. Is having lower abdominal pain. Denies heavy bleeding, fever, or chills. Advised will need to be seen for further evaluation. Appointment scheduled for today at 11:10 am with Dr.Miller. Patient is agreeable to date and time.  Routing to provider for final review. Patient agreeable to disposition. Will close encounter.

## 2017-08-06 NOTE — Telephone Encounter (Signed)
Patient called stating she recently had surgery and she has been experiencing bleeding for the last two days. She requested to speak with a nurse.  Last seen: 07/15/17 by Dr. Sabra Heck. Patient stated she normally sees Dr. Quincy Simmonds.

## 2017-08-09 ENCOUNTER — Telehealth: Payer: Self-pay | Admitting: *Deleted

## 2017-08-09 NOTE — Telephone Encounter (Signed)
Megan Salon, MD  P [facility] Triage Pool Can you call pt and get update about bleeding that occurred earlier this week? Thanks.

## 2017-08-09 NOTE — Telephone Encounter (Signed)
Spoke with patient. States she initially noticed increase in bleeding on Tuesday after OV, bleeding decreased to spotting and has continued to lighten every day since. Taking Aleve prn for pain.   Denies pain, odor or fever/chills.   Advised patient I will update Dr. Sabra Heck, is aware to return call to office with any questions/concerns.   Routing to Dr. Sabra Heck, will close encounter.

## 2017-08-19 ENCOUNTER — Ambulatory Visit: Payer: Managed Care, Other (non HMO) | Admitting: Obstetrics and Gynecology

## 2017-08-19 ENCOUNTER — Encounter: Payer: Self-pay | Admitting: Obstetrics & Gynecology

## 2017-08-19 ENCOUNTER — Ambulatory Visit (INDEPENDENT_AMBULATORY_CARE_PROVIDER_SITE_OTHER): Payer: Managed Care, Other (non HMO) | Admitting: Obstetrics & Gynecology

## 2017-08-19 VITALS — BP 120/68 | HR 60 | Resp 16 | Wt 181.0 lb

## 2017-08-19 DIAGNOSIS — N898 Other specified noninflammatory disorders of vagina: Secondary | ICD-10-CM

## 2017-08-19 NOTE — Progress Notes (Signed)
Post Operative Visit  Procedure: Total Laparoscopic Hysterectomy with Salpingectomy. Cystoscopy, IUD removal.  Days Post-op: 41  Subjective: Doing well.  Back at work full time.  Bleeding/spotting has stopped.  No pain.  Bowel and bladder function is normal.  Has a few questions related to recent bleeding.    Objective: BP 120/68 (BP Location: Right Arm, Patient Position: Sitting, Cuff Size: Normal)   Pulse 60   Resp 16   Wt 181 lb (82.1 kg)   LMP 03/18/2017 (Exact Date)   BMI 28.14 kg/m   EXAM General: alert, cooperative and no distress Resp: clear to auscultation bilaterally Cardio: regular rate and rhythm, S1, S2 normal, no murmur, click, rub or gallop GI: soft, non-tender; bowel sounds normal; no masses,  no organomegaly and incision: clean, dry and intact Extremities: extremities normal, atraumatic, no cyanosis or edema Vaginal Bleeding: none  Gyn:  NAEFG, vaginal pink without lesions, cuff well approximated, discharge present, cuff intact on physical exam without masses or fullness  Assessment: s/p TLH/bilateral salpingectomy, cystoscopy Vaginal discharge after probable cuff hematoma cause bleeding about two weeks ago  Plan: Affirm pending.  Results will be called to pt. Recheck 4 weeks.  Recommend continued pelvic rest until at least that time.  All questions answered.

## 2017-08-20 LAB — VAGINITIS/VAGINOSIS, DNA PROBE
Candida Species: NEGATIVE
Gardnerella vaginalis: NEGATIVE
Trichomonas vaginosis: NEGATIVE

## 2017-09-20 ENCOUNTER — Encounter: Payer: Self-pay | Admitting: Obstetrics & Gynecology

## 2017-09-20 ENCOUNTER — Other Ambulatory Visit: Payer: Self-pay

## 2017-09-20 ENCOUNTER — Ambulatory Visit (INDEPENDENT_AMBULATORY_CARE_PROVIDER_SITE_OTHER): Payer: Managed Care, Other (non HMO) | Admitting: Obstetrics & Gynecology

## 2017-09-20 VITALS — BP 104/70 | HR 56 | Wt 180.8 lb

## 2017-09-20 DIAGNOSIS — Z9889 Other specified postprocedural states: Secondary | ICD-10-CM

## 2017-09-20 MED ORDER — METRONIDAZOLE 0.75 % EX CREA: TOPICAL_CREAM | Freq: Two times a day (BID) | CUTANEOUS | 0 refills | Status: DC

## 2017-09-20 NOTE — Progress Notes (Signed)
Post Operative Visit  Procedure:Total Laparoscopic Hysterectomy with Salpingectomy. Cystoscopy, IUD removal.  Days Post-op: 73  Subjective: Doing well.  Denies vaginal bleeding.  Has a URI that she's had for five days.  Having a lot of allergy issues.  Taking a 12 hour decongestant, non drowsy formula.    Having some issues with bowels not feeling "quite normal".  Wonders if this is related to surgery.  Having regular bowel movements.  Having some increased constipation.  Has several questions that she asks today.  Would like RF for metronidazole cream 0.75% that she uses as needed for rosacea.  Objective: BP 104/70 (BP Location: Right Arm, Patient Position: Sitting, Cuff Size: Normal)   Pulse (!) 56   Wt 180 lb 12.8 oz (82 kg)   LMP 03/18/2017 (Exact Date)   BMI 28.11 kg/m   EXAM General: alert and no distress Resp: clear to auscultation bilaterally Cardio: regular rate and rhythm, S1, S2 normal, no murmur, click, rub or gallop GI: soft, non-tender; bowel sounds normal; no masses,  no organomegaly and incision: clean, dry and intact Extremities: extremities normal, atraumatic, no cyanosis or edema Vaginal Bleeding: none  Gyn:  NAEFG, cuff healing well, no masses or fullness  Assessment: s/p TLH/Bilateral salpingectomy/cystoscopy Constipation post-op  Plan: May assume SA 12 weeks post op. Pt is going to try align for a month Return for AEX 1 year

## 2017-10-14 ENCOUNTER — Ambulatory Visit (INDEPENDENT_AMBULATORY_CARE_PROVIDER_SITE_OTHER): Payer: Managed Care, Other (non HMO)

## 2017-10-14 ENCOUNTER — Encounter: Payer: Self-pay | Admitting: Physician Assistant

## 2017-10-14 ENCOUNTER — Ambulatory Visit (INDEPENDENT_AMBULATORY_CARE_PROVIDER_SITE_OTHER): Payer: Managed Care, Other (non HMO) | Admitting: Physician Assistant

## 2017-10-14 ENCOUNTER — Other Ambulatory Visit: Payer: Self-pay

## 2017-10-14 VITALS — BP 110/70 | HR 50 | Temp 97.5°F | Resp 16 | Ht 68.5 in | Wt 184.8 lb

## 2017-10-14 DIAGNOSIS — Z23 Encounter for immunization: Secondary | ICD-10-CM | POA: Diagnosis not present

## 2017-10-14 DIAGNOSIS — S6991XA Unspecified injury of right wrist, hand and finger(s), initial encounter: Secondary | ICD-10-CM

## 2017-10-14 NOTE — Patient Instructions (Addendum)
  Soak your finger nail in warm salt water to encourage blood flow and possible expulsion of the foreign body.  Come back and see me in one week.  Advil and/or tylenol for pain.   Thank you for coming in today. I hope you feel we met your needs.  Feel free to call PCP if you have any questions or further requests.  Please consider signing up for MyChart if you do not already have it, as this is a great way to communicate with me.  Best,  Whitney McVey, PA-C  IF you received an x-ray today, you will receive an invoice from [facility]. Please contact [facility] at [phone] with questions or concerns regarding your invoice.   IF you received labwork today, you will receive an invoice from [facility]. Please contact [facility] at [phone] with questions or concerns regarding your invoice.   Our billing staff will not be able to assist you with questions regarding bills from these companies.  You will be contacted with the lab results as soon as they are available. The fastest way to get your results is to activate your My Chart account. Instructions are located on the last page of this paperwork. If you have not heard from [place] regarding the results in 2 weeks, please contact this office.

## 2017-10-14 NOTE — Progress Notes (Signed)
Caroline Hayes  MRN: [id number] DOB: [birth year]  PCP: Patient, No Pcp Per  Subjective:  Pt is a pleasant 42 year old female who presents to clinic for nail problem x 1 week. 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 She does not recall a  specific MOI. The next day she noticed something under the nail of her right ring finger. Denies pain, swelling, warmth, redness, fever, chills, drainage.   Not utd on tdap.    Review of Systems  Constitutional: Negative for chills, diaphoresis, fatigue and fever.  Musculoskeletal: Negative for arthralgias and joint swelling.  Skin: Positive for wound.  Neurological: Negative for weakness and numbness.    Patient Active Problem List   Diagnosis Date Noted  . MVP (mitral valve prolapse) 01/17/2012  . 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 fluticasone (FLONASE) 50 MCG/ACT nasal spray Place 1 spray into both nostrils daily as needed (for allergies.).     Marland Kitchen loratadine (CLARITIN) 10 MG tablet Take 10 mg by mouth daily as needed for allergies.    . metroNIDAZOLE (METROCREAM) 0.75 % cream Apply topically 2 (two) times daily. 45 g 0  . naproxen sodium (ALEVE) 220 MG tablet Take 220 mg by mouth 2 (two) times daily as needed.    Marland Kitchen omeprazole (PRILOSEC OTC) 20 MG tablet Take 20 mg by mouth daily as needed (for acid reflux/indigestion.).    Marland Kitchen nystatin cream (MYCOSTATIN) Apply 1 application topically 2 (two) times daily. Apply to affected area BID for up to 7 days. (Patient not taking: Reported on 10/14/2017) 30 g 0   No current facility-administered medications on file prior to visit.     Allergies  Allergen Reactions  . Barley Green Itching  . Eggs Or Egg-Derived Products Nausea Only  . Ginger Other (See Comments)    Nose itchy  . Imitrex [Sumatriptan Base] Nausea Only and Swelling     Objective:  BP 110/70   Pulse (!) 50    Temp (!) 97.5 F (36.4 C) (Oral)   Resp 16   Ht 5' 8.5" (1.74 m)   Wt 184 lb 12.8 oz (83.8 kg)   LMP 03/18/2017 (Exact Date)   SpO2 98%   BMI 27.69 kg/m   Physical Exam  Constitutional: She is oriented to person, place, and time. No distress.  Neurological: She is alert and oriented to person, place, and time.  Skin: Skin is warm and dry. Lesion noted.  < 1 cm FB underneath distal medial nail of right 4th digit. No erythema, swelling, drainage, warmth, fluctuance.   Psychiatric: Judgment normal.  Vitals reviewed.    Dg Finger Ring Right  Result Date: 10/14/2017 CLINICAL DATA:  Foreign body under nail for 1 week. EXAM: RIGHT RING FINGER 2+V COMPARISON:  None. FINDINGS: There are two tiny radiopaque densities underneath the nail, but superficial to the bone, along the ulnar side of finger. No osseous findings are evident. IMPRESSION: Radiopaque foreign bodies underneath the nail.  No osseous findings. Electronically Signed   By: Staci Righter M.D.   On: 10/14/2017 16:34    Assessment and Plan :  1. Injury of nail bed of finger of right hand, initial encounter 2. Need for Tdap vaccination - DG Finger Ring Right; Future - Tdap administered by CMA - pt presents with possible FB under nail of her right ring finger from unknown  MOI. There is no concern for skin or soft tissue infection today. X-ray shows radiopaque FB with no involvement of her bone. tdap administered by CMA. Plan to watch and wait. Advised warm soaks and wound care discussed. RTC in 1 week for recheck, consider lifting nail to retreive FB is symptoms worsen.    Mercer Pod, PA-C  [facility] [place] 10/14/2017 3:51 PM

## 2017-11-29 ENCOUNTER — Ambulatory Visit: Payer: Managed Care, Other (non HMO) | Admitting: Nurse Practitioner

## 2017-12-20 ENCOUNTER — Ambulatory Visit: Payer: Managed Care, Other (non HMO) | Admitting: Obstetrics and Gynecology

## 2018-05-16 NOTE — Progress Notes (Signed)
42 y.o. G0P0 Single Caucasian female here for annual exam.    Pain with intercourse and states bilateral breast pain. She feels the breast pain is hormonal.  Ovaries noted to be normal at the time of hysterectomy.  Happy to have done hysterectomy.  Still has some discomfort abdominally occasionally, for example if she voids quickly.  Some discomfort with intercourse, about 4 out of 5 times. No lubricant or position changes tried yet. Can even feel a discomfort internally with masturbation.    States some bowel differences this year.  Has constipation symptoms and gas pains. Increasing fiber.    Saw allergy specialist with [facility].   PCP: None    Patient's last menstrual period was 03/18/2017 (exact date).           Sexually active: Yes.    The current method of family planning is status post hysterectomy.   Final cervical pathology - benign.  Exercising: Yes.    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 negative BMW:UXLKG  Colonoscopy:  n/a BMD:   n/a  Result  n/a TDaP: 10-14-17 Gardasil:   no HIV:Neg per patient Hep C:Neg Per patient Screening Labs:   --- Flu vaccine - not done yet.   reports that she has never smoked. She has never used smokeless tobacco. She reports that she drinks about 5.0 standard drinks of alcohol per week. She reports that she does not use drugs.  Past Medical History:  Diagnosis Date  . Anxiety   . Chlamydia 2000  . Depression   . Dyspareunia   . Family history of adverse reaction to anesthesia    mother with nausea and vomiting  . Fibroid   . GERD (gastroesophageal reflux disease)    occasional, mild  . Headache    due to bnirth control pills  . Heart murmur   . 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 MVP (mitral valve prolapse)   . Rubella immune 2010    Past  Surgical History:  Procedure Laterality Date  . CYSTOSCOPY N/A 07/09/2017   Procedure: CYSTOSCOPY;  Surgeon: Megan Salon, MD;  Location: [facility];  Service: Gynecology;  Laterality: N/A;  . IUD REMOVAL N/A 07/09/2017   Procedure: INTRAUTERINE DEVICE (IUD) REMOVAL;  Surgeon: Megan Salon, MD;  Location: [facility];  Service: Gynecology;  Laterality: N/A;  . ROOT CANAL  07/2014  . TOTAL LAPAROSCOPIC HYSTERECTOMY WITH SALPINGECTOMY Bilateral 07/09/2017   Procedure: TOTAL LAPAROSCOPIC HYSTERECTOMY WITH SALPINGECTOMY;  Surgeon: Megan Salon, MD;  Location: [facility];  Service: Gynecology;  Laterality: Bilateral;  . WISDOM TOOTH EXTRACTION  1999    Current Outpatient Medications  Medication Sig Dispense Refill  . DYMISTA 137-50 MCG/ACT SUSP Place 1 spray into the nose daily.  0  . fluticasone (FLONASE) 50 MCG/ACT nasal spray Place 1 spray into both nostrils daily as needed (for allergies.).     Marland Kitchen loratadine (CLARITIN) 10 MG tablet Take 10 mg by mouth daily as needed for allergies.    . metroNIDAZOLE (METROCREAM) 0.75 % cream Apply topically 2 (two) times daily. 45 g 0  . naproxen sodium (ALEVE) 220 MG tablet Take 220 mg by mouth 2 (two) times daily as needed.    . nystatin cream (MYCOSTATIN) Apply 1 application topically 2 (two) times daily. Apply  to affected area BID for up to 7 days. 30 g 0  . omeprazole (PRILOSEC OTC) 20 MG tablet Take 20 mg by mouth daily as needed (for acid reflux/indigestion.).    Marland Kitchen montelukast (SINGULAIR) 10 MG tablet Take 1 tablet by mouth as needed.  0   No current facility-administered medications for this visit.     Family History  Problem Relation Age of Onset  . Hypertension Mother   . Anemia Mother   . Hypertension Maternal Grandmother   . Cancer Sister   . Heart attack Paternal Grandfather   . Breast cancer Maternal Aunt 46       x2 recurence age 79    Review of Systems  All other systems reviewed and are negative.   Exam:   BP 112/60 (BP Location: Right  Arm, Patient Position: Sitting, Cuff Size: Large)   Pulse (!) 50   Resp 14   Ht 5\' 7"  (1.702 m)   Wt 189 lb (85.7 kg)   LMP 03/18/2017 (Exact Date)   BMI 29.60 kg/m     General appearance: alert, cooperative and appears stated age Head: Normocephalic, without obvious abnormality, atraumatic Neck: no adenopathy, supple, symmetrical, trachea midline and thyroid normal to inspection and palpation Lungs: clear to auscultation bilaterally Breasts: normal appearance, no masses or tenderness, No nipple retraction or dimpling, No nipple discharge or bleeding, No axillary or supraclavicular adenopathy Heart: regular rate and rhythm Abdomen: soft, non-tender; no masses, no organomegaly Extremities: extremities normal, atraumatic, no cyanosis or edema Skin: Skin color, texture, turgor normal. No rashes or lesions Lymph nodes: Cervical, supraclavicular, and axillary nodes normal. No abnormal inguinal nodes palpated Neurologic: Grossly normal  Pelvic: External genitalia:  no lesions              Urethra:  normal appearing urethra with no masses, tenderness or lesions              Bartholins and Skenes: normal                 Vagina: normal appearing vagina with normal color and discharge, no lesions              Cervix:  absent              Pap taken: No. Bimanual Exam:  Uterus:   absent              Adnexa: no mass, fullness, tenderness              Rectal exam: Yes.  .  Confirms.              Anus:  normal sphincter tone, no lesions  Chaperone was present for exam.  Assessment:   Well woman visit with normal exam. Status post laparoscopic hysterectomy with bilateral salpingectomy for dysmenorrhea and fibroid, Jan 2019. Post op vaginal cuff hematoma.   Normal cervical cytology.  Hx prior CIN II. Allergies.  Constipation.   Plan: Mammogram screening.  She will schedule.  Recommended self breast awareness. Pap and HR HPV as above. Guidelines for Calcium, Vitamin D, regular exercise  program including cardiovascular and weight bearing exercise. Routine labs.  Flu vaccine recommended.  Restart probiotics. She will establish care with a PCP.  Follow up annually and prn.   After visit summary provided.

## 2018-05-19 ENCOUNTER — Other Ambulatory Visit: Payer: Self-pay | Admitting: Obstetrics and Gynecology

## 2018-05-19 ENCOUNTER — Other Ambulatory Visit: Payer: Self-pay

## 2018-05-19 ENCOUNTER — Encounter: Payer: Self-pay | Admitting: Obstetrics and Gynecology

## 2018-05-19 ENCOUNTER — Ambulatory Visit (INDEPENDENT_AMBULATORY_CARE_PROVIDER_SITE_OTHER): Payer: Managed Care, Other (non HMO) | Admitting: Obstetrics and Gynecology

## 2018-05-19 VITALS — BP 112/60 | HR 50 | Resp 14 | Ht 67.0 in | Wt 189.0 lb

## 2018-05-19 DIAGNOSIS — Z1231 Encounter for screening mammogram for malignant neoplasm of breast: Secondary | ICD-10-CM

## 2018-05-19 DIAGNOSIS — Z01419 Encounter for gynecological examination (general) (routine) without abnormal findings: Secondary | ICD-10-CM | POA: Diagnosis not present

## 2018-05-19 NOTE — Patient Instructions (Signed)

## 2018-05-20 ENCOUNTER — Encounter: Payer: Self-pay | Admitting: Obstetrics and Gynecology

## 2018-05-20 ENCOUNTER — Telehealth: Payer: Self-pay | Admitting: Obstetrics and Gynecology

## 2018-05-20 LAB — COMPREHENSIVE METABOLIC PANEL
A/G RATIO: 1.6 (ref 1.2–2.2)
ALBUMIN: 4 g/dL (ref 3.5–5.5)
ALT: 18 IU/L (ref 0–32)
AST: 13 IU/L (ref 0–40)
Alkaline Phosphatase: 79 IU/L (ref 39–117)
BILIRUBIN TOTAL: 0.4 mg/dL (ref 0.0–1.2)
BUN / CREAT RATIO: 11 (ref 9–23)
BUN: 8 mg/dL (ref 6–24)
CHLORIDE: 105 mmol/L (ref 96–106)
CO2: 23 mmol/L (ref 20–29)
Calcium: 9.1 mg/dL (ref 8.7–10.2)
Creatinine, Ser: 0.73 mg/dL (ref 0.57–1.00)
GFR calc non Af Amer: 103 mL/min/{1.73_m2} (ref >59)
GFR, EST AFRICAN AMERICAN: 119 mL/min/{1.73_m2} (ref >59)
Globulin, Total: 2.5 g/dL (ref 1.5–4.5)
Glucose: 79 mg/dL (ref 65–99)
POTASSIUM: 4.1 mmol/L (ref 3.5–5.2)
SODIUM: 142 mmol/L (ref 134–144)
TOTAL PROTEIN: 6.5 g/dL (ref 6.0–8.5)

## 2018-05-20 LAB — CBC
Hematocrit: 40.2 % (ref 34.0–46.6)
Hemoglobin: 13.9 g/dL (ref 11.1–15.9)
MCH: 30.5 pg (ref 26.6–33.0)
MCHC: 34.6 g/dL (ref 31.5–35.7)
MCV: 88 fL (ref 79–97)
PLATELETS: 191 10*3/uL (ref 150–450)
RBC: 4.56 x10E6/uL (ref 3.77–5.28)
RDW: 11.9 % — ABNORMAL LOW (ref 12.3–15.4)
WBC: 5.9 10*3/uL (ref 3.4–10.8)

## 2018-05-20 LAB — LIPID PANEL
CHOLESTEROL TOTAL: 196 mg/dL (ref 100–199)
Chol/HDL Ratio: 4.8 ratio — ABNORMAL HIGH (ref 0.0–4.4)
HDL: 41 mg/dL (ref >39)
LDL Calculated: 125 mg/dL — ABNORMAL HIGH (ref 0–99)
Triglycerides: 148 mg/dL (ref 0–149)
VLDL CHOLESTEROL CAL: 30 mg/dL (ref 5–40)

## 2018-05-20 LAB — TSH: TSH: 1.69 u[IU]/mL (ref 0.450–4.500)

## 2018-05-20 LAB — VITAMIN D 25 HYDROXY (VIT D DEFICIENCY, FRACTURES): Vit D, 25-Hydroxy: 23.5 ng/mL — ABNORMAL LOW (ref 30.0–100.0)

## 2018-05-20 NOTE — Telephone Encounter (Signed)
Patient sent the following message through [facility].  Hi Starla,     I could not reply to your email. I have faxed the form. Please let me know if you have received it.   Thank you,  Aliahna

## 2018-06-25 DIAGNOSIS — F909 Attention-deficit hyperactivity disorder, unspecified type: Secondary | ICD-10-CM

## 2018-06-25 HISTORY — DX: Attention-deficit hyperactivity disorder, unspecified type: F90.9

## 2018-07-04 ENCOUNTER — Ambulatory Visit
Admission: RE | Admit: 2018-07-04 | Discharge: 2018-07-04 | Disposition: A | Discharge status: Home / Self Care | Payer: Managed Care, Other (non HMO) | Source: Ambulatory Visit | Attending: Obstetrics and Gynecology | Admitting: Obstetrics and Gynecology

## 2018-07-04 ENCOUNTER — Ambulatory Visit (INDEPENDENT_AMBULATORY_CARE_PROVIDER_SITE_OTHER): Payer: Managed Care, Other (non HMO) | Admitting: Family Medicine

## 2018-07-04 ENCOUNTER — Encounter: Payer: Self-pay | Admitting: Family Medicine

## 2018-07-04 VITALS — BP 112/80 | Ht 67.0 in | Wt 180.0 lb

## 2018-07-04 DIAGNOSIS — M79605 Pain in left leg: Secondary | ICD-10-CM | POA: Diagnosis not present

## 2018-07-04 DIAGNOSIS — Z1231 Encounter for screening mammogram for malignant neoplasm of breast: Secondary | ICD-10-CM

## 2018-07-04 DIAGNOSIS — M79604 Pain in right leg: Secondary | ICD-10-CM | POA: Diagnosis not present

## 2018-07-04 NOTE — Progress Notes (Signed)
    CHIEF COMPLAINT / HPI: The last 2 weeks she has had fairly acute onset of some bilateral hip pain with some low back pain.  It comes and goes.  First notable on the right side, then it moved to the left mid back to the right.  Aching in nature.  She feels like it is "in the bone" rather than in the muscle.  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  Does not recall doing anything specifically different although the classes are getting a little harder as they move forward.  To 3 days ago, the pain was at its worst.  It is since improved.  No lower extremity weakness.  Pain is sharp and aching.  Intermittent.  No numbness.  No lower extremity weakness.  Is not had to change her activities.  No disruption of sleep.  REVIEW OF SYSTEMS: See HPI.  No incontinence of bowel or bladder  PERTINENT  PMH / PSH: I have reviewed the patient's medications, allergies, past medical and surgical history, smoking status and updated in the EMR as appropriate. Nonsmoker TAH for menorrhagia  OBJECTIVE: GENERAL: Well-developed female no acute distress Back: No Defect.  Normal Flexion and Hyperextension That Is Painless.  Extremely Flexible Touch Her Toes.  She Has a Small Amount of Pulling Sensation in the Right Posterior Hamstring but No Back Pain. HIPS: Internal and External Rotation Is Full.  Negative Logroll.  Axial Loading Does Not Reproduce Pain.  Fader and Corky Sox Normal.  Mild Tenderness to Palpation of the Right Lateral Greater Trochanteric Bursa Area on the Right.  ASSESSMENT / PLAN:  Diffuse lower extremity pains.  She has the most pain after she tries to do lunges.  I watched her form that she demonstrated for the lunge and she is very far forward with front foot, back foot rotated out.  Says it typically hurts her plantar fascia so she is changed how she does it, rotating the rear foot out to a  45 degrees angle.  She also takes a huge step forward with front foot, but manages to  balance weight between the two legs rather than on forward thigh.   We worked on getting some better form for this particular movement.    I think she is having some general aches and pains from working out.  She is very flexible but has not been regularly active until recently.  We spent greater than 50% of our 25-minute office visit in counseling education regarding these issues.  I do not think there is anything structurally wrong.  Follow-up PRN.  I did recommend that she get her instructor, follow her with form on future activities.

## 2018-07-18 ENCOUNTER — Ambulatory Visit: Payer: 59 | Admitting: Psychology

## 2018-08-12 ENCOUNTER — Ambulatory Visit (INDEPENDENT_AMBULATORY_CARE_PROVIDER_SITE_OTHER): Payer: 59 | Admitting: Psychology

## 2018-08-12 DIAGNOSIS — F900 Attention-deficit hyperactivity disorder, predominantly inattentive type: Secondary | ICD-10-CM | POA: Diagnosis not present

## 2018-08-12 DIAGNOSIS — F3341 Major depressive disorder, recurrent, in partial remission: Secondary | ICD-10-CM | POA: Diagnosis not present

## 2018-08-28 ENCOUNTER — Ambulatory Visit: Payer: Managed Care, Other (non HMO) | Admitting: Obstetrics and Gynecology

## 2018-08-30 ENCOUNTER — Ambulatory Visit (INDEPENDENT_AMBULATORY_CARE_PROVIDER_SITE_OTHER): Payer: Managed Care, Other (non HMO) | Admitting: Family Medicine

## 2018-08-30 ENCOUNTER — Other Ambulatory Visit: Payer: Self-pay

## 2018-08-30 VITALS — BP 117/78 | HR 63 | Temp 97.7°F | Resp 16 | Ht 67.0 in | Wt 188.0 lb

## 2018-08-30 DIAGNOSIS — Z09 Encounter for follow-up examination after completed treatment for conditions other than malignant neoplasm: Secondary | ICD-10-CM

## 2018-08-30 DIAGNOSIS — H9202 Otalgia, left ear: Secondary | ICD-10-CM | POA: Diagnosis not present

## 2018-08-30 DIAGNOSIS — H6592 Unspecified nonsuppurative otitis media, left ear: Secondary | ICD-10-CM | POA: Diagnosis not present

## 2018-08-30 MED ORDER — AMOXICILLIN-POT CLAVULANATE 875-125 MG PO TABS: 1.0000 | ORAL_TABLET | Freq: Two times a day (BID) | ORAL | 0 refills | Status: AC

## 2018-08-30 NOTE — Patient Instructions (Addendum)
If you have lab work done today you will be contacted with your lab results within the next 2 weeks.  If you have not heard from [place] then please contact us. The fastest way to get your results is to register for My Chart.   IF you received an x-ray today, you will receive an invoice from [facility]. Please contact [facility] at [phone] with questions or concerns regarding your invoice.   IF you received labwork today, you will receive an invoice from [facility]. Please contact [facility] at [phone] with questions or concerns regarding your invoice.   Our billing staff will not be able to assist you with questions regarding bills from these companies.  You will be contacted with the lab results as soon as they are available. The fastest way to get your results is to activate your My Chart account. Instructions are located on the last page of this paperwork. If you have not heard from [place] regarding the results in 2 weeks, please contact this office.      Amoxicillin; Clavulanic Acid tablets What is this medicine? AMOXICILLIN; CLAVULANIC ACID (a mox i SIL in; KLAV yoo lan ic AS id) is a penicillin antibiotic. It is used to treat certain kinds of bacterial infections. It will not work for colds, flu, or other viral infections. This medicine may be used for other purposes; ask your health care provider or pharmacist if you have questions. COMMON BRAND NAME(S): Augmentin What should I tell my health care provider before I take this medicine? They need to know if you have any of these conditions: -bowel disease, like colitis -kidney disease -liver disease -mononucleosis -an unusual or allergic reaction to amoxicillin, penicillin, cephalosporin, other antibiotics, clavulanic acid, other medicines, foods, dyes, or preservatives -pregnant or trying to get pregnant -breast-feeding How should I use this medicine? Take this medicine by mouth with a full glass of water.  Follow the directions on the prescription label. Take at the start of a meal. Do not crush or chew. If the tablet has a score line, you may cut it in half at the score line for easier swallowing. Take your medicine at regular intervals. Do not take your medicine more often than directed. Take all of your medicine as directed even if you think you are better. Do not skip doses or stop your medicine early. Talk to your pediatrician regarding the use of this medicine in children. Special care may be needed. Overdosage: If you think you have taken too much of this medicine contact a poison control center or emergency room at once. NOTE: This medicine is only for you. Do not share this medicine with others. What if I miss a dose? If you miss a dose, take it as soon as you can. If it is almost time for your next dose, take only that dose. Do not take double or extra doses. What may interact with this medicine? -allopurinol -anticoagulants -birth control pills -methotrexate -probenecid This list may not describe all possible interactions. Give your health care provider a list of all the medicines, herbs, non-prescription drugs, or dietary supplements you use. Also tell them if you smoke, drink alcohol, or use illegal drugs. Some items may interact with your medicine. What should I watch for while using this medicine? Tell your doctor or health care professional if your symptoms do not improve. Do not treat diarrhea with over the counter products. Contact your doctor if you have diarrhea that lasts more than 2  days or if it is severe and watery. If you have diabetes, you may get a false-positive result for sugar in your urine. Check with your doctor or health care professional. Birth control pills may not work properly while you are taking this medicine. Talk to your doctor about using an extra method of birth control. What side effects may I notice from receiving this medicine? Side effects that you  should report to your doctor or health care professional as soon as possible: -allergic reactions like skin rash, itching or hives, swelling of the face, lips, or tongue -breathing problems -dark urine -fever or chills, sore throat -redness, blistering, peeling or loosening of the skin, including inside the mouth -seizures -trouble passing urine or change in the amount of urine -unusual bleeding, bruising -unusually weak or tired -white patches or sores in the mouth or throat Side effects that usually do not require medical attention (report to your doctor or health care professional if they continue or are bothersome): -diarrhea -dizziness -headache -nausea, vomiting -stomach upset -vaginal or anal irritation This list may not describe all possible side effects. Call your doctor for medical advice about side effects. You may report side effects to FDA at 1-800-FDA-1088. Where should I keep my medicine? Keep out of the reach of children. Store at room temperature below 25 degrees C (77 degrees F). Keep container tightly closed. Throw away any unused medicine after the expiration date. NOTE: This sheet is a summary. It may not cover all possible information. If you have questions about this medicine, talk to your doctor, pharmacist, or health care provider.  2019 Elsevier/Gold Standard (2007-09-04 12:04:30)  Otitis Media, Adult  Otitis media occurs when there is inflammation and fluid in the middle ear. Your middle ear is a part of the ear that contains bones for hearing as well as air that helps send sounds to your brain. What are the causes? This condition is caused by a blockage in the eustachian tube. This tube drains fluid from the ear to the back of the nose (nasopharynx). A blockage in this tube can be caused by an object or by swelling (edema) in the tube. Problems that can cause a blockage include:  A cold or other upper respiratory infection.  Allergies.  An irritant,  such as tobacco smoke.  Enlarged adenoids. The adenoids are areas of soft tissue located high in the back of the throat, behind the nose and the roof of the mouth.  A mass in the nasopharynx.  Damage to the ear caused by pressure changes (barotrauma). What are the signs or symptoms? Symptoms of this condition include:  Ear pain.  A fever.  Decreased hearing.  A headache.  Tiredness (lethargy).  Fluid leaking from the ear.  Ringing in the ear. How is this diagnosed? This condition is diagnosed with a physical exam. During the exam your health care provider will use an instrument called an otoscope to look into your ear and check for redness, swelling, and fluid. He or she will also ask about your symptoms. Your health care provider may also order tests, such as:  A test to check the movement of the eardrum (pneumatic otoscopy). This test is done by squeezing a small amount of air into the ear.  A test that changes air pressure in the middle ear to check how well the eardrum moves and whether the eustachian tube is working (tympanogram). How is this treated? This condition usually goes away on its own within 3-5 days. But  if the condition is caused by a bacteria infection and does not go away own its own, or keeps coming back, your health care provider may:  Prescribe antibiotic medicines to treat the infection.  Prescribe or recommend medicines to control pain. Follow these instructions at home:  Take over-the-counter and prescription medicines only as told by your health care provider.  If you were prescribed an antibiotic medicine, take it as told by your health care provider. Do not stop taking the antibiotic even if you start to feel better.  Keep all follow-up visits as told by your health care provider. This is important. Contact a health care provider if:  You have bleeding from your nose.  There is a lump on your neck.  You are not getting better in 5  days.  You feel worse instead of better. Get help right away if:  You have severe pain that is not controlled with medicine.  You have swelling, redness, or pain around your ear.  You have stiffness in your neck.  A part of your face is paralyzed.  The bone behind your ear (mastoid) is tender when you touch it.  You develop a severe headache. Summary  Otitis media is redness, soreness, and swelling of the middle ear.  This condition usually goes away on its own within 3-5 days.  If the problem does not go away in 3-5 days, your health care provider may prescribe or recommend medicines to treat your symptoms.  If you were prescribed an antibiotic medicine, take it as told by your health care provider. This information is not intended to replace advice given to you by your health care provider. Make sure you discuss any questions you have with your health care provider. Document Released: 03/16/2004 Document Revised: 06/01/2016 Document Reviewed: 06/01/2016 Elsevier Interactive Patient Education  2019 [employer].

## 2018-08-30 NOTE — Progress Notes (Signed)
New Patient--Establish Care  Subjective:  Patient ID: Caroline Hayes, female    DOB: Jun 15, 1976  Age: 43 y.o. MRN: [id number]  CC:  Chief Complaint  Patient presents with  . Sinus Problem    pt states she had a cold about 3x weeks ago, mucus drainage want go away.  . Ear Fullness    x 1 month/ pt states she hears a pressure noise at the end of the day.    HPI Caroline Hayes is a 43 year old female who presents to Establish Care.   Past Medical History:  Diagnosis Date  . Anxiety   . Chlamydia 2000  . Depression   . Dyspareunia   . Family history of adverse reaction to anesthesia    mother with nausea and vomiting  . Fibroid   . GERD (gastroesophageal reflux disease)    occasional, mild  . Headache    due to bnirth control pills  . Heart murmur   . 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 MVP (mitral valve prolapse)   . Rubella immune 2010   Current Status: She has a history allergies, but has been having increased ear pain and discomfort for about a week. She denies fevers, chills, fatigue, recent infections, weight loss, and night sweats. She has not had any headaches, visual changes, dizziness, and falls. No chest pain, heart palpitations, cough and shortness of breath reported. No reports of GI problems such as nausea, vomiting, diarrhea, and constipation. She has no reports of blood in stools, dysuria and hematuria. No depression or anxiety reported.   Past Surgical History:  Procedure Laterality Date  . CYSTOSCOPY N/A 07/09/2017   Procedure: CYSTOSCOPY;  Surgeon: Megan Salon, MD;  Location: [facility];  Service: Gynecology;  Laterality: N/A;  . IUD REMOVAL N/A 07/09/2017   Procedure: INTRAUTERINE DEVICE (IUD) REMOVAL;  Surgeon: Megan Salon, MD;  Location: [facility];  Service: Gynecology;  Laterality: N/A;  . ROOT CANAL  07/2014  . TOTAL LAPAROSCOPIC HYSTERECTOMY WITH SALPINGECTOMY Bilateral 07/09/2017   Procedure: TOTAL LAPAROSCOPIC  HYSTERECTOMY WITH SALPINGECTOMY;  Surgeon: Megan Salon, MD;  Location: [facility];  Service: Gynecology;  Laterality: Bilateral;  . WISDOM TOOTH EXTRACTION  1999    Family History  Problem Relation Age of Onset  . Hypertension Mother   . Anemia Mother   . Hypertension Maternal Grandmother   . Cancer Sister   . Heart attack Paternal Grandfather   . Breast cancer Maternal Aunt 75       x2 recurence age 50    Social History   Socioeconomic History  . Marital status: Single    Spouse name: Not on file  . Number of children: Not on file  . Years of education: Not on file  . Highest education level: Not on file  Occupational History  . Not on file  Social Needs  . Financial resource strain: Not on file  . Food insecurity:    Worry: Not on file    Inability: Not on file  . Transportation needs:    Medical: Not on file    Non-medical: Not on file  Tobacco Use  . Smoking status: Never Smoker  . Smokeless tobacco: Never Used  Substance and Sexual Activity  . Alcohol use: Yes    Alcohol/week: 5.0 standard drinks    Types: 5 Standard drinks or equivalent per week    Comment: sometimes  . Drug use: No  .  Sexual activity: Yes    Partners: Male    Birth control/protection: Surgical  Lifestyle  . Physical activity:    Days per week: Not on file    Minutes per Hayes: Not on file  . Stress: Not on file  Relationships  . Social connections:    Talks on phone: Not on file    Gets together: Not on file    Attends religious service: Not on file    Active member of club or organization: Not on file    Attends meetings of clubs or organizations: Not on file    Relationship status: Not on file  . Intimate partner violence:    Fear of current or ex partner: Not on file    Emotionally abused: Not on file    Physically abused: Not on file    Forced sexual activity: Not on file  Other Topics Concern  . Not on file  Social History Narrative  . Not on file    Outpatient  Medications Prior to Visit  Medication Sig Dispense Refill  . DYMISTA 137-50 MCG/ACT SUSP Place 1 spray into the nose daily.  0  . fluticasone (FLONASE) 50 MCG/ACT nasal spray Place 1 spray into both nostrils daily as needed (for allergies.).     Marland Kitchen loratadine (CLARITIN) 10 MG tablet Take 10 mg by mouth daily as needed for allergies.    . montelukast (SINGULAIR) 10 MG tablet Take 1 tablet by mouth as needed.  0  . naproxen sodium (ALEVE) 220 MG tablet Take 220 mg by mouth 2 (two) times daily as needed.    Marland Kitchen omeprazole (PRILOSEC OTC) 20 MG tablet Take 20 mg by mouth daily as needed (for acid reflux/indigestion.).    Marland Kitchen metroNIDAZOLE (METROCREAM) 0.75 % cream Apply topically 2 (two) times daily. (Patient not taking: Reported on 07/04/2018) 45 g 0  . nystatin cream (MYCOSTATIN) Apply 1 application topically 2 (two) times daily. Apply to affected area BID for up to 7 days. (Patient not taking: Reported on 07/04/2018) 30 g 0   No facility-administered medications prior to visit.     Allergies  Allergen Reactions  . Barley Green Itching  . Eggs Or Egg-Derived Products Nausea Only  . Ginger Other (See Comments)    Nose itchy  . Imitrex [Sumatriptan Base] Nausea Only and Swelling  . Soy Allergy     ROS Review of Systems  Constitutional: Negative.   HENT: Positive for postnasal drip.   Eyes: Positive for pain (bilateral).  Respiratory: Negative.   Cardiovascular: Negative.   Gastrointestinal: Negative.   Endocrine: Negative.   Genitourinary: Negative.   Musculoskeletal: Negative.   Skin: Negative.   Allergic/Immunologic: Negative.   Neurological: Negative.   Hematological: Negative.   Psychiatric/Behavioral: Negative.    Objective:    Physical Exam  Constitutional: She is oriented to person, place, and time. She appears well-developed and well-nourished.  HENT:  Head: Normocephalic and atraumatic.  Eyes: Conjunctivae are normal.  Neck: Normal range of motion. Neck supple.    Cardiovascular: Normal rate, regular rhythm, normal heart sounds and intact distal pulses.  Pulmonary/Chest: Effort normal and breath sounds normal.  Abdominal: Soft. Bowel sounds are normal.  Musculoskeletal: Normal range of motion.  Neurological: She is oriented to person, place, and time. She has normal reflexes.  Skin: Skin is warm and dry.  Psychiatric: She has a normal mood and affect. Her behavior is normal. Judgment and thought content normal.   BP 117/78   Pulse 63  Temp 97.7 F (36.5 C) (Oral)   Resp 16   Ht 5\' 7"  (1.702 m)   Wt 188 lb (85.3 kg)   LMP 03/18/2017 (Exact Date)   SpO2 97%   BMI 29.44 kg/m  Wt Readings from Last 3 Encounters:  08/30/18 188 lb (85.3 kg)  07/04/18 180 lb (81.6 kg)  05/19/18 189 lb (85.7 kg)    There are no preventive care reminders to display for this patient.  There are no preventive care reminders to display for this patient.  Lab Results  Component Value Date   TSH 1.690 05/19/2018   Lab Results  Component Value Date   WBC 5.9 05/19/2018   HGB 13.9 05/19/2018   HCT 40.2 05/19/2018   MCV 88 05/19/2018   PLT 191 05/19/2018   Lab Results  Component Value Date   NA 142 05/19/2018   K 4.1 05/19/2018   CO2 23 05/19/2018   GLUCOSE 79 05/19/2018   BUN 8 05/19/2018   CREATININE 0.73 05/19/2018   BILITOT 0.4 05/19/2018   ALKPHOS 79 05/19/2018   AST 13 05/19/2018   ALT 18 05/19/2018   PROT 6.5 05/19/2018   ALBUMIN 4.0 05/19/2018   CALCIUM 9.1 05/19/2018   Lab Results  Component Value Date   CHOL 196 05/19/2018   Lab Results  Component Value Date   HDL 41 05/19/2018   Lab Results  Component Value Date   LDLCALC 125 (H) 05/19/2018   Lab Results  Component Value Date   TRIG 148 05/19/2018   Lab Results  Component Value Date   CHOLHDL 4.8 (H) 05/19/2018   No results found for: HGBA1C  Assessment & Plan:   1. Allergic otitis media of left ear, unspecified chronicity Left ear swelling, erythema, and pain. We  will initiate Augmentin today.  - amoxicillin-clavulanate (AUGMENTIN) 875-125 MG tablet; Take 1 tablet by mouth 2 (two) times daily for 7 days.  Dispense: 14 tablet; Refill: 0  2. Left ear pain  3. Follow up She will follow up  As needed.   Meds ordered this encounter  Medications  . amoxicillin-clavulanate (AUGMENTIN) 875-125 MG tablet    Sig: Take 1 tablet by mouth 2 (two) times daily for 7 days.    Dispense:  14 tablet    Refill:  0    No orders of the defined types were placed in this encounter.   Referral Orders  No referral(s) requested today    Kathe Becton,  MSN, FNP-C [facility] [place],  [id number] [phone]   Problem List Items Addressed This Visit    None    Visit Diagnoses    Allergic otitis media of left ear, unspecified chronicity    -  Primary   Relevant Medications   amoxicillin-clavulanate (AUGMENTIN) 875-125 MG tablet   Left ear pain       Follow up          Meds ordered this encounter  Medications  . amoxicillin-clavulanate (AUGMENTIN) 875-125 MG tablet    Sig: Take 1 tablet by mouth 2 (two) times daily for 7 days.    Dispense:  14 tablet    Refill:  0    Follow-up: No follow-ups on file.    Azzie Glatter, FNP

## 2018-08-31 DIAGNOSIS — H9202 Otalgia, left ear: Secondary | ICD-10-CM | POA: Insufficient documentation

## 2018-08-31 DIAGNOSIS — H6592 Unspecified nonsuppurative otitis media, left ear: Secondary | ICD-10-CM | POA: Insufficient documentation

## 2018-09-05 DIAGNOSIS — H6983 Other specified disorders of Eustachian tube, bilateral: Secondary | ICD-10-CM | POA: Insufficient documentation

## 2018-09-08 ENCOUNTER — Ambulatory Visit (INDEPENDENT_AMBULATORY_CARE_PROVIDER_SITE_OTHER): Payer: 59 | Admitting: Psychology

## 2018-09-08 DIAGNOSIS — F900 Attention-deficit hyperactivity disorder, predominantly inattentive type: Secondary | ICD-10-CM

## 2018-09-08 DIAGNOSIS — F3341 Major depressive disorder, recurrent, in partial remission: Secondary | ICD-10-CM

## 2018-09-08 DIAGNOSIS — F1010 Alcohol abuse, uncomplicated: Secondary | ICD-10-CM

## 2018-11-04 ENCOUNTER — Other Ambulatory Visit: Payer: Self-pay

## 2018-11-04 ENCOUNTER — Ambulatory Visit (INDEPENDENT_AMBULATORY_CARE_PROVIDER_SITE_OTHER): Payer: 59 | Admitting: Psychiatry

## 2018-11-04 ENCOUNTER — Encounter (HOSPITAL_COMMUNITY): Payer: Self-pay | Admitting: Psychiatry

## 2018-11-04 VITALS — Ht 67.0 in | Wt 185.0 lb

## 2018-11-04 DIAGNOSIS — F900 Attention-deficit hyperactivity disorder, predominantly inattentive type: Secondary | ICD-10-CM | POA: Diagnosis not present

## 2018-11-04 MED ORDER — AMPHETAMINE-DEXTROAMPHET ER 5 MG PO CP24: ORAL_CAPSULE | ORAL | 0 refills | Status: DC

## 2018-11-04 NOTE — Progress Notes (Signed)
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  Patient has been happily married for 20 years has no children.  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  She is having some difficulties at work and that she does not feel she is achieving as much as she could.  She acknowledges that she daydreams, is somewhat scattered and has difficulty focusing.  She has difficulty finishing tasks.  At some level seems to distressed her.  The patient likes what she does for living and likes her peer group.  Her supervisor is very tolerant she likes her.  It is a secure job and she gets paid well.  The patient denies daily depression.  She does describe some mild dysphoria but it is not persistent and does not impair her.  She is sleeping eating well and has good energy.  She does not really describe much problems concentrating.  She enjoys reading music and socializing.  She is a good sense of work.  She denies being suicidal now or ever.  She denies the use of alcohol or drugs.  She has never been psychotic.  She does not describe a clear episode of major depression in the past and she is never had mania symptoms.  She denies generalized anxiety disorder panic disorder or obsessive-compulsive disorder symptomatology.  Patient is medically stable.  She is essentially had no past psychiatric history. The patient grew up in [place] and went to public school.  She actually brought up with her report when she was in second grade.  It indicated she had trouble sitting still and problem focusing.  She likes her teachers pretty well but she has problems with conduct and  was called out because she talks too much.  She difficulty finishing her homework and finishing projects.  Her hand I pronation was pretty good.  Patient had no disruption in her life in terms of her family when she was growing up.  On the other hand she felt neglected in some ways.  When she went to high school she was still just an average student in terms of her grades.  It should be noted that when she had evaluation second grade they describe her as gifted.  The patient did go on to college and got a college degree.  The patient denies any history of any type of abuse that she was growing up.  As an adult these days she describes herself as being somewhat disorganized but having great difficulty finishing tasks.  As an adult she describes herself as someone daydreams does not complete tasks easily.  Associated Signs/Symptoms: Depression Symptoms:  difficulty concentrating, (Hypo) Manic Symptoms:   Anxiety Symptoms:   Psychotic Symptoms:   PTSD Symptoms:   Past Psychiatric History:   Previous Psychotropic Medications:   Substance Abuse History in the last 12 months:    Consequences of Substance Abuse:   Past Medical History:  Past Medical History:  Diagnosis Date  . Anxiety   . Chlamydia 2000  . Depression   . Dyspareunia   . Family history of adverse reaction to anesthesia    mother with nausea and vomiting  . Fibroid   .  GERD (gastroesophageal reflux disease)    occasional, mild  . Headache    due to bnirth control pills  . Heart murmur   . 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 MVP (mitral valve prolapse)   . Rubella immune 2010    Past Surgical History:  Procedure Laterality Date  . CYSTOSCOPY N/A 07/09/2017   Procedure: CYSTOSCOPY;  Surgeon: Megan Salon, MD;  Location: [facility];  Service: Gynecology;  Laterality: N/A;  . IUD REMOVAL N/A 07/09/2017   Procedure: INTRAUTERINE DEVICE (IUD) REMOVAL;  Surgeon: Megan Salon, MD;  Location: [facility];   Service: Gynecology;  Laterality: N/A;  . ROOT CANAL  07/2014  . TOTAL LAPAROSCOPIC HYSTERECTOMY WITH SALPINGECTOMY Bilateral 07/09/2017   Procedure: TOTAL LAPAROSCOPIC HYSTERECTOMY WITH SALPINGECTOMY;  Surgeon: Megan Salon, MD;  Location: [facility];  Service: Gynecology;  Laterality: Bilateral;  . WISDOM TOOTH EXTRACTION  1999    Family Psychiatric History:   Family History:  Family History  Problem Relation Age of Onset  . Hypertension Mother   . Anemia Mother   . Hypertension Maternal Grandmother   . Cancer Sister   . Heart attack Paternal Grandfather   . Breast cancer Maternal Aunt 32       x2 recurence age 35    Social History:   Social History   Socioeconomic History  . Marital status: Single    Spouse name: Not on file  . Number of children: Not on file  . Years of education: Not on file  . Highest education level: Not on file  Occupational History  . Not on file  Social Needs  . Financial resource strain: Not on file  . Food insecurity:    Worry: Not on file    Inability: Not on file  . Transportation needs:    Medical: Not on file    Non-medical: Not on file  Tobacco Use  . Smoking status: Never Smoker  . Smokeless tobacco: Never Used  Substance and Sexual Activity  . Alcohol use: Yes    Alcohol/week: 5.0 standard drinks    Types: 5 Standard drinks or equivalent per week    Comment: sometimes  . Drug use: No  . Sexual activity: Yes    Partners: Male    Birth control/protection: Surgical  Lifestyle  . Physical activity:    Days per week: Not on file    Minutes per session: Not on file  . Stress: Not on file  Relationships  . Social connections:    Talks on phone: Not on file    Gets together: Not on file    Attends religious service: Not on file    Active member of club or organization: Not on file    Attends meetings of clubs or organizations: Not on file    Relationship status: Not on file  Other Topics Concern  . Not on file  Social History  Narrative  . Not on file    Additional Social History:   Allergies:   Allergies  Allergen Reactions  . Barley Green Itching  . Eggs Or Egg-Derived Products Nausea Only  . Ginger Other (See Comments)    Nose itchy  . Imitrex [Sumatriptan Base] Nausea Only and Swelling  . Soy Allergy     Metabolic Disorder Labs: No results found for: HGBA1C, MPG Lab Results  Component Value Date   PROLACTIN 17.0 09/02/2013   PROLACTIN 7.6 03/20/2013   Lab Results  Component  Value Date   CHOL 196 05/19/2018   TRIG 148 05/19/2018   HDL 41 05/19/2018   CHOLHDL 4.8 (H) 05/19/2018   LDLCALC 125 (H) 05/19/2018   Lab Results  Component Value Date   TSH 1.690 05/19/2018    Therapeutic Level Labs: No results found for: LITHIUM No results found for: CBMZ No results found for: VALPROATE  Current Medications: Current Outpatient Medications  Medication Sig Dispense Refill  . DYMISTA 137-50 MCG/ACT SUSP Place 1 spray into the nose daily.  0  . fluticasone (FLONASE) 50 MCG/ACT nasal spray Place 1 spray into both nostrils daily as needed (for allergies.).     Marland Kitchen loratadine (CLARITIN) 10 MG tablet Take 10 mg by mouth daily as needed for allergies.    . montelukast (SINGULAIR) 10 MG tablet Take 1 tablet by mouth as needed.  0  . naproxen sodium (ALEVE) 220 MG tablet Take 220 mg by mouth 2 (two) times daily as needed.    Marland Kitchen omeprazole (PRILOSEC OTC) 20 MG tablet Take 20 mg by mouth daily as needed (for acid reflux/indigestion.).    Marland Kitchen amphetamine-dextroamphetamine (ADDERALL XR) 5 MG 24 hr capsule 2 qam  1  qhs 90 capsule 0   No current facility-administered medications for this visit.     Musculoskeletal: Strength & Muscle Tone: within normal limits Gait & Station: normal Patient leans: N/A  Psychiatric Specialty Exam: ROS  Height 5\' 7"  (1.702 m), weight 185 lb (83.9 kg), last menstrual period 03/18/2017.Body mass index is 28.98 kg/m.  General Appearance: Casual  Eye Contact:  Good  Speech:   Clear and Coherent  Volume:  Normal  Mood:  Negative  Affect:  Congruent  Thought Process:  Coherent  Orientation:  Full (Time, Place, and Person)  Thought Content:  WDL  Suicidal Thoughts:  No  Homicidal Thoughts:  No  Memory:  NA  Judgement:  Good  Insight:  Good  Psychomotor Activity:  Normal  Concentration:  Attention Span: Fair  Recall:  Good  Fund of Knowledge:Good  Language: Good  Akathisia:  No  Handed:  Right  AIMS (if indicated):  not done  Assets:  Desire for Improvement  ADL's:  Intact  Cognition: Normal  Sleep:  Good   Screenings:   Assessment and Plan:   At this time in my opinion this patient demonstrates attention deficit hyperactivity disorder.  I think most likely it is mild and the patient has made some compensations.  Nonetheless it is characteristic that she is having problems achieving the level that she would like to.  I think it is a reasonable approach to trial her on Adderall.  She will begin with 5 mg in the morning for a few days and then increase it to 5 mg XR 1 in the morning and 1 in noon.  After few weeks if she does not feel much of a difference we will increase it to 5 mg XR 2 in the morning and 1 at noon.  This is a low dose for this individual.  The patient was educated to be patient and to notice if she sees any differences in her efficiency and her focus.  This patient to return to see me in 2 months.  At that time we will consider increasing it further if necessary.  She is instructed to call us if there are any problems.   Jerral Ralph, MD 5/12/20204:49 PM

## 2018-11-13 ENCOUNTER — Telehealth (HOSPITAL_COMMUNITY): Payer: Self-pay

## 2018-11-13 NOTE — Telephone Encounter (Signed)
Medication management - Prior Authorization for patient's prescribed D-Amphetamine ER 5 mg initiated online with covermymeds.  Decision should be received within 72 hours.

## 2018-12-23 ENCOUNTER — Telehealth (HOSPITAL_COMMUNITY): Payer: Self-pay

## 2018-12-23 MED ORDER — AMPHETAMINE-DEXTROAMPHET ER 5 MG PO CP24: ORAL_CAPSULE | ORAL | 0 refills | Status: DC

## 2019-01-07 ENCOUNTER — Other Ambulatory Visit: Payer: Self-pay

## 2019-01-07 ENCOUNTER — Ambulatory Visit (INDEPENDENT_AMBULATORY_CARE_PROVIDER_SITE_OTHER): Payer: 59 | Admitting: Psychiatry

## 2019-01-07 DIAGNOSIS — F900 Attention-deficit hyperactivity disorder, predominantly inattentive type: Secondary | ICD-10-CM

## 2019-01-07 MED ORDER — AMPHETAMINE-DEXTROAMPHET ER 10 MG PO CP24: ORAL_CAPSULE | ORAL | 0 refills | Status: DC

## 2019-01-07 NOTE — Progress Notes (Signed)
Psychiatric Initial Adult Assessment   Patient Identification: Caroline Hayes MRN:  [id number] Date of Evaluation:  01/07/2019 Referral Source: Dr Irven Shelling Chief Complaint:   Visit Diagnosis: Attention deficit disorder  History of Present Illness:   At this time the patient has noticed only mild response to her Adderall.  She is up to taking 10 mg XR in the morning and 5 mg at noon.  She does describe some unusual sensations.  She can definitely tell the medicine is in her body.  It seems like she feels different when the medicine drops out but she cannot quite explain how it is different.  She does feel like she is feeling somewhat focused in terms of as if she is had a coffee without the jittery.  The patient is vague in explaining exactly how she is better but she is not really all that much improved.  Her focus is not much improved.  Her finishing tasks is not noticeable.  Unfortunately she spent way too much money and then showed up on this medicine.  The patient denies being depressed.  She denies being irritable.  She denies being psychotic.  She does not have racing thinking or changes in her mood.  Associated Signs/Symptoms: Depression Symptoms:  difficulty concentrating, (Hypo) Manic Symptoms:   Anxiety Symptoms:   Psychotic Symptoms:   PTSD Symptoms:   Past Psychiatric History:   Previous Psychotropic Medications:   Substance Abuse History in the last 12 months:    Consequences of Substance Abuse:   Past Medical History:  Past Medical History:  Diagnosis Date  . Anxiety   . Chlamydia 2000  . Depression   . Dyspareunia   . Family history of adverse reaction to anesthesia    mother with nausea and vomiting  . Fibroid   . GERD (gastroesophageal reflux disease)    occasional, mild  . Headache    due to bnirth control pills  . Heart murmur   . 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 MVP (mitral valve prolapse)   . Rubella immune 2010     Past Surgical History:  Procedure Laterality Date  . CYSTOSCOPY N/A 07/09/2017   Procedure: CYSTOSCOPY;  Surgeon: Megan Salon, MD;  Location: [facility];  Service: Gynecology;  Laterality: N/A;  . IUD REMOVAL N/A 07/09/2017   Procedure: INTRAUTERINE DEVICE (IUD) REMOVAL;  Surgeon: Megan Salon, MD;  Location: [facility];  Service: Gynecology;  Laterality: N/A;  . ROOT CANAL  07/2014  . TOTAL LAPAROSCOPIC HYSTERECTOMY WITH SALPINGECTOMY Bilateral 07/09/2017   Procedure: TOTAL LAPAROSCOPIC HYSTERECTOMY WITH SALPINGECTOMY;  Surgeon: Megan Salon, MD;  Location: [facility];  Service: Gynecology;  Laterality: Bilateral;  . WISDOM TOOTH EXTRACTION  1999    Family Psychiatric History:   Family History:  Family History  Problem Relation Age of Onset  . Hypertension Mother   . Anemia Mother   . Hypertension Maternal Grandmother   . Cancer Sister   . Heart attack Paternal Grandfather   . Breast cancer Maternal Aunt 56       x2 recurence age 57    Social History:   Social History   Socioeconomic History  . Marital status: Single    Spouse name: Not on file  . Number of children: Not on file  . Years of education: Not on file  . Highest education level: Not on file  Occupational History  . Not on file  Social Needs  .  Financial resource strain: Not on file  . Food insecurity    Worry: Not on file    Inability: Not on file  . Transportation needs    Medical: Not on file    Non-medical: Not on file  Tobacco Use  . Smoking status: Never Smoker  . Smokeless tobacco: Never Used  Substance and Sexual Activity  . Alcohol use: Yes    Alcohol/week: 5.0 standard drinks    Types: 5 Standard drinks or equivalent per week    Comment: sometimes  . Drug use: No  . Sexual activity: Yes    Partners: Male    Birth control/protection: Surgical  Lifestyle  . Physical activity    Days per week: Not on file    Minutes per session: Not on file  . Stress: Not on file  Relationships  . Social  Herbalist on phone: Not on file    Gets together: Not on file    Attends religious service: Not on file    Active member of club or organization: Not on file    Attends meetings of clubs or organizations: Not on file    Relationship status: Not on file  Other Topics Concern  . Not on file  Social History Narrative  . Not on file    Additional Social History:   Allergies:   Allergies  Allergen Reactions  . Barley Green Itching  . Eggs Or Egg-Derived Products Nausea Only  . Ginger Other (See Comments)    Nose itchy  . Imitrex [Sumatriptan Base] Nausea Only and Swelling  . Soy Allergy     Metabolic Disorder Labs: No results found for: HGBA1C, MPG Lab Results  Component Value Date   PROLACTIN 17.0 09/02/2013   PROLACTIN 7.6 03/20/2013   Lab Results  Component Value Date   CHOL 196 05/19/2018   TRIG 148 05/19/2018   HDL 41 05/19/2018   CHOLHDL 4.8 (H) 05/19/2018   LDLCALC 125 (H) 05/19/2018   Lab Results  Component Value Date   TSH 1.690 05/19/2018    Therapeutic Level Labs: No results found for: LITHIUM No results found for: CBMZ No results found for: VALPROATE  Current Medications: Current Outpatient Medications  Medication Sig Dispense Refill  . amphetamine-dextroamphetamine (ADDERALL XR) 10 MG 24 hr capsule 1  qam 2 q noon for 1 week then 2 qam  1  q noon 90 capsule 0  . DYMISTA 137-50 MCG/ACT SUSP Place 1 spray into the nose daily.  0  . fluticasone (FLONASE) 50 MCG/ACT nasal spray Place 1 spray into both nostrils daily as needed (for allergies.).     Marland Kitchen loratadine (CLARITIN) 10 MG tablet Take 10 mg by mouth daily as needed for allergies.    . montelukast (SINGULAIR) 10 MG tablet Take 1 tablet by mouth as needed.  0  . naproxen sodium (ALEVE) 220 MG tablet Take 220 mg by mouth 2 (two) times daily as needed.    Marland Kitchen omeprazole (PRILOSEC OTC) 20 MG tablet Take 20 mg by mouth daily as needed (for acid reflux/indigestion.).     No current  facility-administered medications for this visit.     Musculoskeletal: Strength & Muscle Tone: within normal limits Gait & Station: normal Patient leans: N/A  Psychiatric Specialty Exam: ROS  Last menstrual period 03/18/2017.There is no height or weight on file to calculate BMI.  General Appearance: Casual  Eye Contact:  Good  Speech:  Clear and Coherent  Volume:  Normal  Mood:  Negative  Affect:  Congruent  Thought Process:  Coherent  Orientation:  Full (Time, Place, and Person)  Thought Content:  WDL  Suicidal Thoughts:  No  Homicidal Thoughts:  No  Memory:  NA  Judgement:  Good  Insight:  Good  Psychomotor Activity:  Normal  Concentration:  Attention Span: Fair  Recall:  Good  Fund of Knowledge:Good  Language: Good  Akathisia:  No  Handed:  Right  AIMS (if indicated):  not done  Assets:  Desire for Improvement  ADL's:  Intact  Cognition: Normal  Sleep:  Good   Screenings:   Assessment and Plan:   At this time we will go ahead and increase her Adderall to taking Adderall XR 10 mg 1 in the morning and 1 at noon for 1 week and then increase it to 20 mg in the morning and 1 mg at noon.  The patient was told to call us if she notices any other strange sensations.  We will make all the efforts we can to get her at it this medicine at a more reasonable cost.  The patient is instructed to call us but otherwise she will return to see me in approximately 5 to 6 weeks.  Jerral Ralph, MD 7/15/20204:37 PM

## 2019-02-26 ENCOUNTER — Ambulatory Visit (HOSPITAL_COMMUNITY): Payer: 59 | Admitting: Psychiatry

## 2019-02-27 ENCOUNTER — Ambulatory Visit (HOSPITAL_COMMUNITY): Payer: 59 | Admitting: Psychiatry

## 2019-02-27 ENCOUNTER — Other Ambulatory Visit: Payer: Self-pay

## 2019-03-27 ENCOUNTER — Ambulatory Visit (INDEPENDENT_AMBULATORY_CARE_PROVIDER_SITE_OTHER): Payer: 59 | Admitting: Psychiatry

## 2019-03-27 ENCOUNTER — Other Ambulatory Visit: Payer: Self-pay

## 2019-03-27 ENCOUNTER — Encounter (HOSPITAL_COMMUNITY): Payer: Self-pay | Admitting: Psychiatry

## 2019-03-27 VITALS — BP 118/78 | HR 70 | Temp 98.1°F | Ht 67.0 in | Wt 178.0 lb

## 2019-03-27 DIAGNOSIS — F900 Attention-deficit hyperactivity disorder, predominantly inattentive type: Secondary | ICD-10-CM

## 2019-03-27 MED ORDER — AMPHETAMINE-DEXTROAMPHET ER 5 MG PO CP24: ORAL_CAPSULE | ORAL | 0 refills | Status: DC

## 2019-03-27 NOTE — Progress Notes (Signed)
Psychiatric Initial Adult Assessment   Patient Identification: Caroline Hayes MRN:  [id number] Date of Evaluation:  03/27/2019 Referral Source: Dr Irven Shelling Chief Complaint:   Visit Diagnosis: Attention deficit disorder  History of Present Illness:    Today the patient reports that when she increased her Adderall to taking 10 mg twice a day or even higher she became very uncomfortable hyped up was no longer effective in her work.  She described as making her feel dysphoric and on edge.  She herself adjusted the doses and found the right dose to take which she is to take 10 mg on a Monday morning which would give her enough focus and energy for that day and for the next few days and she would supplemented by taking another 5 mg every morning just through the week up until Friday.  She takes the weekends off which is good.  So subsequently she takes between 10 and 5 mg of Adderall and seems to get an adequate effect.  She says it is not profoundly dramatic and not completely effective but she clearly gets a benefit from it.  It does make her focus and it does make her handle monotonous work better.  She is being demented a lot at work with increased projects and activities.  She does find the Adderall to be helpful.  Should continue taking it.  The patient continues to one-to-one therapy with Kirstie Mirza Allcock.  The patient describes mild dysphoria.  In evaluation today she denies symptoms that are consistent with major depression at least at this time.  Generally she is sleeping and eating well has good energy.  She is a good sense of worth.  She drinks no alcohol uses no drugs.   Associated Signs/Symptoms: Depression Symptoms:  difficulty concentrating, (Hypo) Manic Symptoms:   Anxiety Symptoms:   Psychotic Symptoms:   PTSD Symptoms:   Past Psychiatric History:   Previous Psychotropic Medications:   Substance Abuse History in the last 12 months:    Consequences of Substance  Abuse:   Past Medical History:  Past Medical History:  Diagnosis Date  . Anxiety   . Chlamydia 2000  . Depression   . Dyspareunia   . Family history of adverse reaction to anesthesia    mother with nausea and vomiting  . Fibroid   . GERD (gastroesophageal reflux disease)    occasional, mild  . Headache    due to bnirth control pills  . Heart murmur   . 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 MVP (mitral valve prolapse)   . Rubella immune 2010    Past Surgical History:  Procedure Laterality Date  . CYSTOSCOPY N/A 07/09/2017   Procedure: CYSTOSCOPY;  Surgeon: Megan Salon, MD;  Location: [facility];  Service: Gynecology;  Laterality: N/A;  . IUD REMOVAL N/A 07/09/2017   Procedure: INTRAUTERINE DEVICE (IUD) REMOVAL;  Surgeon: Megan Salon, MD;  Location: [facility];  Service: Gynecology;  Laterality: N/A;  . ROOT CANAL  07/2014  . TOTAL LAPAROSCOPIC HYSTERECTOMY WITH SALPINGECTOMY Bilateral 07/09/2017   Procedure: TOTAL LAPAROSCOPIC HYSTERECTOMY WITH SALPINGECTOMY;  Surgeon: Megan Salon, MD;  Location: [facility];  Service: Gynecology;  Laterality: Bilateral;  . WISDOM TOOTH EXTRACTION  1999    Family Psychiatric History:   Family History:  Family History  Problem Relation Age of Onset  . Hypertension Mother   . Anemia Mother   . Hypertension Maternal Grandmother   . Cancer Sister   .  Heart attack Paternal Grandfather   . Breast cancer Maternal Aunt 73       x2 recurence age 72    Social History:   Social History   Socioeconomic History  . Marital status: Soil scientist    Spouse name: Not on file  . Number of children: Not on file  . Years of education: Not on file  . Highest education level: Not on file  Occupational History  . Not on file  Social Needs  . Financial resource strain: Not on file  . Food insecurity    Worry: Not on file    Inability: Not on file  . Transportation needs    Medical: Not on file    Non-medical: Not on file   Tobacco Use  . Smoking status: Never Smoker  . Smokeless tobacco: Never Used  Substance and Sexual Activity  . Alcohol use: Yes    Alcohol/week: 5.0 standard drinks    Types: 5 Standard drinks or equivalent per week    Comment: sometimes  . Drug use: No  . Sexual activity: Yes    Partners: Male    Birth control/protection: Surgical  Lifestyle  . Physical activity    Days per week: Not on file    Minutes per session: Not on file  . Stress: Not on file  Relationships  . Social Herbalist on phone: Not on file    Gets together: Not on file    Attends religious service: Not on file    Active member of club or organization: Not on file    Attends meetings of clubs or organizations: Not on file    Relationship status: Not on file  Other Topics Concern  . Not on file  Social History Narrative  . Not on file    Additional Social History:   Allergies:   Allergies  Allergen Reactions  . Barley Green Itching  . Eggs Or Egg-Derived Products Nausea Only  . Ginger Other (See Comments)    Nose itchy  . Imitrex [Sumatriptan Base] Nausea Only and Swelling  . Soy Allergy     Metabolic Disorder Labs: No results found for: HGBA1C, MPG Lab Results  Component Value Date   PROLACTIN 17.0 09/02/2013   PROLACTIN 7.6 03/20/2013   Lab Results  Component Value Date   CHOL 196 05/19/2018   TRIG 148 05/19/2018   HDL 41 05/19/2018   CHOLHDL 4.8 (H) 05/19/2018   LDLCALC 125 (H) 05/19/2018   Lab Results  Component Value Date   TSH 1.690 05/19/2018    Therapeutic Level Labs: No results found for: LITHIUM No results found for: CBMZ No results found for: VALPROATE  Current Medications: Current Outpatient Medications  Medication Sig Dispense Refill  . amphetamine-dextroamphetamine (ADDERALL XR) 5 MG 24 hr capsule 2 qam on Monday 1 qam on all other days fill after 04/26/2019 35 capsule 0  . loratadine (CLARITIN) 10 MG tablet Take 10 mg by mouth daily as needed for  allergies.    . montelukast (SINGULAIR) 10 MG tablet Take 1 tablet by mouth as needed.  0  . naproxen sodium (ALEVE) 220 MG tablet Take 220 mg by mouth 2 (two) times daily as needed.    Marland Kitchen omeprazole (PRILOSEC OTC) 20 MG tablet Take 20 mg by mouth daily as needed (for acid reflux/indigestion.).    Marland Kitchen DYMISTA 137-50 MCG/ACT SUSP Place 1 spray into the nose daily.  0  . fluticasone (FLONASE) 50 MCG/ACT nasal spray Place 1  spray into both nostrils daily as needed (for allergies.).      No current facility-administered medications for this visit.     Musculoskeletal: Strength & Muscle Tone: within normal limits Gait & Station: normal Patient leans: N/A  Psychiatric Specialty Exam: ROS  Blood pressure 118/78, pulse 70, temperature 98.1 F (36.7 C), height 5\' 7"  (1.702 m), weight 178 lb (80.7 kg), last menstrual period 03/18/2017.Body mass index is 27.88 kg/m.  General Appearance: Casual  Eye Contact:  Good  Speech:  Clear and Coherent  Volume:  Normal  Mood:  Negative  Affect:  Congruent  Thought Process:  Coherent  Orientation:  Full (Time, Place, and Person)  Thought Content:  WDL  Suicidal Thoughts:  No  Homicidal Thoughts:  No  Memory:  NA  Judgement:  Good  Insight:  Good  Psychomotor Activity:  Normal  Concentration:  Attention Span: Fair  Recall:  Good  Fund of Knowledge:Good  Language: Good  Akathisia:  No  Handed:  Right  AIMS (if indicated):  not done  Assets:  Desire for Improvement  ADL's:  Intact  Cognition: Normal  Sleep:  Good   Screenings:   Assessment and Plan:   At this time the patient's diagnosis is attention deficit disorder mainly related to her concentration.  She is not restless or agitated.  I think she benefits by taking Adderall 10 mg on Monday morning and then 5 mg the rest of the week.  She is experimented with this dosage regime and this is worked the best for her.  Is perfectly reasonable to do this.  Is also reasonable to take weekends off  from Adderall.  The patient denies chest pain or shortness of breath.  She denies any physical complaints.  She return to see me in 2 months. Jerral Ralph, MD 10/2/20209:14 AM

## 2019-04-29 ENCOUNTER — Other Ambulatory Visit: Payer: Self-pay

## 2019-04-29 ENCOUNTER — Ambulatory Visit (INDEPENDENT_AMBULATORY_CARE_PROVIDER_SITE_OTHER): Payer: 59 | Admitting: Psychiatry

## 2019-04-29 ENCOUNTER — Encounter (HOSPITAL_COMMUNITY): Payer: Self-pay | Admitting: Psychiatry

## 2019-04-29 DIAGNOSIS — F900 Attention-deficit hyperactivity disorder, predominantly inattentive type: Secondary | ICD-10-CM

## 2019-04-29 MED ORDER — AMPHETAMINE-DEXTROAMPHET ER 5 MG PO CP24: ORAL_CAPSULE | ORAL | 0 refills | Status: DC

## 2019-04-29 NOTE — Progress Notes (Signed)
Psychiatric Initial Adult Assessment   Patient Identification: Caroline Hayes MRN:  [id number] Date of Evaluation:  04/29/2019 Referral Source: Dr Irven Shelling Chief Complaint:   Visit Diagnosis: Attention deficit disorder  History of Present Illness:    Today the patient is doing well.  She takes Adderall 15 mg a day but some days she just takes 10 mg.  5 very get through the week she feels better emotionally and still gets the benefit of a stimulant.  On her last visit she was mildly dysphoric but she is better now.  She denies daily persistent depression or anhedonia.  She is sleeping and eating well.  She knows she needs to exercise more.  She is handling the pandemic fairly well.  Patient has no psychosis.  She drinks no alcohol uses no drugs.  Her work setting is doing well.  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  She likes what she does.  She has no children she is married and has a stable relationship with her partner.  The patient says the Adderall makes her more efficient.  She denies any chest pain or shortness of breath.  Associated Signs/Symptoms: Depression Symptoms:  difficulty concentrating, (Hypo) Manic Symptoms:   Anxiety Symptoms:   Psychotic Symptoms:   PTSD Symptoms:   Past Psychiatric History:   Previous Psychotropic Medications:   Substance Abuse History in the last 12 months:    Consequences of Substance Abuse:   Past Medical History:  Past Medical History:  Diagnosis Date  . Anxiety   . Chlamydia 2000  . Depression   . Dyspareunia   . Family history of adverse reaction to anesthesia    mother with nausea and vomiting  . Fibroid   . GERD (gastroesophageal reflux disease)    occasional, mild  . Headache    due to bnirth control pills  . Heart murmur   . 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 MVP (mitral valve prolapse)   . Rubella immune 2010    Past Surgical History:  Procedure Laterality Date  . CYSTOSCOPY N/A 07/09/2017    Procedure: CYSTOSCOPY;  Surgeon: Megan Salon, MD;  Location: [facility];  Service: Gynecology;  Laterality: N/A;  . IUD REMOVAL N/A 07/09/2017   Procedure: INTRAUTERINE DEVICE (IUD) REMOVAL;  Surgeon: Megan Salon, MD;  Location: [facility];  Service: Gynecology;  Laterality: N/A;  . ROOT CANAL  07/2014  . TOTAL LAPAROSCOPIC HYSTERECTOMY WITH SALPINGECTOMY Bilateral 07/09/2017   Procedure: TOTAL LAPAROSCOPIC HYSTERECTOMY WITH SALPINGECTOMY;  Surgeon: Megan Salon, MD;  Location: [facility];  Service: Gynecology;  Laterality: Bilateral;  . WISDOM TOOTH EXTRACTION  1999    Family Psychiatric History:   Family History:  Family History  Problem Relation Age of Onset  . Hypertension Mother   . Anemia Mother   . Hypertension Maternal Grandmother   . Cancer Sister   . Heart attack Paternal Grandfather   . Breast cancer Maternal Aunt 79       x2 recurence age 17    Social History:   Social History   Socioeconomic History  . Marital status: Soil scientist    Spouse name: Not on file  . Number of children: Not on file  . Years of education: Not on file  . Highest education level: Not on file  Occupational History  . Not on file  Social Needs  . Financial resource strain: Not on file  . Food insecurity  Worry: Not on file    Inability: Not on file  . Transportation needs    Medical: Not on file    Non-medical: Not on file  Tobacco Use  . Smoking status: Never Smoker  . Smokeless tobacco: Never Used  Substance and Sexual Activity  . Alcohol use: Yes    Alcohol/week: 5.0 standard drinks    Types: 5 Standard drinks or equivalent per week    Comment: sometimes  . Drug use: No  . Sexual activity: Yes    Partners: Male    Birth control/protection: Surgical  Lifestyle  . Physical activity    Days per week: Not on file    Minutes per session: Not on file  . Stress: Not on file  Relationships  . Social Herbalist on phone: Not on file    Gets together: Not on file     Attends religious service: Not on file    Active member of club or organization: Not on file    Attends meetings of clubs or organizations: Not on file    Relationship status: Not on file  Other Topics Concern  . Not on file  Social History Narrative  . Not on file    Additional Social History:   Allergies:   Allergies  Allergen Reactions  . Barley Green Itching  . Eggs Or Egg-Derived Products Nausea Only  . Ginger Other (See Comments)    Nose itchy  . Imitrex [Sumatriptan Base] Nausea Only and Swelling  . Soy Allergy     Metabolic Disorder Labs: No results found for: HGBA1C, MPG Lab Results  Component Value Date   PROLACTIN 17.0 09/02/2013   PROLACTIN 7.6 03/20/2013   Lab Results  Component Value Date   CHOL 196 05/19/2018   TRIG 148 05/19/2018   HDL 41 05/19/2018   CHOLHDL 4.8 (H) 05/19/2018   LDLCALC 125 (H) 05/19/2018   Lab Results  Component Value Date   TSH 1.690 05/19/2018    Therapeutic Level Labs: No results found for: LITHIUM No results found for: CBMZ No results found for: VALPROATE  Current Medications: Current Outpatient Medications  Medication Sig Dispense Refill  . amphetamine-dextroamphetamine (ADDERALL XR) 5 MG 24 hr capsule 2 qam on Monday 1 qam on all other days fill after 04/26/2019 35 capsule 0  . DYMISTA 137-50 MCG/ACT SUSP Place 1 spray into the nose daily.  0  . fluticasone (FLONASE) 50 MCG/ACT nasal spray Place 1 spray into both nostrils daily as needed (for allergies.).     Marland Kitchen loratadine (CLARITIN) 10 MG tablet Take 10 mg by mouth daily as needed for allergies.    . montelukast (SINGULAIR) 10 MG tablet Take 1 tablet by mouth as needed.  0  . naproxen sodium (ALEVE) 220 MG tablet Take 220 mg by mouth 2 (two) times daily as needed.    Marland Kitchen omeprazole (PRILOSEC OTC) 20 MG tablet Take 20 mg by mouth daily as needed (for acid reflux/indigestion.).     No current facility-administered medications for this visit.      Musculoskeletal: Strength & Muscle Tone: within normal limits Gait & Station: normal Patient leans: N/A  Psychiatric Specialty Exam: ROS  Last menstrual period 03/18/2017.There is no height or weight on file to calculate BMI.  General Appearance: Casual  Eye Contact:  Good  Speech:  Clear and Coherent  Volume:  Normal  Mood:  Negative  Affect:  Congruent  Thought Process:  Coherent  Orientation:  Full (Time, Place, and  Person)  Thought Content:  WDL  Suicidal Thoughts:  No  Homicidal Thoughts:  No  Memory:  NA  Judgement:  Good  Insight:  Good  Psychomotor Activity:  Normal  Concentration:  Attention Span: Fair  Recall:  Good  Fund of Knowledge:Good  Language: Good  Akathisia:  No  Handed:  Right  AIMS (if indicated):  not done  Assets:  Desire for Improvement  ADL's:  Intact  Cognition: Normal  Sleep:  Good   Screenings:  Today the patient is doing better.  She is diagnosed with attention deficit disorder.  She will take Adderall 10 mg 1 day and 5 mg the next.  Her mood is stable.  The patient will return to see me in 4 months. Assessment and Plan:    Jerral Ralph, MD 11/4/20204:27 PM

## 2019-05-28 NOTE — Progress Notes (Signed)
43 y.o. G0P0 Domestic Partner Caucasian female here for annual exam.    Had negative Covid testing following traveling for Thanksgiving.   New dx of ADHD.   Having constipation.  Bothered by this.  Declines STD testing.   PCP:  None  Psychiatrist:  Dr. Casimiro Needle  Patient's last menstrual period was 03/18/2017 (exact date).          Sexually active: Yes.    The current method of family planning is status post hysterectomy.    Exercising: Yes.    some walking, hiking Smoker:  no  Health Maintenance: Pap:  11-28-16 Neg:Neg HR HPV, 11-24-14 Neg:Neg HR HPV, 03-20-13 Neg History of abnormal Pap:  Yes, LGSIL:Pos HR HPV with several colpo's 2003 for CIN II & 2009?--negative MMG: 07-04-18 Neg/density B/Birads1 Colonoscopy:  n/a BMD:   n/a  Result  n/a TDaP: 10-14-17 Gardasil:   no HIV:Neg per patient Hep C:Neg per patient Screening Labs:  Today.  Flu vaccine completed.   reports that she has never smoked. She has never used smokeless tobacco. She reports current alcohol use. She reports that she does not use drugs.  Past Medical History:  Diagnosis Date  . ADHD 2020  . Anxiety   . Chlamydia 2000  . Depression   . Dyspareunia   . Family history of adverse reaction to anesthesia    mother with nausea and vomiting  . Fibroid   . GERD (gastroesophageal reflux disease)    occasional, mild  . Headache    due to bnirth control pills  . Heart murmur   . 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 MVP (mitral valve prolapse)   . Rubella immune 2010    Past Surgical History:  Procedure Laterality Date  . CYSTOSCOPY N/A 07/09/2017   Procedure: CYSTOSCOPY;  Surgeon: Megan Salon, MD;  Location: [facility];  Service: Gynecology;  Laterality: N/A;  . IUD REMOVAL N/A 07/09/2017   Procedure: INTRAUTERINE DEVICE (IUD) REMOVAL;  Surgeon: Megan Salon, MD;  Location: [facility];  Service: Gynecology;  Laterality: N/A;  . ROOT CANAL  07/2014  . TOTAL LAPAROSCOPIC HYSTERECTOMY WITH  SALPINGECTOMY Bilateral 07/09/2017   Procedure: TOTAL LAPAROSCOPIC HYSTERECTOMY WITH SALPINGECTOMY;  Surgeon: Megan Salon, MD;  Location: [facility];  Service: Gynecology;  Laterality: Bilateral;  . WISDOM TOOTH EXTRACTION  1999    Current Outpatient Medications  Medication Sig Dispense Refill  . amphetamine-dextroamphetamine (ADDERALL XR) 5 MG 24 hr capsule 2 qam on Monday 1 qam on all other days fill after 04/26/2019 35 capsule 0  . atomoxetine (STRATTERA) 40 MG capsule 1 qam for 1 week the 1 qam and 1 at noon therafter 60 capsule 4  . Cholecalciferol (VITAMIN D3) 125 MCG (5000 UT) CAPS Take 2 capsules by mouth daily.    Marland Kitchen DYMISTA 137-50 MCG/ACT SUSP Place 1 spray into the nose daily.  0  . fluticasone (FLONASE) 50 MCG/ACT nasal spray Place 1 spray into both nostrils daily as needed (for allergies.).     Marland Kitchen loratadine (CLARITIN) 10 MG tablet Take 10 mg by mouth daily as needed for allergies.    . montelukast (SINGULAIR) 10 MG tablet Take 1 tablet by mouth as needed.  0  . naproxen sodium (ALEVE) 220 MG tablet Take 220 mg by mouth 2 (two) times daily as needed.    Marland Kitchen omeprazole (PRILOSEC OTC) 20 MG tablet Take 20 mg by mouth daily as needed (for acid reflux/indigestion.).  No current facility-administered medications for this visit.     Family History  Problem Relation Age of Onset  . Hypertension Mother   . Anemia Mother   . Hypertension Maternal Grandmother   . Cancer Sister   . Heart attack Paternal Grandfather   . Breast cancer Maternal Aunt 56       x2 recurence age 23    Review of Systems  Gastrointestinal: Positive for constipation.  All other systems reviewed and are negative.   Exam:   BP 132/76   Pulse (!) 50   Temp (!) 97.4 F (36.3 C) (Temporal)   Resp 12   Ht 5' 7.25" (1.708 m)   Wt 172 lb 9.6 oz (78.3 kg)   LMP 03/18/2017 (Exact Date)   BMI 26.83 kg/m     General appearance: alert, cooperative and appears stated age Head: normocephalic, without obvious  abnormality, atraumatic Neck: no adenopathy, supple, symmetrical, trachea midline and thyroid normal to inspection and palpation Lungs: clear to auscultation bilaterally Breasts: normal appearance, no masses or tenderness, No nipple retraction or dimpling, No nipple discharge or bleeding, No axillary adenopathy Heart: regular rate and rhythm Abdomen: soft, non-tender; no masses, no organomegaly Extremities: extremities normal, atraumatic, no cyanosis or edema Skin: skin color, texture, turgor normal. No rashes or lesions Lymph nodes: cervical, supraclavicular, and axillary nodes normal. Neurologic: grossly normal  Pelvic: External genitalia:  no lesions              No abnormal inguinal nodes palpated.              Urethra:  normal appearing urethra with no masses, tenderness or lesions              Bartholins and Skenes: normal                 Vagina: normal appearing vagina with normal color and discharge, no lesions              Cervix: absent              Pap taken: Yes.   Bimanual Exam:  Uterus:  absent              Adnexa: no mass, fullness, tenderness              Rectal exam: Yes.  .  Confirms.              Anus:  normal sphincter tone, no lesions  Chaperone was present for exam.  Assessment:   Well woman visit with normal exam. Status post laparoscopic hysterectomy with bilateral salpingectomy for dysmenorrhea and fibroid, Jan 2019. Post op vaginal cuff hematoma.   Normal cervical cytology.  Hx prior CIN II. Allergies.  Constipation.   Plan: Mammogram screening discussed. Self breast awareness reviewed. Pap and HR HPV today.  She will need screening every 3 years to complete 25 years of screening.  Guidelines for Calcium, Vitamin D, regular exercise program including cardiovascular and weight bearing exercise. Routine labs.  We discussed constipation.  She will let me know if she would like to see GI. Follow up annually and prn.   After visit summary provided.

## 2019-05-29 ENCOUNTER — Other Ambulatory Visit: Payer: Self-pay

## 2019-05-29 ENCOUNTER — Ambulatory Visit (INDEPENDENT_AMBULATORY_CARE_PROVIDER_SITE_OTHER): Payer: 59 | Admitting: Psychiatry

## 2019-05-29 DIAGNOSIS — F900 Attention-deficit hyperactivity disorder, predominantly inattentive type: Secondary | ICD-10-CM

## 2019-05-29 MED ORDER — ATOMOXETINE HCL 40 MG PO CAPS: ORAL_CAPSULE | ORAL | 4 refills | Status: DC

## 2019-05-29 NOTE — Progress Notes (Signed)
Psychiatric Initial Adult Assessment   Patient Identification: Caroline Hayes MRN:  [id number] Date of Evaluation:  05/29/2019 Referral Source: Dr Irven Shelling Chief Complaint:   Visit Diagnosis: Attention deficit disorder  History of Present Illness:    Today the patientToday the patient seen was to be stable. seems to be stable.  Work is going well.  The patient is working at home and also in the work setting.The patient is working at home and also in work setting.  The patient is not sure that the Adderall has been all that helpful. The patient is not sure that the Adderall has been all that helpful.  He says when she takes 10 mg on Monday it seems to work and when she takes 5 mg through the week she claimed She says when she takes 10 mg oneday seems to work and when she takes 5 mg through week patient.cream she gets constipation.. As she thinks more about it she is not sure the Adderall is all that worth it although She thinks more about it she's not sure the Adderall is all worth it although on the other hand she says when she takes a 10 mg on Monday she is more efficient. On the other hand she says when she takes attempt milligrams on Monday she is more efficient.  At this time she is agreed to discontinue the Adderall. At this time she is agreed to discontinue the Adderall.  I shared with her that it is a short acting medicine I shared with her that it is a short acting medicine because of her constipation and that it was cost of her constipation patient will get better right away. Her constipation will get better right away.  I doubt it is related to the Adderall.  I shared if she is not better she should see her primary care doctor which she is actually going to see in the next week. I doubt is related to the Adderall. I shared she's not better she should see her primary care Dr. Runell Gess is actually going to see him next week.  I sent stay off the Adderall and start Strattera I said stay off  the Adderall and start Strattera and I will see her in 2 months. And I will see her in 2 months.  The patient's health is good.  Financially she is stable.o Patient's health isod. Financially she stable.  In every other way she is doing well.  In every other way she is doing well. G   Associated Signs/Symptoms: Depression Symptoms:  difficulty concentrating, (Hypo) Manic Symptoms:   Anxiety Symptoms:   Psychotic Symptoms:   PTSD Symptoms:   Past Psychiatric History:   Previous Psychotropic Medications:   Substance Abuse History in the last 12 months:    Consequences of Substance Abuse:   Past Medical History:  Past Medical History:  Diagnosis Date  . Anxiety   . Chlamydia 2000  . Depression   . Dyspareunia   . Family history of adverse reaction to anesthesia    mother with nausea and vomiting  . Fibroid   . GERD (gastroesophageal reflux disease)    occasional, mild  . Headache    due to bnirth control pills  . Heart murmur   . 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 MVP (mitral valve prolapse)   . Rubella immune 2010    Past Surgical History:  Procedure Laterality Date  . CYSTOSCOPY N/A 07/09/2017  Procedure: CYSTOSCOPY;  Surgeon: Megan Salon, MD;  Location: [facility];  Service: Gynecology;  Laterality: N/A;  . IUD REMOVAL N/A 07/09/2017   Procedure: INTRAUTERINE DEVICE (IUD) REMOVAL;  Surgeon: Megan Salon, MD;  Location: [facility];  Service: Gynecology;  Laterality: N/A;  . ROOT CANAL  07/2014  . TOTAL LAPAROSCOPIC HYSTERECTOMY WITH SALPINGECTOMY Bilateral 07/09/2017   Procedure: TOTAL LAPAROSCOPIC HYSTERECTOMY WITH SALPINGECTOMY;  Surgeon: Megan Salon, MD;  Location: [facility];  Service: Gynecology;  Laterality: Bilateral;  . WISDOM TOOTH EXTRACTION  1999    Family Psychiatric History:   Family History:  Family History  Problem Relation Age of Onset  . Hypertension Mother   . Anemia Mother   . Hypertension Maternal Grandmother   . Cancer  Sister   . Heart attack Paternal Grandfather   . Breast cancer Maternal Aunt 56       x2 recurence age 44    Social History:   Social History   Socioeconomic History  . Marital status: Soil scientist    Spouse name: Not on file  . Number of children: Not on file  . Years of education: Not on file  . Highest education level: Not on file  Occupational History  . Not on file  Social Needs  . Financial resource strain: Not on file  . Food insecurity    Worry: Not on file    Inability: Not on file  . Transportation needs    Medical: Not on file    Non-medical: Not on file  Tobacco Use  . Smoking status: Never Smoker  . Smokeless tobacco: Never Used  Substance and Sexual Activity  . Alcohol use: Yes    Alcohol/week: 5.0 standard drinks    Types: 5 Standard drinks or equivalent per week    Comment: sometimes  . Drug use: No  . Sexual activity: Yes    Partners: Male    Birth control/protection: Surgical  Lifestyle  . Physical activity    Days per week: Not on file    Minutes per session: Not on file  . Stress: Not on file  Relationships  . Social Herbalist on phone: Not on file    Gets together: Not on file    Attends religious service: Not on file    Active member of club or organization: Not on file    Attends meetings of clubs or organizations: Not on file    Relationship status: Not on file  Other Topics Concern  . Not on file  Social History Narrative  . Not on file    Additional Social History:   Allergies:   Allergies  Allergen Reactions  . Barley Green Itching  . Eggs Or Egg-Derived Products Nausea Only  . Ginger Other (See Comments)    Nose itchy  . Imitrex [Sumatriptan Base] Nausea Only and Swelling  . Soy Allergy     Metabolic Disorder Labs: No results found for: HGBA1C, MPG Lab Results  Component Value Date   PROLACTIN 17.0 09/02/2013   PROLACTIN 7.6 03/20/2013   Lab Results  Component Value Date   CHOL 196 05/19/2018    TRIG 148 05/19/2018   HDL 41 05/19/2018   CHOLHDL 4.8 (H) 05/19/2018   LDLCALC 125 (H) 05/19/2018   Lab Results  Component Value Date   TSH 1.690 05/19/2018    Therapeutic Level Labs: No results found for: LITHIUM No results found for: CBMZ No results found for: VALPROATE  Current Medications: Current  Outpatient Medications  Medication Sig Dispense Refill  . amphetamine-dextroamphetamine (ADDERALL XR) 5 MG 24 hr capsule 2 qam on Monday 1 qam on all other days fill after 04/26/2019 35 capsule 0  . DYMISTA 137-50 MCG/ACT SUSP Place 1 spray into the nose daily.  0  . fluticasone (FLONASE) 50 MCG/ACT nasal spray Place 1 spray into both nostrils daily as needed (for allergies.).     Marland Kitchen loratadine (CLARITIN) 10 MG tablet Take 10 mg by mouth daily as needed for allergies.    . montelukast (SINGULAIR) 10 MG tablet Take 1 tablet by mouth as needed.  0  . naproxen sodium (ALEVE) 220 MG tablet Take 220 mg by mouth 2 (two) times daily as needed.    Marland Kitchen omeprazole (PRILOSEC OTC) 20 MG tablet Take 20 mg by mouth daily as needed (for acid reflux/indigestion.).     No current facility-administered medications for this visit.     Musculoskeletal: Strength & Muscle Tone: within normal limits Gait & Station: normal Patient leans: N/A  Psychiatric Specialty Exam: ROS  Last menstrual period 03/18/2017.There is no height or weight on file to calculate BMI.  General Appearance: Casual  Eye Contact:  Good  Speech:  Clear and Coherent  Volume:  Normal  Mood:  Negative  Affect:  Congruent  Thought Process:  Coherent  Orientation:  Full (Time, Place, and Person)  Thought Content:  WDL  Suicidal Thoughts:  No  Homicidal Thoughts:  No  Memory:  NA  Judgement:  Good  Insight:  Good  Psychomotor Activity:  Normal  Concentration:  Attention Span: Fair  Recall:  Good  Fund of Knowledge:Good  Language: Good  Akathisia:  No  Handed:  Right  AIMS (if indicated):  not done  Assets:  Desire for  Improvement  ADL's:  Intact  Cognition: Normal  Sleep:  Good   Screenings:  Today the patient will discontinue her Adderall .  She will wait 1 week and then begin StratteraShe wait one week and then begin Strattera 40 mg in the morning fort 40 mg ihe morning for 1 week and then increase it to 4 the morning and 40  One week and then increae it to 40 in the morning unit.   And 40 at noon.The patient will see her the patient will see her primary care doctor.rmary care.  This patient will be seen again in approximately 2 months.   Jerral Ralph, MD 12/4/20209:05 AM

## 2019-06-01 ENCOUNTER — Encounter: Payer: Self-pay | Admitting: Obstetrics and Gynecology

## 2019-06-01 ENCOUNTER — Ambulatory Visit (INDEPENDENT_AMBULATORY_CARE_PROVIDER_SITE_OTHER): Payer: Managed Care, Other (non HMO) | Admitting: Obstetrics and Gynecology

## 2019-06-01 ENCOUNTER — Other Ambulatory Visit: Payer: Self-pay

## 2019-06-01 ENCOUNTER — Other Ambulatory Visit (HOSPITAL_COMMUNITY)
Admission: RE | Admit: 2019-06-01 | Discharge: 2019-06-01 | Disposition: A | Discharge status: Home / Self Care | Payer: Managed Care, Other (non HMO) | Source: Ambulatory Visit | Attending: Obstetrics and Gynecology | Admitting: Obstetrics and Gynecology

## 2019-06-01 VITALS — BP 132/76 | HR 50 | Temp 97.4°F | Resp 12 | Ht 67.25 in | Wt 172.6 lb

## 2019-06-01 DIAGNOSIS — Z01419 Encounter for gynecological examination (general) (routine) without abnormal findings: Secondary | ICD-10-CM | POA: Diagnosis not present

## 2019-06-01 NOTE — Patient Instructions (Addendum)
EXERCISE AND DIET:  We recommended that you start or continue a regular exercise program for good health. Regular exercise means any activity that makes your heart beat faster and makes you sweat.  We recommend exercising at least 30 minutes per day at least 3 days a week, preferably 4 or 5.  We also recommend a diet low in fat and sugar.  Inactivity, poor dietary choices and obesity can cause diabetes, heart attack, stroke, and kidney damage, among others.    ALCOHOL AND SMOKING:  Women should limit their alcohol intake to no more than 7 drinks/beers/glasses of wine (combined, not each!) per week. Moderation of alcohol intake to this level decreases your risk of breast cancer and liver damage. And of course, no recreational drugs are part of a healthy lifestyle.  And absolutely no smoking or even second hand smoke. Most people know smoking can cause heart and lung diseases, but did you know it also contributes to weakening of your bones? Aging of your skin?  Yellowing of your teeth and nails?  CALCIUM AND VITAMIN D:  Adequate intake of calcium and Vitamin D are recommended.  The recommendations for exact amounts of these supplements seem to change often, but generally speaking 600 mg of calcium (either carbonate or citrate) and 800 units of Vitamin D per day seems prudent. Certain women may benefit from higher intake of Vitamin D.  If you are among these women, your doctor will have told you during your visit.    PAP SMEARS:  Pap smears, to check for cervical cancer or precancers,  have traditionally been done yearly, although recent scientific advances have shown that most women can have pap smears less often.  However, every woman still should have a physical exam from her gynecologist every year. It will include a breast check, inspection of the vulva and vagina to check for abnormal growths or skin changes, a visual exam of the cervix, and then an exam to evaluate the size and shape of the uterus and  ovaries.  And after 40 years of age, a rectal exam is indicated to check for rectal cancers. We will also provide age appropriate advice regarding health maintenance, like when you should have certain vaccines, screening for sexually transmitted diseases, bone density testing, colonoscopy, mammograms, etc.   MAMMOGRAMS:  All women over 40 years old should have a yearly mammogram. Many facilities now offer a "3D" mammogram, which may cost around $50 extra out of pocket. If possible,  we recommend you accept the option to have the 3D mammogram performed.  It both reduces the number of women who will be called back for extra views which then turn out to be normal, and it is better than the routine mammogram at detecting truly abnormal areas.    COLONOSCOPY:  Colonoscopy to screen for colon cancer is recommended for all women at age 50.  We know, you hate the idea of the prep.  We agree, BUT, having colon cancer and not knowing it is worse!!  Colon cancer so often starts as a polyp that can be seen and removed at colonscopy, which can quite literally save your life!  And if your first colonoscopy is normal and you have no family history of colon cancer, most women don't have to have it again for 10 years.  Once every ten years, you can do something that may end up saving your life, right?  We will be happy to help you get it scheduled when you are ready.    Be sure to check your insurance coverage so you understand how much it will cost.  It may be covered as a preventative service at no cost, but you should check your particular policy.      Constipation, Adult Constipation is when a person has fewer bowel movements in a week than normal, has difficulty having a bowel movement, or has stools that are dry, hard, or larger than normal. Constipation may be caused by an underlying condition. It may become worse with age if a person takes certain medicines and does not take in enough fluids. Follow these  instructions at home: Eating and drinking   Eat foods that have a lot of fiber, such as fresh fruits and vegetables, whole grains, and beans.  Limit foods that are high in fat, low in fiber, or overly processed, such as french fries, hamburgers, cookies, candies, and soda.  Drink enough fluid to keep your urine clear or pale yellow. General instructions  Exercise regularly or as told by your health care provider.  Go to the restroom when you have the urge to go. Do not hold it in.  Take over-the-counter and prescription medicines only as told by your health care provider. These include any fiber supplements.  Practice pelvic floor retraining exercises, such as deep breathing while relaxing the lower abdomen and pelvic floor relaxation during bowel movements.  Watch your condition for any changes.  Keep all follow-up visits as told by your health care provider. This is important. Contact a health care provider if:  You have pain that gets worse.  You have a fever.  You do not have a bowel movement after 4 days.  You vomit.  You are not hungry.  You lose weight.  You are bleeding from the anus.  You have thin, pencil-like stools. Get help right away if:  You have a fever and your symptoms suddenly get worse.  You leak stool or have blood in your stool.  Your abdomen is bloated.  You have severe pain in your abdomen.  You feel dizzy or you faint. This information is not intended to replace advice given to you by your health care provider. Make sure you discuss any questions you have with your health care provider. Document Released: 03/09/2004 Document Revised: 05/24/2017 Document Reviewed: 11/30/2015 Elsevier Patient Education  2020 [employer].

## 2019-06-02 LAB — COMPREHENSIVE METABOLIC PANEL
ALT: 14 IU/L (ref 0–32)
AST: 12 IU/L (ref 0–40)
Albumin/Globulin Ratio: 1.8 (ref 1.2–2.2)
Albumin: 4.3 g/dL (ref 3.8–4.8)
Alkaline Phosphatase: 94 IU/L (ref 39–117)
BUN/Creatinine Ratio: 12 (ref 9–23)
BUN: 10 mg/dL (ref 6–24)
Bilirubin Total: 0.5 mg/dL (ref 0.0–1.2)
CO2: 24 mmol/L (ref 20–29)
Calcium: 8.8 mg/dL (ref 8.7–10.2)
Chloride: 102 mmol/L (ref 96–106)
Creatinine, Ser: 0.86 mg/dL (ref 0.57–1.00)
GFR calc Af Amer: 97 mL/min/{1.73_m2} (ref >59)
GFR calc non Af Amer: 84 mL/min/{1.73_m2} (ref >59)
Globulin, Total: 2.4 g/dL (ref 1.5–4.5)
Glucose: 88 mg/dL (ref 65–99)
Potassium: 4.2 mmol/L (ref 3.5–5.2)
Sodium: 138 mmol/L (ref 134–144)
Total Protein: 6.7 g/dL (ref 6.0–8.5)

## 2019-06-02 LAB — CBC
Hematocrit: 40.7 % (ref 34.0–46.6)
Hemoglobin: 13.6 g/dL (ref 11.1–15.9)
MCH: 29.6 pg (ref 26.6–33.0)
MCHC: 33.4 g/dL (ref 31.5–35.7)
MCV: 89 fL (ref 79–97)
Platelets: 231 10*3/uL (ref 150–450)
RBC: 4.6 x10E6/uL (ref 3.77–5.28)
RDW: 13 % (ref 11.7–15.4)
WBC: 5.1 10*3/uL (ref 3.4–10.8)

## 2019-06-02 LAB — TSH: TSH: 1.6 u[IU]/mL (ref 0.450–4.500)

## 2019-06-02 LAB — LIPID PANEL
Chol/HDL Ratio: 4.2 ratio (ref 0.0–4.4)
Cholesterol, Total: 189 mg/dL (ref 100–199)
HDL: 45 mg/dL (ref >39)
LDL Chol Calc (NIH): 122 mg/dL — ABNORMAL HIGH (ref 0–99)
Triglycerides: 120 mg/dL (ref 0–149)
VLDL Cholesterol Cal: 22 mg/dL (ref 5–40)

## 2019-06-03 LAB — CYTOLOGY - PAP
Adequacy: ABSENT
Comment: NEGATIVE
Diagnosis: UNDETERMINED — AB
High risk HPV: NEGATIVE

## 2019-07-22 ENCOUNTER — Other Ambulatory Visit (HOSPITAL_COMMUNITY): Payer: Self-pay

## 2019-07-28 ENCOUNTER — Other Ambulatory Visit: Payer: Self-pay | Admitting: Obstetrics and Gynecology

## 2019-07-28 DIAGNOSIS — Z1231 Encounter for screening mammogram for malignant neoplasm of breast: Secondary | ICD-10-CM

## 2019-07-29 ENCOUNTER — Other Ambulatory Visit: Payer: Self-pay

## 2019-07-29 ENCOUNTER — Ambulatory Visit (INDEPENDENT_AMBULATORY_CARE_PROVIDER_SITE_OTHER): Payer: 59 | Admitting: Psychiatry

## 2019-07-29 ENCOUNTER — Ambulatory Visit
Admission: RE | Admit: 2019-07-29 | Discharge: 2019-07-29 | Disposition: A | Discharge status: Home / Self Care | Payer: Managed Care, Other (non HMO) | Source: Ambulatory Visit

## 2019-07-29 DIAGNOSIS — Z1231 Encounter for screening mammogram for malignant neoplasm of breast: Secondary | ICD-10-CM

## 2019-07-29 DIAGNOSIS — F900 Attention-deficit hyperactivity disorder, predominantly inattentive type: Secondary | ICD-10-CM | POA: Diagnosis not present

## 2019-07-29 MED ORDER — AMPHETAMINE SULFATE 5 MG PO TABS: 5.0000 mg | ORAL_TABLET | Freq: Two times a day (BID) | ORAL | 0 refills | Status: DC

## 2019-07-29 MED ORDER — ATOMOXETINE HCL 40 MG PO CAPS: ORAL_CAPSULE | ORAL | 0 refills | Status: DC

## 2019-07-29 NOTE — Progress Notes (Signed)
Psychiatric Initial Adult Assessment   Patient Identification: Caroline Hayes MRN:  [id number] Date of Evaluation:  07/29/2019 Referral Source: Dr Irven Shelling Chief Complaint:   Visit Diagnosis: Attention deficit disorder  History of Present Illness:    At this time the patient says that the Strattera has not had any benefit.  She got up to 80 mg and she has been taking it for 2 months.  At this time we will discontinue the Strattera and begin he on Evekeo 5 mg in the morning and 5 mg at noon.  I have asked her to discontinue the Strattera as has had no benefit but is no cause no side effects.  Without being on the Adderall she feels less hyped less energized has less physical complaints.  Therefore was the Adderall that was causing problems.  All the side effects from alcohol.  She continues to have some GI issues she is having a colonoscopy coming up soon.  At this time we will try her on his new stimulant and make contact with her again at her next visit in 7 weeks.   Associated Signs/Symptoms: Depression Symptoms:  difficulty concentrating, (Hypo) Manic Symptoms:   Anxiety Symptoms:   Psychotic Symptoms:   PTSD Symptoms:   Past Psychiatric History:   Previous Psychotropic Medications:   Substance Abuse History in the last 12 months:    Consequences of Substance Abuse:   Past Medical History:  Past Medical History:  Diagnosis Date  . ADHD 2020  . Anxiety   . Chlamydia 2000  . Depression   . Dyspareunia   . Family history of adverse reaction to anesthesia    mother with nausea and vomiting  . Fibroid   . GERD (gastroesophageal reflux disease)    occasional, mild  . Headache    due to bnirth control pills  . Heart murmur   . 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 MVP (mitral valve prolapse)   . Rubella immune 2010    Past Surgical History:  Procedure Laterality Date  . CYSTOSCOPY N/A 07/09/2017   Procedure: CYSTOSCOPY;  Surgeon: Megan Salon, MD;  Location: [facility];  Service: Gynecology;  Laterality: N/A;  . IUD REMOVAL N/A 07/09/2017   Procedure: INTRAUTERINE DEVICE (IUD) REMOVAL;  Surgeon: Megan Salon, MD;  Location: [facility];  Service: Gynecology;  Laterality: N/A;  . ROOT CANAL  07/2014  . TOTAL LAPAROSCOPIC HYSTERECTOMY WITH SALPINGECTOMY Bilateral 07/09/2017   Procedure: TOTAL LAPAROSCOPIC HYSTERECTOMY WITH SALPINGECTOMY;  Surgeon: Megan Salon, MD;  Location: [facility];  Service: Gynecology;  Laterality: Bilateral;  . WISDOM TOOTH EXTRACTION  1999    Family Psychiatric History:   Family History:  Family History  Problem Relation Age of Onset  . Hypertension Mother   . Anemia Mother   . Hypertension Maternal Grandmother   . Cancer Sister   . Heart attack Paternal Grandfather   . Breast cancer Maternal Aunt 31       x2 recurence age 3    Social History:   Social History   Socioeconomic History  . Marital status: Soil scientist    Spouse name: Not on file  . Number of children: Not on file  . Years of education: Not on file  . Highest education level: Not on file  Occupational History  . Not on file  Tobacco Use  . Smoking status: Never Smoker  . Smokeless tobacco: Never Used  Substance and Sexual Activity  .  Alcohol use: Yes    Comment: occ  . Drug use: No  . Sexual activity: Yes    Partners: Male    Birth control/protection: Surgical    Comment: Hyst  Other Topics Concern  . Not on file  Social History Narrative  . Not on file   Social Determinants of Health   Financial Resource Strain:   . Difficulty of Paying Living Expenses: Not on file  Food Insecurity:   . Worried About Charity fundraiser in the Last Year: Not on file  . Ran Out of Food in the Last Year: Not on file  Transportation Needs:   . Lack of Transportation (Medical): Not on file  . Lack of Transportation (Non-Medical): Not on file  Physical Activity:   . Days of Exercise per Week: Not on file  . Minutes of Exercise  per Session: Not on file  Stress:   . Feeling of Stress : Not on file  Social Connections:   . Frequency of Communication with Friends and Family: Not on file  . Frequency of Social Gatherings with Friends and Family: Not on file  . Attends Religious Services: Not on file  . Active Member of Clubs or Organizations: Not on file  . Attends Archivist Meetings: Not on file  . Marital Status: Not on file    Additional Social History:   Allergies:   Allergies  Allergen Reactions  . Barley Green Itching  . Eggs Or Egg-Derived Products Nausea Only  . Ginger Other (See Comments)    Nose itchy  . Imitrex [Sumatriptan Base] Nausea Only and Swelling  . Soy Allergy     Metabolic Disorder Labs: No results found for: HGBA1C, MPG Lab Results  Component Value Date   PROLACTIN 17.0 09/02/2013   PROLACTIN 7.6 03/20/2013   Lab Results  Component Value Date   CHOL 189 06/01/2019   TRIG 120 06/01/2019   HDL 45 06/01/2019   CHOLHDL 4.2 06/01/2019   LDLCALC 122 (H) 06/01/2019   LDLCALC 125 (H) 05/19/2018   Lab Results  Component Value Date   TSH 1.600 06/01/2019    Therapeutic Level Labs: No results found for: LITHIUM No results found for: CBMZ No results found for: VALPROATE  Current Medications: Current Outpatient Medications  Medication Sig Dispense Refill  . Amphetamine Sulfate (EVEKEO) 5 MG TABS Take 5 mg by mouth 2 (two) times daily. 60 tablet 0  . Amphetamine Sulfate (EVEKEO) 5 MG TABS Take 5 mg by mouth 2 (two) times daily. 1 qam  For  1 week then 1 qam 1  qnoon 60 tablet 0  . amphetamine-dextroamphetamine (ADDERALL XR) 5 MG 24 hr capsule 2 qam on Monday 1 qam on all other days fill after 04/26/2019 35 capsule 0  . atomoxetine (STRATTERA) 40 MG capsule 1 qam for 1 week the 1 qam and 1 at noon therafter 60 capsule 0  . Cholecalciferol (VITAMIN D3) 125 MCG (5000 UT) CAPS Take 2 capsules by mouth daily.    Marland Kitchen DYMISTA 137-50 MCG/ACT SUSP Place 1 spray into the nose  daily.  0  . fluticasone (FLONASE) 50 MCG/ACT nasal spray Place 1 spray into both nostrils daily as needed (for allergies.).     Marland Kitchen loratadine (CLARITIN) 10 MG tablet Take 10 mg by mouth daily as needed for allergies.    . montelukast (SINGULAIR) 10 MG tablet Take 1 tablet by mouth as needed.  0  . naproxen sodium (ALEVE) 220 MG tablet Take 220 mg  by mouth 2 (two) times daily as needed.    Marland Kitchen omeprazole (PRILOSEC OTC) 20 MG tablet Take 20 mg by mouth daily as needed (for acid reflux/indigestion.).     No current facility-administered medications for this visit.    Musculoskeletal: Strength & Muscle Tone: within normal limits Gait & Station: normal Patient leans: N/A  Psychiatric Specialty Exam: ROS  Last menstrual period 03/18/2017.There is no height or weight on file to calculate BMI.  General Appearance: Casual  Eye Contact:  Good  Speech:  Clear and Coherent  Volume:  Normal  Mood:  Negative  Affect:  Congruent  Thought Process:  Coherent  Orientation:  Full (Time, Place, and Person)  Thought Content:  WDL  Suicidal Thoughts:  No  Homicidal Thoughts:  No  Memory:  NA  Judgement:  Good  Insight:  Good  Psychomotor Activity:  Normal  Concentration:  Attention Span: Fair  Recall:  Good  Fund of Knowledge:Good  Language: Good  Akathisia:  No  Handed:  Right  AIMS (if indicated):  not done  Assets:  Desire for Improvement  ADL's:  Intact  Cognition: Normal  Sleep:  Good   Screenings:    At this time the patient is #1 problem is attention deficit disorder.  We will discontinue her Strattera and begin her on Evekeo 5 mg in the morning and then 5 mg at noon.  She will return to see me in 7 weeks. Jerral Ralph, MD 2/3/20213:47 PM

## 2019-07-29 NOTE — Addendum Note (Signed)
Addended by: Norma Fredrickson on: 07/29/2019 05:11 PM   Modules accepted: Orders

## 2019-07-31 ENCOUNTER — Other Ambulatory Visit: Payer: Self-pay | Admitting: Obstetrics and Gynecology

## 2019-07-31 DIAGNOSIS — R928 Other abnormal and inconclusive findings on diagnostic imaging of breast: Secondary | ICD-10-CM

## 2019-08-06 ENCOUNTER — Other Ambulatory Visit: Payer: Self-pay | Admitting: Obstetrics and Gynecology

## 2019-08-06 DIAGNOSIS — R928 Other abnormal and inconclusive findings on diagnostic imaging of breast: Secondary | ICD-10-CM

## 2019-08-12 ENCOUNTER — Ambulatory Visit
Admission: RE | Admit: 2019-08-12 | Discharge: 2019-08-12 | Disposition: A | Discharge status: Home / Self Care | Payer: Managed Care, Other (non HMO) | Source: Ambulatory Visit | Attending: Obstetrics and Gynecology | Admitting: Obstetrics and Gynecology

## 2019-08-12 ENCOUNTER — Other Ambulatory Visit: Payer: Self-pay

## 2019-08-12 DIAGNOSIS — R928 Other abnormal and inconclusive findings on diagnostic imaging of breast: Secondary | ICD-10-CM

## 2019-08-26 ENCOUNTER — Ambulatory Visit (HOSPITAL_COMMUNITY): Payer: 59 | Admitting: Psychiatry

## 2019-09-11 ENCOUNTER — Encounter: Payer: Self-pay | Admitting: Certified Nurse Midwife

## 2019-09-16 ENCOUNTER — Ambulatory Visit (INDEPENDENT_AMBULATORY_CARE_PROVIDER_SITE_OTHER): Payer: 59 | Admitting: Psychiatry

## 2019-09-16 ENCOUNTER — Other Ambulatory Visit: Payer: Self-pay

## 2019-09-16 DIAGNOSIS — F900 Attention-deficit hyperactivity disorder, predominantly inattentive type: Secondary | ICD-10-CM

## 2019-09-16 MED ORDER — AMPHETAMINE SULFATE 5 MG PO TABS: 5.0000 mg | ORAL_TABLET | Freq: Two times a day (BID) | ORAL | 0 refills | Status: DC

## 2019-09-16 NOTE — Progress Notes (Signed)
Psychiatric Initial Adult Assessment   Patient Identification: Caroline Hayes MRN:  [id number] Date of Evaluation:  09/16/2019 Referral Source: Dr Irven Shelling Chief Complaint:   Visit Diagnosis: Attention deficit disorder  History of Present Illness:    Today the patient shared with me that she thinks the Evokia is helpful.  Some making her is hyper and restless as Adderall.  It apparently is working better than Strattera.  The patient however is not taking it appropriately.  She is taking 10 mg in the morning.  I have asked her to take 5 mg in the morning and 5 at noon.  She is only been on it for about 12 days.  At this time we will ask her to take it as prescribed 1 in the morning 1 at noon.  Patient works at home on a computer.  She has a lot of GI complications.  She says is causing some degree of constipation.  Is been on multiple different medications for constipation.  Not clear which ones of work.  Patient recently had a colonoscopy that showed just hemorrhoids.  At this time I think she needs more time taking the medicine.  Patient is not depressed.  She denies the use of alcohol or drugs.   Associated Signs/Symptoms: Depression Symptoms:  difficulty concentrating, (Hypo) Manic Symptoms:   Anxiety Symptoms:   Psychotic Symptoms:   PTSD Symptoms:   Past Psychiatric History:   Previous Psychotropic Medications:   Substance Abuse History in the last 12 months:    Consequences of Substance Abuse:   Past Medical History:  Past Medical History:  Diagnosis Date  . ADHD 2020  . Anxiety   . Chlamydia 2000  . Depression   . Dyspareunia   . Family history of adverse reaction to anesthesia    mother with nausea and vomiting  . Fibroid   . GERD (gastroesophageal reflux disease)    occasional, mild  . Headache    due to bnirth control pills  . Heart murmur   . 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 MVP (mitral valve prolapse)   . Rubella immune  2010    Past Surgical History:  Procedure Laterality Date  . CYSTOSCOPY N/A 07/09/2017   Procedure: CYSTOSCOPY;  Surgeon: Megan Salon, MD;  Location: [facility];  Service: Gynecology;  Laterality: N/A;  . IUD REMOVAL N/A 07/09/2017   Procedure: INTRAUTERINE DEVICE (IUD) REMOVAL;  Surgeon: Megan Salon, MD;  Location: [facility];  Service: Gynecology;  Laterality: N/A;  . ROOT CANAL  07/2014  . TOTAL LAPAROSCOPIC HYSTERECTOMY WITH SALPINGECTOMY Bilateral 07/09/2017   Procedure: TOTAL LAPAROSCOPIC HYSTERECTOMY WITH SALPINGECTOMY;  Surgeon: Megan Salon, MD;  Location: [facility];  Service: Gynecology;  Laterality: Bilateral;  . WISDOM TOOTH EXTRACTION  1999    Family Psychiatric History:   Family History:  Family History  Problem Relation Age of Onset  . Hypertension Mother   . Anemia Mother   . Hypertension Maternal Grandmother   . Cancer Sister   . Heart attack Paternal Grandfather   . Breast cancer Maternal Aunt 73       x2 recurence age 64    Social History:   Social History   Socioeconomic History  . Marital status: Soil scientist    Spouse name: Not on file  . Number of children: Not on file  . Years of education: Not on file  . Highest education level: Not on file  Occupational  History  . Not on file  Tobacco Use  . Smoking status: Never Smoker  . Smokeless tobacco: Never Used  Substance and Sexual Activity  . Alcohol use: Yes    Comment: occ  . Drug use: No  . Sexual activity: Yes    Partners: Male    Birth control/protection: Surgical    Comment: Hyst  Other Topics Concern  . Not on file  Social History Narrative  . Not on file   Social Determinants of Health   Financial Resource Strain:   . Difficulty of Paying Living Expenses:   Food Insecurity:   . Worried About Charity fundraiser in the Last Year:   . Arboriculturist in the Last Year:   Transportation Needs:   . Film/video editor (Medical):   Marland Kitchen Lack of Transportation (Non-Medical):   Physical  Activity:   . Days of Exercise per Week:   . Minutes of Exercise per Session:   Stress:   . Feeling of Stress :   Social Connections:   . Frequency of Communication with Friends and Family:   . Frequency of Social Gatherings with Friends and Family:   . Attends Religious Services:   . Active Member of Clubs or Organizations:   . Attends Archivist Meetings:   Marland Kitchen Marital Status:     Additional Social History:   Allergies:   Allergies  Allergen Reactions  . Barley Green Itching  . Eggs Or Egg-Derived Products Nausea Only  . Ginger Other (See Comments)    Nose itchy  . Imitrex [Sumatriptan Base] Nausea Only and Swelling  . Soy Allergy     Metabolic Disorder Labs: No results found for: HGBA1C, MPG Lab Results  Component Value Date   PROLACTIN 17.0 09/02/2013   PROLACTIN 7.6 03/20/2013   Lab Results  Component Value Date   CHOL 189 06/01/2019   TRIG 120 06/01/2019   HDL 45 06/01/2019   CHOLHDL 4.2 06/01/2019   LDLCALC 122 (H) 06/01/2019   LDLCALC 125 (H) 05/19/2018   Lab Results  Component Value Date   TSH 1.600 06/01/2019    Therapeutic Level Labs: No results found for: LITHIUM No results found for: CBMZ No results found for: VALPROATE  Current Medications: Current Outpatient Medications  Medication Sig Dispense Refill  . Amphetamine Sulfate (EVEKEO) 5 MG TABS Take 5 mg by mouth 2 (two) times daily. 60 tablet 0  . [START ON 10/17/2019] Amphetamine Sulfate (EVEKEO) 5 MG TABS Take 5 mg by mouth 2 (two) times daily. 1 qam  For  1 week then 1 qam 1  qnoon 60 tablet 0  . atomoxetine (STRATTERA) 40 MG capsule 1 qam for 1 week the 1 qam and 1 at noon therafter 60 capsule 0  . Cholecalciferol (VITAMIN D3) 125 MCG (5000 UT) CAPS Take 2 capsules by mouth daily.    Marland Kitchen DYMISTA 137-50 MCG/ACT SUSP Place 1 spray into the nose daily.  0  . fluticasone (FLONASE) 50 MCG/ACT nasal spray Place 1 spray into both nostrils daily as needed (for allergies.).     Marland Kitchen loratadine  (CLARITIN) 10 MG tablet Take 10 mg by mouth daily as needed for allergies.    . montelukast (SINGULAIR) 10 MG tablet Take 1 tablet by mouth as needed.  0  . naproxen sodium (ALEVE) 220 MG tablet Take 220 mg by mouth 2 (two) times daily as needed.    Marland Kitchen omeprazole (PRILOSEC OTC) 20 MG tablet Take 20 mg by mouth  daily as needed (for acid reflux/indigestion.).     No current facility-administered medications for this visit.    Musculoskeletal: Strength & Muscle Tone: within normal limits Gait & Station: normal Patient leans: N/A  Psychiatric Specialty Exam: ROS  Last menstrual period 03/18/2017.There is no height or weight on file to calculate BMI.  General Appearance: Casual  Eye Contact:  Good  Speech:  Clear and Coherent  Volume:  Normal  Mood:  Negative  Affect:  Congruent  Thought Process:  Coherent  Orientation:  Full (Time, Place, and Person)  Thought Content:  WDL  Suicidal Thoughts:  No  Homicidal Thoughts:  No  Memory:  NA  Judgement:  Good  Insight:  Good  Psychomotor Activity:  Normal  Concentration:  Attention Span: Fair  Recall:  Good  Fund of Knowledge:Good  Language: Good  Akathisia:  No  Handed:  Right  AIMS (if indicated):  not done  Assets:  Desire for Improvement  ADL's:  Intact  Cognition: Normal  Sleep:  Good   Screenings:   At this time the patient is #1 problem is an attention deficit disorder.  She will continue taking Evokia.  This medication can be taken at much higher doses but I think this patient is very sensitive to medications.  She is only been on this dose for about 10 days.  At this time we will continue it at 5 mg in the morning and 5 mg at noon.  We will reevaluate her in 2 months.  Patient is agreed to this plan. Jerral Ralph, MD 3/24/20211:50 PM

## 2019-11-18 ENCOUNTER — Ambulatory Visit (INDEPENDENT_AMBULATORY_CARE_PROVIDER_SITE_OTHER): Payer: 59 | Admitting: Psychiatry

## 2019-11-18 ENCOUNTER — Other Ambulatory Visit: Payer: Self-pay

## 2019-11-18 DIAGNOSIS — F900 Attention-deficit hyperactivity disorder, predominantly inattentive type: Secondary | ICD-10-CM | POA: Diagnosis not present

## 2019-11-18 MED ORDER — AMPHETAMINE SULFATE 5 MG PO TABS: 5.0000 mg | ORAL_TABLET | Freq: Two times a day (BID) | ORAL | 0 refills | Status: DC

## 2019-11-18 NOTE — Progress Notes (Signed)
Psychiatric Initial Adult Assessment   Patient Identification: Caroline Hayes MRN:  [id number] Date of Evaluation:  11/18/2019 Referral Source: Dr Irven Shelling Chief Complaint:   Visit Diagnosis: Attention deficit disorder  History of Present Illness:     Today the patient is doing fairly well.  He is now taking her stimulant 5 mg in the morning and 5 mg at noon.  Patient says that she is more affected.  She is focused better organized better and does not feel as hyper.  She describes a mild degree of anxiety.  She is somewhat vague and inconsistent.  She still having some GI symptoms and sees a gastroenterologist.  She recently had a colonoscopy.  The patient denies chest pain shortness of breath or any psychotic symptomatology.  She denies use of drugs or alcohol.  She continues to work more effectively taking Evokia.  The patient denies being depressed.  She still enjoys life.  Overall she will acknowledged that the medicine is better than the Adderall and clearly is better than no medication.  At this time I chosen not to adjust the dose.  The patient is okay with that.  She is handling the pandemic fairly well.  She is functioning well. Associated Signs/Symptoms: Depression Symptoms:  difficulty concentrating, (Hypo) Manic Symptoms:   Anxiety Symptoms:   Psychotic Symptoms:   PTSD Symptoms:   Past Psychiatric History:   Previous Psychotropic Medications:   Substance Abuse History in the last 12 months:    Consequences of Substance Abuse:   Past Medical History:  Past Medical History:  Diagnosis Date  . ADHD 2020  . Anxiety   . Chlamydia 2000  . Depression   . Dyspareunia   . Family history of adverse reaction to anesthesia    mother with nausea and vomiting  . Fibroid   . GERD (gastroesophageal reflux disease)    occasional, mild  . Headache    due to bnirth control pills  . Heart murmur   . 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 MVP (mitral  valve prolapse)   . Rubella immune 2010    Past Surgical History:  Procedure Laterality Date  . CYSTOSCOPY N/A 07/09/2017   Procedure: CYSTOSCOPY;  Surgeon: Megan Salon, MD;  Location: [facility];  Service: Gynecology;  Laterality: N/A;  . IUD REMOVAL N/A 07/09/2017   Procedure: INTRAUTERINE DEVICE (IUD) REMOVAL;  Surgeon: Megan Salon, MD;  Location: [facility];  Service: Gynecology;  Laterality: N/A;  . ROOT CANAL  07/2014  . TOTAL LAPAROSCOPIC HYSTERECTOMY WITH SALPINGECTOMY Bilateral 07/09/2017   Procedure: TOTAL LAPAROSCOPIC HYSTERECTOMY WITH SALPINGECTOMY;  Surgeon: Megan Salon, MD;  Location: [facility];  Service: Gynecology;  Laterality: Bilateral;  . WISDOM TOOTH EXTRACTION  1999    Family Psychiatric History:   Family History:  Family History  Problem Relation Age of Onset  . Hypertension Mother   . Anemia Mother   . Hypertension Maternal Grandmother   . Cancer Sister   . Heart attack Paternal Grandfather   . Breast cancer Maternal Aunt 8       x2 recurence age 13    Social History:   Social History   Socioeconomic History  . Marital status: Soil scientist    Spouse name: Not on file  . Number of children: Not on file  . Years of education: Not on file  . Highest education level: Not on file  Occupational History  . Not on file  Tobacco Use  . Smoking status: Never Smoker  . Smokeless tobacco: Never Used  Substance and Sexual Activity  . Alcohol use: Yes    Comment: occ  . Drug use: No  . Sexual activity: Yes    Partners: Male    Birth control/protection: Surgical    Comment: Hyst  Other Topics Concern  . Not on file  Social History Narrative  . Not on file   Social Determinants of Health   Financial Resource Strain:   . Difficulty of Paying Living Expenses:   Food Insecurity:   . Worried About Charity fundraiser in the Last Year:   . Arboriculturist in the Last Year:   Transportation Needs:   . Film/video editor (Medical):   Marland Kitchen Lack of  Transportation (Non-Medical):   Physical Activity:   . Days of Exercise per Week:   . Minutes of Exercise per Session:   Stress:   . Feeling of Stress :   Social Connections:   . Frequency of Communication with Friends and Family:   . Frequency of Social Gatherings with Friends and Family:   . Attends Religious Services:   . Active Member of Clubs or Organizations:   . Attends Archivist Meetings:   Marland Kitchen Marital Status:     Additional Social History:   Allergies:   Allergies  Allergen Reactions  . Barley Green Itching  . Eggs Or Egg-Derived Products Nausea Only  . Ginger Other (See Comments)    Nose itchy  . Imitrex [Sumatriptan Base] Nausea Only and Swelling  . Soy Allergy     Metabolic Disorder Labs: No results found for: HGBA1C, MPG Lab Results  Component Value Date   PROLACTIN 17.0 09/02/2013   PROLACTIN 7.6 03/20/2013   Lab Results  Component Value Date   CHOL 189 06/01/2019   TRIG 120 06/01/2019   HDL 45 06/01/2019   CHOLHDL 4.2 06/01/2019   LDLCALC 122 (H) 06/01/2019   LDLCALC 125 (H) 05/19/2018   Lab Results  Component Value Date   TSH 1.600 06/01/2019    Therapeutic Level Labs: No results found for: LITHIUM No results found for: CBMZ No results found for: VALPROATE  Current Medications: Current Outpatient Medications  Medication Sig Dispense Refill  . [START ON 12/19/2019] Amphetamine Sulfate (EVEKEO) 5 MG TABS Take 5 mg by mouth 2 (two) times daily. 1 qam  For  1 week then 1 qam 1  qnoon 60 tablet 0  . Cholecalciferol (VITAMIN D3) 125 MCG (5000 UT) CAPS Take 2 capsules by mouth daily.    Marland Kitchen DYMISTA 137-50 MCG/ACT SUSP Place 1 spray into the nose daily.  0  . fluticasone (FLONASE) 50 MCG/ACT nasal spray Place 1 spray into both nostrils daily as needed (for allergies.).     Marland Kitchen loratadine (CLARITIN) 10 MG tablet Take 10 mg by mouth daily as needed for allergies.    . montelukast (SINGULAIR) 10 MG tablet Take 1 tablet by mouth as needed.  0  .  naproxen sodium (ALEVE) 220 MG tablet Take 220 mg by mouth 2 (two) times daily as needed.    Marland Kitchen omeprazole (PRILOSEC OTC) 20 MG tablet Take 20 mg by mouth daily as needed (for acid reflux/indigestion.).     No current facility-administered medications for this visit.    Musculoskeletal: Strength & Muscle Tone: within normal limits Gait & Station: normal Patient leans: N/A  Psychiatric Specialty Exam: ROS  Last menstrual period 03/18/2017.There is no height or weight  on file to calculate BMI.  General Appearance: Casual  Eye Contact:  Good  Speech:  Clear and Coherent  Volume:  Normal  Mood:  Negative  Affect:  Congruent  Thought Process:  Coherent  Orientation:  Full (Time, Place, and Person)  Thought Content:  WDL  Suicidal Thoughts:  No  Homicidal Thoughts:  No  Memory:  NA  Judgement:  Good  Insight:  Good  Psychomotor Activity:  Normal  Concentration:  Attention Span: Fair  Recall:  Good  Fund of Knowledge:Good  Language: Good  Akathisia:  No  Handed:  Right  AIMS (if indicated):  not done  Assets:  Desire for Improvement  ADL's:  Intact  Cognition: Normal  Sleep:  Good   Screenings:   This patient's problem is that of attention deficit disorder.  Should continue taking 5 mg of Evokia in the morning and at noon.  Patient drinks no alcohol uses no drugs.  She was given up prescriptions to last her for 3 months.  She was then instructed that close to the end of the prescription to call in get another refill.  At this time the patient will be given a return appointment to see me in 5 months. Jerral Ralph, MD 5/26/20212:30 PM

## 2020-02-19 IMAGING — DX DG FINGER RING 2+V*R*
3 series · 3 of 3 positions shown · non-contrast
Comparison: None.

CLINICAL DATA: Foreign body under nail for 1 week.

EXAM:
RIGHT RING FINGER 2+V

[finger ap]
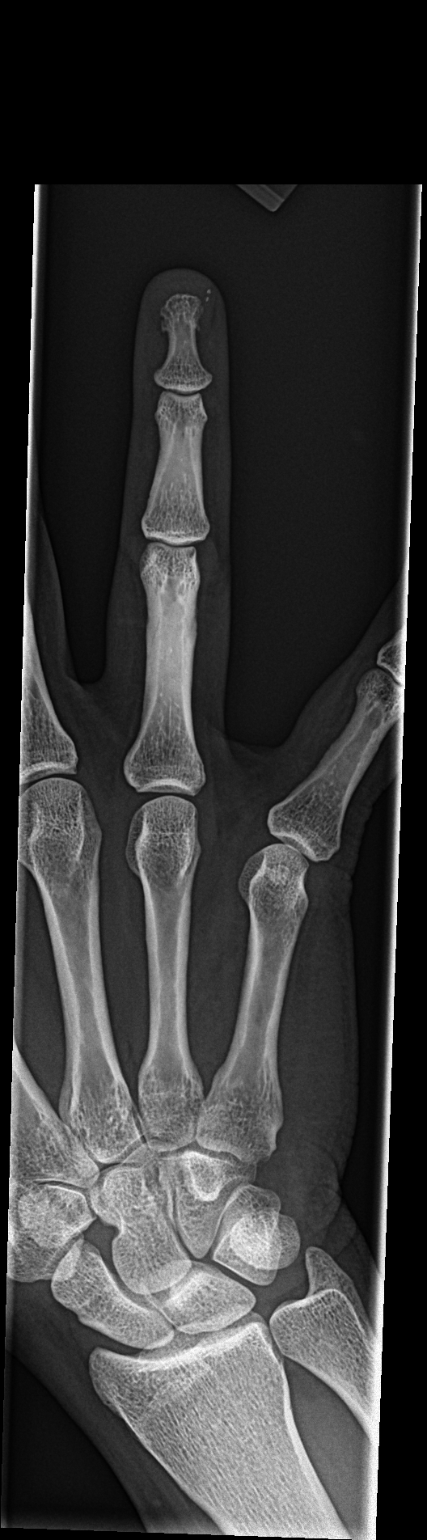

[finger obl]
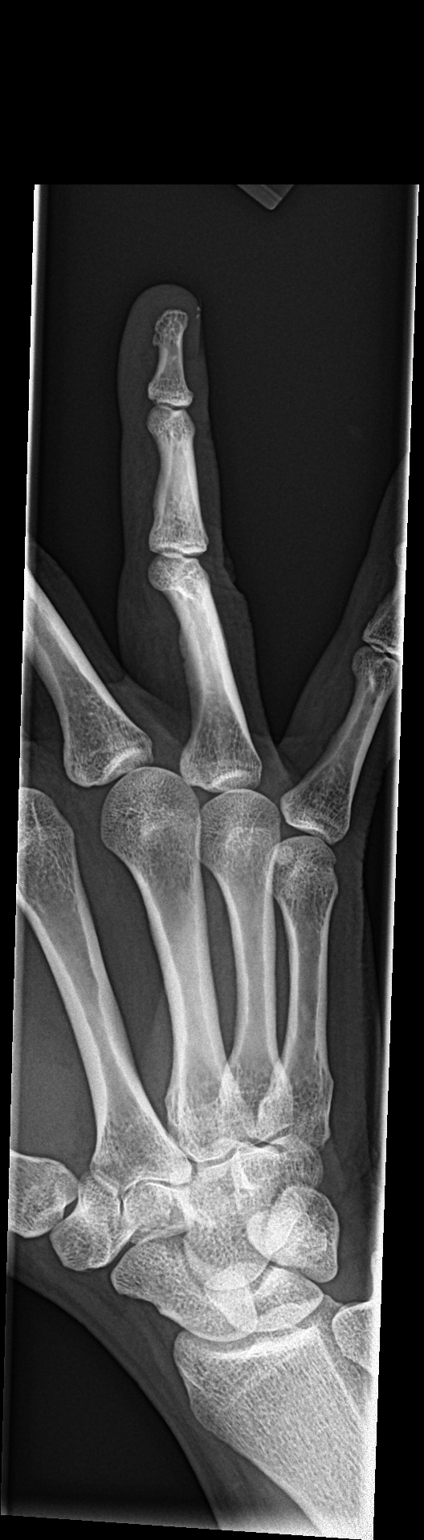

[finger lat]
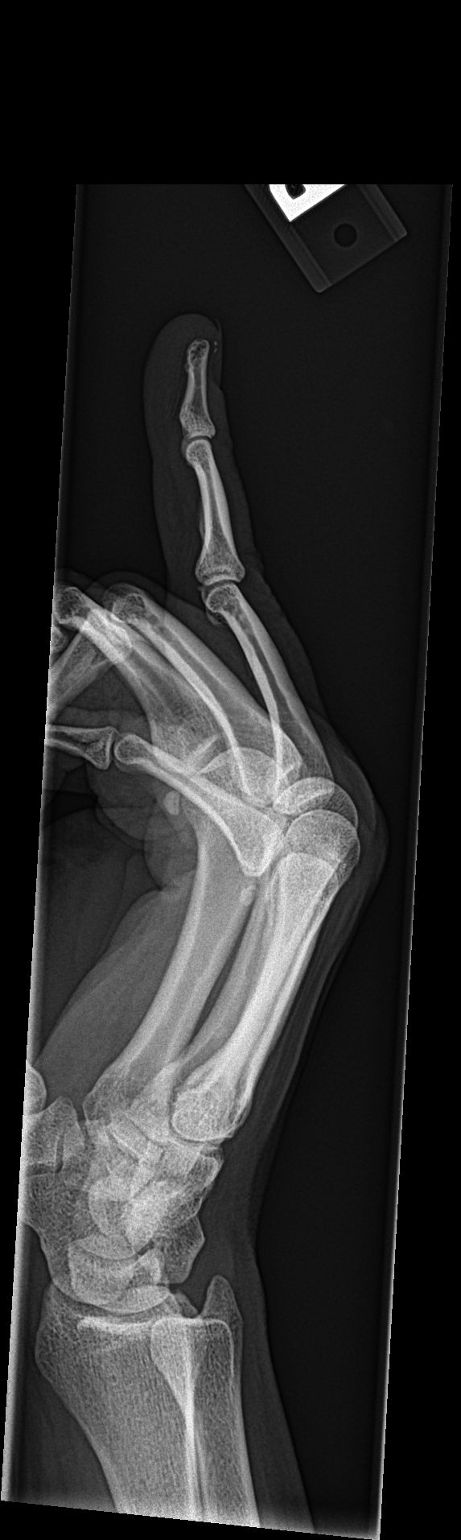

[3 of 3 positions shown; findings below may reference images not displayed]

FINDINGS: There are two tiny radiopaque densities underneath the nail, but
superficial to the bone, along the ulnar side of finger. No osseous
findings are evident.
IMPRESSION: Radiopaque foreign bodies underneath the nail.  No osseous findings.

## 2020-02-24 ENCOUNTER — Other Ambulatory Visit: Payer: Self-pay

## 2020-02-24 ENCOUNTER — Ambulatory Visit (INDEPENDENT_AMBULATORY_CARE_PROVIDER_SITE_OTHER): Payer: 59 | Admitting: Adult Health

## 2020-02-24 ENCOUNTER — Encounter: Payer: Self-pay | Admitting: Adult Health

## 2020-02-24 VITALS — BP 139/93 | HR 49 | Ht 69.0 in | Wt 180.0 lb

## 2020-02-24 DIAGNOSIS — F900 Attention-deficit hyperactivity disorder, predominantly inattentive type: Secondary | ICD-10-CM | POA: Diagnosis not present

## 2020-02-24 MED ORDER — METHYLPHENIDATE HCL 10 MG PO TABS: 10.0000 mg | ORAL_TABLET | Freq: Two times a day (BID) | ORAL | 0 refills | Status: DC

## 2020-02-24 NOTE — Progress Notes (Signed)
[facility] MD/PA/NP Initial Note  02/24/2020 12:53 PM [place]  MRN:  [id number]  Chief Complaint:   HPI:   Describes mood today as "ok". Pleasant. Mood symptoms - denies depression, anxiety, and irritability. Some worry and rumination. Can be "messy". Her goal of getting diagnosed with ADHD was to be able to do better in the work setting. Stating "it's frustrating and holding me back". Was diagnosed in March of 2020 and was seen by Dr Casimiro Needle. Has been trialed on Adderall and Stratera . Did not tolerate Adderall. Has done a lot of research on ADHD. Stable interest and motivation. Taking medications as prescribed.  Energy levels stable. Active, has a regular exercise routine.  Enjoys some usual interests and activities. Lives with partner [name] of 20 years. No children. No pets. Family in [place].  Appetite adequate. Weight stable - 180 pounds. Sleeps well most nights. Averages 8 hours. Focus and concentration difficulties - diagnosed with ADHD March 2020 - Irven Shelling. Tested because she wanted to do better with her job. Completing tasks. Managing aspects of household. 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  Denies SI or HI.  Denies AH or VH. Therapist - Laural Benes - weekly.  Previous medication trials: Adderall, [facility]  Visit Diagnosis:    ICD-10-CM   1. Attention deficit hyperactivity disorder (ADHD), predominantly inattentive type  F90.0 methylphenidate (RITALIN) 10 MG tablet    Past Psychiatric History: Denies psychiatric hospitalization.  Past Medical History:  Past Medical History:  Diagnosis Date  . ADHD 2020  . Anxiety   . Chlamydia 2000  . Depression   . Dyspareunia   . Family history of adverse reaction to anesthesia    mother with nausea and vomiting  . Fibroid   . GERD (gastroesophageal reflux disease)    occasional, mild  . Headache    due to bnirth control pills  . Heart murmur   . HPV in female 2003   CIN II with  negative Colpo Biopsy  . MVP (mitral valve prolapse)   . Rubella immune 2010    Past Surgical History:  Procedure Laterality Date  . CYSTOSCOPY N/A 07/09/2017   Procedure: CYSTOSCOPY;  Surgeon: Megan Salon, MD;  Location: [facility];  Service: Gynecology;  Laterality: N/A;  . IUD REMOVAL N/A 07/09/2017   Procedure: INTRAUTERINE DEVICE (IUD) REMOVAL;  Surgeon: Megan Salon, MD;  Location: [facility];  Service: Gynecology;  Laterality: N/A;  . ROOT CANAL  07/2014  . TOTAL LAPAROSCOPIC HYSTERECTOMY WITH SALPINGECTOMY Bilateral 07/09/2017   Procedure: TOTAL LAPAROSCOPIC HYSTERECTOMY WITH SALPINGECTOMY;  Surgeon: Megan Salon, MD;  Location: [facility];  Service: Gynecology;  Laterality: Bilateral;  . [place] EXTRACTION  1999    Family Psychiatric History: Mother with "on and off" depression.  Family History:  Family History  Problem Relation Age of Onset  . Hypertension Mother   . Anemia Mother   . Hypertension Maternal Grandmother   . Cancer Sister   . Heart attack Paternal Grandfather   . Breast cancer Maternal Aunt 32       x2 recurence age 42    Social History:  Social History   Socioeconomic History  . Marital status: Soil scientist    Spouse name: Not on file  . Number of children: Not on file  . Years of education: Not on file  . Highest education level: Not on file  Occupational History  . Not on file  Tobacco Use  .  Smoking status: Never Smoker  . Smokeless tobacco: Never Used  Vaping Use  . Vaping Use: Never used  Substance and Sexual Activity  . Alcohol use: Yes    Comment: occ  . Drug use: No  . Sexual activity: Yes    Partners: Male    Birth control/protection: Surgical    Comment: Hyst  Other Topics Concern  . Not on file  Social History Narrative  . Not on file   Social Determinants of Health   Financial Resource Strain:   . Difficulty of Paying Living Expenses: Not on file  Food Insecurity:   . Worried About Charity fundraiser in the Last  Year: Not on file  . Ran Out of Food in the Last Year: Not on file  Transportation Needs:   . Lack of Transportation (Medical): Not on file  . Lack of Transportation (Non-Medical): Not on file  Physical Activity:   . Days of Exercise per Week: Not on file  . Minutes of Exercise per Session: Not on file  Stress:   . Feeling of Stress : Not on file  Social Connections:   . Frequency of Communication with Friends and Family: Not on file  . Frequency of Social Gatherings with Friends and Family: Not on file  . Attends Religious Services: Not on file  . Active Member of Clubs or Organizations: Not on file  . Attends Archivist Meetings: Not on file  . Marital Status: Not on file    Allergies:  Allergies  Allergen Reactions  . Barley Green Itching  . Eggs Or Egg-Derived Products Nausea Only  . Ginger Other (See Comments)    Nose itchy  . Imitrex [Sumatriptan Base] Nausea Only and Swelling  . Soy Allergy     Metabolic Disorder Labs: No results found for: HGBA1C, MPG Lab Results  Component Value Date   PROLACTIN 17.0 09/02/2013   PROLACTIN 7.6 03/20/2013   Lab Results  Component Value Date   CHOL 189 06/01/2019   TRIG 120 06/01/2019   HDL 45 06/01/2019   CHOLHDL 4.2 06/01/2019   LDLCALC 122 (H) 06/01/2019   LDLCALC 125 (H) 05/19/2018   Lab Results  Component Value Date   TSH 1.600 06/01/2019   TSH 1.690 05/19/2018    Therapeutic Level Labs: No results found for: LITHIUM No results found for: VALPROATE No components found for:  CBMZ  Current Medications: Current Outpatient Medications  Medication Sig Dispense Refill  . Amphetamine Sulfate (EVEKEO) 5 MG TABS Take 5 mg by mouth 2 (two) times daily. 1 qam  For  1 week then 1 qam 1  qnoon 60 tablet 0  . Cholecalciferol (VITAMIN D3) 125 MCG (5000 UT) CAPS Take 2 capsules by mouth daily.    Marland Kitchen DYMISTA 137-50 MCG/ACT SUSP Place 1 spray into the nose daily.  0  . fluticasone (FLONASE) 50 MCG/ACT nasal spray  Place 1 spray into both nostrils daily as needed (for allergies.).     Marland Kitchen loratadine (CLARITIN) 10 MG tablet Take 10 mg by mouth daily as needed for allergies.    . methylphenidate (RITALIN) 10 MG tablet Take 1 tablet (10 mg total) by mouth 2 (two) times daily. 60 tablet 0  . montelukast (SINGULAIR) 10 MG tablet Take 1 tablet by mouth as needed.  0  . naproxen sodium (ALEVE) 220 MG tablet Take 220 mg by mouth 2 (two) times daily as needed.    Marland Kitchen omeprazole (PRILOSEC OTC) 20 MG tablet Take 20 mg  by mouth daily as needed (for acid reflux/indigestion.).     No current facility-administered medications for this visit.    Medication Side Effects: none  Orders placed this visit:  No orders of the defined types were placed in this encounter.   Psychiatric Specialty Exam:  Review of Systems  Musculoskeletal: Negative for gait problem.  Neurological: Negative for tremors.  Psychiatric/Behavioral:       Please refer to HPI    Blood pressure (!) 139/93, pulse (!) 49, height 5\' 9"  (1.753 m), weight 180 lb (81.6 kg), last menstrual period 03/18/2017.Body mass index is 26.58 kg/m.  General Appearance: Casual, Neat and Well Groomed  Eye Contact:  Good  Speech:  Clear and Coherent and Normal Rate  Volume:  Normal  Mood:  Euthymic  Affect:  Appropriate and Congruent  Thought Process:  Coherent and Descriptions of Associations: Intact  Orientation:  Full (Time, Place, and Person)  Thought Content: Logical   Suicidal Thoughts:  No  Homicidal Thoughts:  No  Memory:  WNL  Judgement:  Good  Insight:  Good  Psychomotor Activity:  Normal  Concentration:  Concentration: Good  Recall:  Good  Fund of Knowledge: Good  Language: Good  Assets:  Communication Skills Desire for Improvement Financial Resources/Insurance Housing Intimacy Leisure Time Physical Health Resilience Social Support Talents/Skills Transportation Vocational/Educational  ADL's:  Intact  Cognition: WNL  Prognosis:  Good    Screenings: None  Receiving Psychotherapy: Yes   Treatment Plan/Recommendations:   Plan:  PDMP reviewed  1. Add Ritalin 10mg  BID  RTC 4 weeks  Patient advised to contact office with any questions, adverse effects, or acute worsening in signs and symptoms.  Discussed potential benefits, risks, and side effects of stimulants with patient to include increased heart rate, palpitations, insomnia, increased anxiety, increased irritability, or decreased appetite.  Instructed patient to contact office if experiencing any significant tolerability issues.  Time spent with patient was 60 minutes.   Greater than 50% of face to face time with patient was spent on counseling and coordination of care. We discussed ADHD medications, supplementation, ADDitutde website and dietary choices.     Aloha Gell, NP

## 2020-03-23 ENCOUNTER — Encounter: Payer: Self-pay | Admitting: Adult Health

## 2020-03-23 ENCOUNTER — Ambulatory Visit (INDEPENDENT_AMBULATORY_CARE_PROVIDER_SITE_OTHER): Payer: 59 | Admitting: Adult Health

## 2020-03-23 ENCOUNTER — Other Ambulatory Visit: Payer: Self-pay

## 2020-03-23 DIAGNOSIS — F900 Attention-deficit hyperactivity disorder, predominantly inattentive type: Secondary | ICD-10-CM

## 2020-03-23 MED ORDER — METHYLPHENIDATE HCL 10 MG PO TABS: 10.0000 mg | ORAL_TABLET | Freq: Two times a day (BID) | ORAL | 0 refills | Status: DC

## 2020-03-23 NOTE — Progress Notes (Signed)
[place] [id number] [birth year] 44 y.o.  Subjective:   Patient ID:  Caroline Hayes is a 44 y.o. (DOB [birth year]) female.  Chief Complaint: No chief complaint on file.   HPI Leighton Taria Castrillo presents to the office today for follow-up of ADHD.  Describes mood today as "ok". Pleasant. Mood symptoms - denies depression, anxiety, and irritability. Denies mood swings. Stating "I am tolerating the Ritalin". Denies "brain fog" or "patchy wonderment". Stating "I feel like it's helping me". Some heightened attention in her body. Stable interest and motivation. Taking medications as prescribed.  Energy levels stable. Active, has a regular exercise routine.  Enjoys some usual interests and activities. Lives with partner [name] of 20 years. No children. No pets. Family in [place].  Appetite adequate. Not as hungry as she was. Weight stable - 180 pounds. Sleeps well most nights. Averages 8 hours. Focus and concentration has improved. Diagnosed with ADHD March 2020 Irven Shelling. Completing tasks. Managing aspects of household. 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  Denies SI or HI.  Denies AH or VH. Therapist - Laural Benes - weekly.  Previous medication trials: Adderall, Arville Care  Review of Systems:  Review of Systems  Musculoskeletal: Negative for gait problem.  Neurological: Negative for tremors.  Psychiatric/Behavioral:       Please refer to HPI    Medications: I have reviewed the patient's current medications.  Current Outpatient Medications  Medication Sig Dispense Refill  . Amphetamine Sulfate (EVEKEO) 5 MG TABS Take 5 mg by mouth 2 (two) times daily. 1 qam  For  1 week then 1 qam 1  qnoon 60 tablet 0  . Cholecalciferol (VITAMIN D3) 125 MCG (5000 UT) CAPS Take 2 capsules by mouth daily.    Marland Kitchen DYMISTA 137-50 MCG/ACT SUSP Place 1 spray into the nose daily.  0  . fluticasone (FLONASE) 50 MCG/ACT nasal spray Place 1 spray into both nostrils  daily as needed (for allergies.).     Marland Kitchen loratadine (CLARITIN) 10 MG tablet Take 10 mg by mouth daily as needed for allergies.    . methylphenidate (RITALIN) 10 MG tablet Take 1 tablet (10 mg total) by mouth 2 (two) times daily. 60 tablet 0  . montelukast (SINGULAIR) 10 MG tablet Take 1 tablet by mouth as needed.  0  . naproxen sodium (ALEVE) 220 MG tablet Take 220 mg by mouth 2 (two) times daily as needed.    Marland Kitchen omeprazole (PRILOSEC OTC) 20 MG tablet Take 20 mg by mouth daily as needed (for acid reflux/indigestion.).     No current facility-administered medications for this visit.    Medication Side Effects: None  Allergies:  Allergies  Allergen Reactions  . Barley Green Itching  . Eggs Or Egg-Derived Products Nausea Only  . Ginger Other (See Comments)    Nose itchy  . Imitrex [Sumatriptan Base] Nausea Only and Swelling  . Soy Allergy     Past Medical History:  Diagnosis Date  . ADHD 2020  . Anxiety   . Chlamydia 2000  . Depression   . Dyspareunia   . Family history of adverse reaction to anesthesia    mother with nausea and vomiting  . Fibroid   . GERD (gastroesophageal reflux disease)    occasional, mild  . Headache    due to bnirth control pills  . Heart murmur   . 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 MVP (mitral valve prolapse)   .  Rubella immune 2010    Family History  Problem Relation Age of Onset  . Hypertension Mother   . Anemia Mother   . Hypertension Maternal Grandmother   . Cancer Sister   . Heart attack Paternal Grandfather   . Breast cancer Maternal Aunt 53       x2 recurence age 39    Social History   Socioeconomic History  . Marital status: Soil scientist    Spouse name: Not on file  . Number of children: Not on file  . Years of education: Not on file  . Highest education level: Not on file  Occupational History  . Not on file  Tobacco Use  . Smoking status: Never Smoker  . Smokeless tobacco: Never Used  Vaping  Use  . Vaping Use: Never used  Substance and Sexual Activity  . Alcohol use: Yes    Comment: occ  . Drug use: No  . Sexual activity: Yes    Partners: Male    Birth control/protection: Surgical    Comment: Hyst  Other Topics Concern  . Not on file  Social History Narrative  . Not on file   Social Determinants of Health   Financial Resource Strain:   . Difficulty of Paying Living Expenses: Not on file  Food Insecurity:   . Worried About Charity fundraiser in the Last Year: Not on file  . Ran Out of Food in the Last Year: Not on file  Transportation Needs:   . Lack of Transportation (Medical): Not on file  . Lack of Transportation (Non-Medical): Not on file  Physical Activity:   . Days of Exercise per Week: Not on file  . Minutes of Exercise per Session: Not on file  Stress:   . Feeling of Stress : Not on file  Social Connections:   . Frequency of Communication with Friends and Family: Not on file  . Frequency of Social Gatherings with Friends and Family: Not on file  . Attends Religious Services: Not on file  . Active Member of Clubs or Organizations: Not on file  . Attends Archivist Meetings: Not on file  . Marital Status: Not on file  Intimate Partner Violence:   . Fear of Current or Ex-Partner: Not on file  . Emotionally Abused: Not on file  . Physically Abused: Not on file  . Sexually Abused: Not on file    Past Medical History, Surgical history, Social history, and Family history were reviewed and updated as appropriate.   Please see review of systems for further details on the patient's review from today.   Objective:   Physical Exam:  LMP 03/18/2017 (Exact Date)   Physical Exam Constitutional:      General: She is not in acute distress. Musculoskeletal:        General: No deformity.  Neurological:     Mental Status: She is alert and oriented to person, place, and time.     Coordination: Coordination normal.  Psychiatric:        Attention  and Perception: Attention and perception normal. She does not perceive auditory or visual hallucinations.        Mood and Affect: Mood normal. Mood is not anxious or depressed. Affect is not labile, blunt, angry or inappropriate.        Speech: Speech normal.        Behavior: Behavior normal.        Thought Content: Thought content normal. Thought content is not paranoid or  delusional. Thought content does not include homicidal or suicidal ideation. Thought content does not include homicidal or suicidal plan.        Cognition and Memory: Cognition and memory normal.        Judgment: Judgment normal.     Comments: Insight intact     Lab Review:     Component Value Date/Time   NA 138 06/01/2019 0920   K 4.2 06/01/2019 0920   CL 102 06/01/2019 0920   CO2 24 06/01/2019 0920   GLUCOSE 88 06/01/2019 0920   GLUCOSE 78 01/17/2012 1704   BUN 10 06/01/2019 0920   CREATININE 0.86 06/01/2019 0920   CALCIUM 8.8 06/01/2019 0920   PROT 6.7 06/01/2019 0920   ALBUMIN 4.3 06/01/2019 0920   AST 12 06/01/2019 0920   ALT 14 06/01/2019 0920   ALKPHOS 94 06/01/2019 0920   BILITOT 0.5 06/01/2019 0920   GFRNONAA 84 06/01/2019 0920   GFRAA 97 06/01/2019 0920       Component Value Date/Time   WBC 5.1 06/01/2019 0920   WBC 6.0 07/02/2017 0848   RBC 4.60 06/01/2019 0920   RBC 4.74 07/02/2017 0848   HGB 13.6 06/01/2019 0920   HGB 14.6 11/24/2014 1031   HCT 40.7 06/01/2019 0920   PLT 231 06/01/2019 0920   MCV 89 06/01/2019 0920   MCH 29.6 06/01/2019 0920   MCH 30.8 07/02/2017 0848   MCHC 33.4 06/01/2019 0920   MCHC 34.4 07/02/2017 0848   RDW 13.0 06/01/2019 0920   LYMPHSABS 1.6 09/02/2013 1026   MONOABS 0.4 09/02/2013 1026   EOSABS 0.4 09/02/2013 1026   BASOSABS 0.0 09/02/2013 1026    No results found for: POCLITH, LITHIUM   No results found for: PHENYTOIN, PHENOBARB, VALPROATE, CBMZ   .res Assessment: Plan:    Plan:  PDMP reviewed  1. Continue Ritalin 10mg  BID - may increase dose  between appointments.  RTC 4 weeks  Patient advised to contact office with any questions, adverse effects, or acute worsening in signs and symptoms.  Discussed potential benefits, risks, and side effects of stimulants with patient to include increased heart rate, palpitations, insomnia, increased anxiety, increased irritability, or decreased appetite.  Instructed patient to contact office if experiencing any significant tolerability issues.  Time spent with patient was 60 minutes.   Greater than 50% of face to face time with patient was spent on counseling and coordination of care. We discussed ADHD medications, supplementation, ADDitutde website and dietary choices.   There are no diagnoses linked to this encounter.   Please see After Visit Summary for patient specific instructions.  Future Appointments  Date Time Provider [place]  06/01/2020  3:00 PM Yisroel Ramming, Everardo All, MD [place] None    No orders of the defined types were placed in this encounter.   -------------------------------

## 2020-04-20 ENCOUNTER — Ambulatory Visit: Payer: 59 | Admitting: Psychiatry

## 2020-04-20 ENCOUNTER — Ambulatory Visit (INDEPENDENT_AMBULATORY_CARE_PROVIDER_SITE_OTHER): Payer: 59 | Admitting: Adult Health

## 2020-04-20 ENCOUNTER — Other Ambulatory Visit: Payer: Self-pay

## 2020-04-20 ENCOUNTER — Telehealth (HOSPITAL_COMMUNITY): Payer: 59 | Admitting: Psychiatry

## 2020-04-20 ENCOUNTER — Encounter: Payer: Self-pay | Admitting: Adult Health

## 2020-04-20 DIAGNOSIS — F900 Attention-deficit hyperactivity disorder, predominantly inattentive type: Secondary | ICD-10-CM | POA: Diagnosis not present

## 2020-04-20 MED ORDER — METHYLPHENIDATE HCL 10 MG PO TABS: 10.0000 mg | ORAL_TABLET | Freq: Two times a day (BID) | ORAL | 0 refills | Status: DC

## 2020-04-20 NOTE — Progress Notes (Signed)
[place] [id number] 03/03/1976 44 y.o.  Subjective:   Patient ID:  Caroline Hayes is a 44 y.o. (DOB Apr 17, 1976) female.  Chief Complaint: No chief complaint on file.   HPI Caroline Hayes presents to the office today for follow-up of ADHD.  Describes mood today as "ok". Pleasant. Mood symptoms - denies depression, anxiety, and irritability. Stating "I'm doing pretty good". Tolerating Ritalin 10mg  twice daily. Denies "brain fog". Concerned about mother. Does not take medications on weekends. Stable interest and motivation. Taking medications as prescribed.  Energy levels stable. Active, has a regular exercise routine.  Enjoys some usual interests and activities. Lives with partner [name] of 20 years. No children. No pets. Family in [place].  Appetite adequate. Weight stable - 180 pounds. Sleeps well most nights. Averages 8 hours. Focus and concentration improved overal. Diagnosed with ADHD March 2020 - Irven Shelling. Completing tasks. Managing aspects of household. 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  Denies SI or HI.  Denies AH or VH. Therapist - Laural Benes - weekly.  Previous medication trials: Adderall, Arville Care  Review of Systems:  Review of Systems  Musculoskeletal: Negative for gait problem.  Neurological: Negative for tremors.  Psychiatric/Behavioral:       Please refer to HPI    Medications: I have reviewed the patient's current medications.  Current Outpatient Medications  Medication Sig Dispense Refill  . Cholecalciferol (VITAMIN D3) 125 MCG (5000 UT) CAPS Take 2 capsules by mouth daily.    Marland Kitchen DYMISTA 137-50 MCG/ACT SUSP Place 1 spray into the nose daily.  0  . fluticasone (FLONASE) 50 MCG/ACT nasal spray Place 1 spray into both nostrils daily as needed (for allergies.).     Marland Kitchen loratadine (CLARITIN) 10 MG tablet Take 10 mg by mouth daily as needed for allergies.    . methylphenidate (RITALIN) 10 MG tablet Take 1 tablet  (10 mg total) by mouth 2 (two) times daily. 60 tablet 0  . montelukast (SINGULAIR) 10 MG tablet Take 1 tablet by mouth as needed.  0  . naproxen sodium (ALEVE) 220 MG tablet Take 220 mg by mouth 2 (two) times daily as needed.    Marland Kitchen omeprazole (PRILOSEC OTC) 20 MG tablet Take 20 mg by mouth daily as needed (for acid reflux/indigestion.).     No current facility-administered medications for this visit.    Medication Side Effects: None  Allergies:  Allergies  Allergen Reactions  . Barley Green Itching  . Eggs Or Egg-Derived Products Nausea Only  . Ginger Other (See Comments)    Nose itchy  . Imitrex [Sumatriptan Base] Nausea Only and Swelling  . Soy Allergy     Past Medical History:  Diagnosis Date  . ADHD 2020  . Anxiety   . Chlamydia 2000  . Depression   . Dyspareunia   . Family history of adverse reaction to anesthesia    mother with nausea and vomiting  . Fibroid   . GERD (gastroesophageal reflux disease)    occasional, mild  . Headache    due to bnirth control pills  . Heart murmur   . 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 MVP (mitral valve prolapse)   . Rubella immune 2010    Family History  Problem Relation Age of Onset  . Hypertension Mother   . Anemia Mother   . Hypertension Maternal Grandmother   . Cancer Sister   . Heart attack Paternal Grandfather   .  Breast cancer Maternal Aunt 23       x2 recurence age 2    Social History   Socioeconomic History  . Marital status: Soil scientist    Spouse name: Not on file  . Number of children: Not on file  . Years of education: Not on file  . Highest education level: Not on file  Occupational History  . Not on file  Tobacco Use  . Smoking status: Never Smoker  . Smokeless tobacco: Never Used  Vaping Use  . Vaping Use: Never used  Substance and Sexual Activity  . Alcohol use: Yes    Comment: occ  . Drug use: No  . Sexual activity: Yes    Partners: Male    Birth  control/protection: Surgical    Comment: Hyst  Other Topics Concern  . Not on file  Social History Narrative  . Not on file   Social Determinants of Health   Financial Resource Strain:   . Difficulty of Paying Living Expenses: Not on file  Food Insecurity:   . Worried About Charity fundraiser in the Last Year: Not on file  . Ran Out of Food in the Last Year: Not on file  Transportation Needs:   . Lack of Transportation (Medical): Not on file  . Lack of Transportation (Non-Medical): Not on file  Physical Activity:   . Days of Exercise per Week: Not on file  . Minutes of Exercise per Session: Not on file  Stress:   . Feeling of Stress : Not on file  Social Connections:   . Frequency of Communication with Friends and Family: Not on file  . Frequency of Social Gatherings with Friends and Family: Not on file  . Attends Religious Services: Not on file  . Active Member of Clubs or Organizations: Not on file  . Attends Archivist Meetings: Not on file  . Marital Status: Not on file  Intimate Partner Violence:   . Fear of Current or Ex-Partner: Not on file  . Emotionally Abused: Not on file  . Physically Abused: Not on file  . Sexually Abused: Not on file    Past Medical History, Surgical history, Social history, and Family history were reviewed and updated as appropriate.   Please see review of systems for further details on the patient's review from today.   Objective:   Physical Exam:  LMP 03/18/2017 (Exact Date)   Physical Exam Constitutional:      General: She is not in acute distress. Eyes:     General: No scleral icterus. Musculoskeletal:        General: No deformity.  Neurological:     Mental Status: She is alert and oriented to person, place, and time.     Coordination: Coordination normal.  Psychiatric:        Attention and Perception: Attention and perception normal. She does not perceive auditory or visual hallucinations.        Mood and Affect:  Mood normal. Mood is not anxious or depressed. Affect is not labile, blunt, angry or inappropriate.        Speech: Speech normal.        Behavior: Behavior normal.        Thought Content: Thought content normal. Thought content is not paranoid or delusional. Thought content does not include homicidal or suicidal ideation. Thought content does not include homicidal or suicidal plan.        Cognition and Memory: Cognition and memory normal.  Judgment: Judgment normal.     Comments: Insight intact     Lab Review:     Component Value Date/Time   NA 138 06/01/2019 0920   K 4.2 06/01/2019 0920   CL 102 06/01/2019 0920   CO2 24 06/01/2019 0920   GLUCOSE 88 06/01/2019 0920   GLUCOSE 78 01/17/2012 1704   BUN 10 06/01/2019 0920   CREATININE 0.86 06/01/2019 0920   CALCIUM 8.8 06/01/2019 0920   PROT 6.7 06/01/2019 0920   ALBUMIN 4.3 06/01/2019 0920   AST 12 06/01/2019 0920   ALT 14 06/01/2019 0920   ALKPHOS 94 06/01/2019 0920   BILITOT 0.5 06/01/2019 0920   GFRNONAA 84 06/01/2019 0920   GFRAA 97 06/01/2019 0920       Component Value Date/Time   WBC 5.1 06/01/2019 0920   WBC 6.0 07/02/2017 0848   RBC 4.60 06/01/2019 0920   RBC 4.74 07/02/2017 0848   HGB 13.6 06/01/2019 0920   HGB 14.6 11/24/2014 1031   HCT 40.7 06/01/2019 0920   PLT 231 06/01/2019 0920   MCV 89 06/01/2019 0920   MCH 29.6 06/01/2019 0920   MCH 30.8 07/02/2017 0848   MCHC 33.4 06/01/2019 0920   MCHC 34.4 07/02/2017 0848   RDW 13.0 06/01/2019 0920   LYMPHSABS 1.6 09/02/2013 1026   MONOABS 0.4 09/02/2013 1026   EOSABS 0.4 09/02/2013 1026   BASOSABS 0.0 09/02/2013 1026    No results found for: POCLITH, LITHIUM   No results found for: PHENYTOIN, PHENOBARB, VALPROATE, CBMZ   .res Assessment: Plan:    Plan:  PDMP reviewed  1. Continue Ritalin 10mg  BID - may increase dose between appointments.  RTC 4 weeks  Patient advised to contact office with any questions, adverse effects, or acute worsening  in signs and symptoms.  Discussed potential benefits, risks, and side effects of stimulants with patient to include increased heart rate, palpitations, insomnia, increased anxiety, increased irritability, or decreased appetite.  Instructed patient to contact office if experiencing any significant tolerability issues.  Diagnoses and all orders for this visit:  Attention deficit hyperactivity disorder (ADHD), predominantly inattentive type -     methylphenidate (RITALIN) 10 MG tablet; Take 1 tablet (10 mg total) by mouth 2 (two) times daily.     Please see After Visit Summary for patient specific instructions.  Future Appointments  Date Time Provider [place]  06/01/2020  3:00 PM Yisroel Ramming, Everardo All, MD [place] None    No orders of the defined types were placed in this encounter.   -------------------------------

## 2020-05-18 ENCOUNTER — Ambulatory Visit (INDEPENDENT_AMBULATORY_CARE_PROVIDER_SITE_OTHER): Payer: 59 | Admitting: Adult Health

## 2020-05-18 ENCOUNTER — Other Ambulatory Visit: Payer: Self-pay

## 2020-05-18 ENCOUNTER — Encounter: Payer: Self-pay | Admitting: Adult Health

## 2020-05-18 DIAGNOSIS — F900 Attention-deficit hyperactivity disorder, predominantly inattentive type: Secondary | ICD-10-CM | POA: Diagnosis not present

## 2020-05-18 MED ORDER — METHYLPHENIDATE HCL 10 MG PO TABS: 10.0000 mg | ORAL_TABLET | Freq: Two times a day (BID) | ORAL | 0 refills | Status: DC

## 2020-05-18 NOTE — Progress Notes (Signed)
[place] [id number] 04/18/1976 44 y.o.  Subjective:   Patient ID:  Caroline Hayes is a 44 y.o. (DOB [birth year]) female.  Chief Complaint: No chief complaint on file.   HPI Caroline Hayes presents to the office today for follow-up of ADHD.  Describes mood today as "ok". Pleasant. Mood symptoms - denies depression, anxiety, and irritability. Stating "I'm doing alright". Feels like Ritalin 10mg  twice daily is working well. Does not take on weekends. Concerned about mother. She and partner doing well. Stable interest and motivation. Taking medications as prescribed.  Energy levels stable. Active, has a regular exercise routine.  Enjoys some usual interests and activities. Lives with partner [name] of 20 years. No children. No pets. Family in [place].  Appetite adequate. Weight stable - 180 pounds. Sleeps well most nights. Averages 8 hours. Focus and concentration improved. Completing tasks. Managing aspects of household. 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  Denies SI or HI.  Denies AH or VH. Therapist - Laural Benes - weekly.  Previous medication trials: Adderall, [facility]  Review of Systems:  Review of Systems  Musculoskeletal: Negative for gait problem.  Neurological: Negative for tremors.  Psychiatric/Behavioral:       Please refer to HPI    Medications: I have reviewed the patient's current medications.  Current Outpatient Medications  Medication Sig Dispense Refill   Cholecalciferol (VITAMIN D3) 125 MCG (5000 UT) CAPS Take 2 capsules by mouth daily.     DYMISTA 137-50 MCG/ACT SUSP Place 1 spray into the nose daily.  0   fluticasone (FLONASE) 50 MCG/ACT nasal spray Place 1 spray into both nostrils daily as needed (for allergies.).      loratadine (CLARITIN) 10 MG tablet Take 10 mg by mouth daily as needed for allergies.     methylphenidate (RITALIN) 10 MG tablet Take 1 tablet (10 mg total) by mouth 2 (two) times daily. 60  tablet 0   montelukast (SINGULAIR) 10 MG tablet Take 1 tablet by mouth as needed.  0   naproxen sodium (ALEVE) 220 MG tablet Take 220 mg by mouth 2 (two) times daily as needed.     omeprazole (PRILOSEC OTC) 20 MG tablet Take 20 mg by mouth daily as needed (for acid reflux/indigestion.).     No current facility-administered medications for this visit.    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   MVP (mitral valve prolapse)    Rubella immune 2010    Family History  Problem Relation Age of Onset   Hypertension Mother    Anemia Mother    Hypertension Maternal Grandmother    Cancer Sister    Heart attack Paternal Grandfather    Breast cancer Maternal  Aunt 85       x2 recurence age 59    Social History   Socioeconomic History   Marital status: Soil scientist    Spouse name: Not on file   Number of children: Not on file   Years of education: Not on file   Highest education level: Not on file  Occupational History   Not on file  Tobacco Use   Smoking status: Never Smoker   Smokeless tobacco: Never Used  Vaping Use   Vaping Use: Never used  Substance and Sexual Activity   Alcohol use: Yes    Comment: occ   Drug use: No   Sexual activity: Yes    Partners: Male    Birth control/protection: Surgical    Comment: Hyst  Other Topics  Concern   Not on file  Social History Narrative   Not on file   Social Determinants of Health   Financial Resource Strain:    Difficulty of Paying Living Expenses: Not on file  Food Insecurity:    Worried About Charity fundraiser in the Last Year: Not on file   [employer] of Food in the Last Year: Not on file  Transportation Needs:    Lack of Transportation (Medical): Not on file   Lack of Transportation (Non-Medical): Not on file  Physical Activity:    Days of Exercise per Week: Not on file   Minutes of Exercise per Session: Not on file  Stress:    Feeling of Stress : Not on file  Social Connections:    Frequency of Communication with Friends and Family: Not on file   Frequency of Social Gatherings with Friends and Family: Not on file   Attends Religious Services: Not on file   Active Member of Clubs or Organizations: Not on file   Attends Archivist Meetings: Not on file   Marital Status: Not on file  Intimate Partner Violence:    Fear of Current or Ex-Partner: Not on file   Emotionally Abused: Not on file   Physically Abused: Not on file   Sexually Abused: Not on file    Past Medical History, Surgical history, Social history, and Family history were reviewed and updated as appropriate.   Please see review of systems for further details on the patient's review from today.   Objective:   Physical Exam:  LMP 03/18/2017 (Exact Date)   Physical Exam Constitutional:      General: She is not in acute distress. Musculoskeletal:        General: No deformity.  Neurological:     Mental Status: She is alert and oriented to person, place, and time.     Coordination: Coordination normal.  Psychiatric:        Attention and Perception: Attention and perception normal. She does not perceive auditory or visual hallucinations.        Mood and Affect: Mood normal. Mood is not anxious or depressed. Affect is not labile, blunt, angry or inappropriate.         Speech: Speech normal.        Behavior: Behavior normal.        Thought Content: Thought content normal. Thought content is not paranoid or delusional. Thought content does not include homicidal or suicidal ideation. Thought content does not include homicidal or suicidal plan.        Cognition and Memory: Cognition and memory normal.        Judgment: Judgment normal.  Comments: Insight intact     Lab Review:     Component Value Date/Time   NA 138 06/01/2019 0920   K 4.2 06/01/2019 0920   CL 102 06/01/2019 0920   CO2 24 06/01/2019 0920   GLUCOSE 88 06/01/2019 0920   GLUCOSE 78 01/17/2012 1704   BUN 10 06/01/2019 0920   CREATININE 0.86 06/01/2019 0920   CALCIUM 8.8 06/01/2019 0920   PROT 6.7 06/01/2019 0920   ALBUMIN 4.3 06/01/2019 0920   AST 12 06/01/2019 0920   ALT 14 06/01/2019 0920   ALKPHOS 94 06/01/2019 0920   BILITOT 0.5 06/01/2019 0920   GFRNONAA 84 06/01/2019 0920   GFRAA 97 06/01/2019 0920       Component Value Date/Time   WBC 5.1 06/01/2019 0920   WBC 6.0 07/02/2017 0848   RBC 4.60 06/01/2019 0920   RBC 4.74 07/02/2017 0848   HGB 13.6 06/01/2019 0920   HGB 14.6 11/24/2014 1031   HCT 40.7 06/01/2019 0920   PLT 231 06/01/2019 0920   MCV 89 06/01/2019 0920   MCH 29.6 06/01/2019 0920   MCH 30.8 07/02/2017 0848   MCHC 33.4 06/01/2019 0920   MCHC 34.4 07/02/2017 0848   RDW 13.0 06/01/2019 0920   LYMPHSABS 1.6 09/02/2013 1026   MONOABS 0.4 09/02/2013 1026   EOSABS 0.4 09/02/2013 1026   BASOSABS 0.0 09/02/2013 1026    No results found for: POCLITH, LITHIUM   No results found for: PHENYTOIN, PHENOBARB, VALPROATE, CBMZ   .res Assessment: Plan:    Plan:  PDMP reviewed  1. Continue Ritalin 10mg  BID.  RTC 4 weeks  Patient advised to contact office with any questions, adverse effects, or acute worsening in signs and symptoms.  Discussed potential benefits, risks, and side effects of stimulants with patient to include increased heart rate,  palpitations, insomnia, increased anxiety, increased irritability, or decreased appetite.  Instructed patient to contact office if experiencing any significant tolerability issues.   Diagnoses and all orders for this visit:  Attention deficit hyperactivity disorder (ADHD), predominantly inattentive type -     methylphenidate (RITALIN) 10 MG tablet; Take 1 tablet (10 mg total) by mouth 2 (two) times daily.     Please see After Visit Summary for patient specific instructions.  Future Appointments  Date Time Provider [place]  06/01/2020  3:00 PM Yisroel Ramming, Everardo All, MD [place] None    No orders of the defined types were placed in this encounter.   -------------------------------

## 2020-05-31 NOTE — Progress Notes (Signed)
44 y.o. G0P0 Domestic Partner Caucasian female here for annual exam.    Patient complaining of pelvic cramping. Cramping three days this week. Occurs once a month.  Uses heat and Aleve. No vaginal bleeding.   Now on Ritalin and doing better.  Some chest discomfort.  Wonders it if is due to stress.   Donates blood.  Last donation was just before Thanksgiving.   Received her Covid booster.   Notes family history to reaction to anesthesia.  PCP:  None   Patient's last menstrual period was 03/18/2017 (exact date).     Period Pattern: Regular     Sexually active: Yes.    The current method of family planning is status post hysterectomy.    Exercising: Yes.    cycling, hiking, walking and some jogging Smoker:  no  Health Maintenance: Pap: 06-01-19 ASCUS:Neg HR HPV, 11-28-16 Neg:Neg HR HPV, 11-24-14 Neg:Neg HR HPV History of abnormal Pap:  Yes,LGSIL:Pos HR HPV with several colpo's 2003 for CIN II & 2009?--negative.  Cervix benign at time of hysterectomy.  MMG: 07-29-19 --See Epic.  Benign.  Colonoscopy: 06/2019 normal other than hemorrhoids--due to bleeding BMD:  n/a  Result  n/a TDaP:  10-14-17 Gardasil:   no HIV:Neg per patient Hep C:Neg per patient Screening Labs:  Will return for fasting labs.   reports that she has never smoked. She has never used smokeless tobacco. She reports current alcohol use of about 1.0 standard drink of alcohol per week. She reports that she does not use drugs.  Past Medical History:  Diagnosis Date  . ADHD 2020  . Anxiety   . Chlamydia 2000  . Depression   . Dyspareunia   . Family history of adverse reaction to anesthesia    mother with nausea and vomiting  . Fibroid   . GERD (gastroesophageal reflux disease)    occasional, mild  . Headache    due to bnirth control pills  . Heart murmur   . 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 MVP (mitral valve prolapse)   . Rubella immune 2010    Past Surgical History:  Procedure  Laterality Date  . CYSTOSCOPY N/A 07/09/2017   Procedure: CYSTOSCOPY;  Surgeon: Megan Salon, MD;  Location: [facility];  Service: Gynecology;  Laterality: N/A;  . IUD REMOVAL N/A 07/09/2017   Procedure: INTRAUTERINE DEVICE (IUD) REMOVAL;  Surgeon: Megan Salon, MD;  Location: [facility];  Service: Gynecology;  Laterality: N/A;  . ROOT CANAL  07/2014  . TOTAL LAPAROSCOPIC HYSTERECTOMY WITH SALPINGECTOMY Bilateral 07/09/2017   Procedure: TOTAL LAPAROSCOPIC HYSTERECTOMY WITH SALPINGECTOMY;  Surgeon: Megan Salon, MD;  Location: [facility];  Service: Gynecology;  Laterality: Bilateral;  . WISDOM TOOTH EXTRACTION  1999    Current Outpatient Medications  Medication Sig Dispense Refill  . Cholecalciferol (VITAMIN D3) 125 MCG (5000 UT) CAPS Take 2 capsules by mouth daily.    Marland Kitchen loratadine (CLARITIN) 10 MG tablet Take 10 mg by mouth daily as needed for allergies.    . methylphenidate (RITALIN) 10 MG tablet Take 1 tablet (10 mg total) by mouth 2 (two) times daily. 60 tablet 0  . metroNIDAZOLE-Cleanser 0.75 % (Cream) KIT Apply topically.    Marland Kitchen omeprazole (PRILOSEC OTC) 20 MG tablet Take 20 mg by mouth daily as needed (for acid reflux/indigestion.).     No current facility-administered medications for this visit.    Family History  Problem Relation Age of Onset  . Hypertension Mother   .  Anemia Mother   . Hypertension Maternal Grandmother   . Cancer Sister   . Heart attack Paternal Grandfather   . Basal cell carcinoma Father   . Hypertension Father   . Cancer Maternal Grandfather        prostate in his 67's  . Breast cancer Maternal Aunt 62       x2 recurence age 42    Review of Systems  All other systems reviewed and are negative.   Exam:   BP 122/80   Pulse (!) 55   Ht $R'5\' 8"'hc$  (1.727 m)   Wt 181 lb (82.1 kg)   LMP 03/18/2017 (Exact Date)   SpO2 99%   BMI 27.52 kg/m     General appearance: alert, cooperative and appears stated age Head: normocephalic, without obvious abnormality,  atraumatic Neck: no adenopathy, supple, symmetrical, trachea midline and thyroid normal to inspection and palpation Lungs: clear to auscultation bilaterally Breasts: normal appearance, no masses or tenderness, No nipple retraction or dimpling, No nipple discharge or bleeding, No axillary adenopathy Heart: regular rate and rhythm Abdomen: soft, non-tender; no masses, no organomegaly Extremities: extremities normal, atraumatic, no cyanosis or edema Skin: skin color, texture, turgor normal. No rashes or lesions Lymph nodes: cervical, supraclavicular, and axillary nodes normal. Neurologic: grossly normal  Pelvic: External genitalia:  no lesions              No abnormal inguinal nodes palpated.              Urethra:  normal appearing urethra with no masses, tenderness or lesions              Bartholins and Skenes: normal                 Vagina: normal appearing vagina with normal color and discharge, no lesions              Cervix: absent              Pap taken: No. Bimanual Exam:  Uterus:  absent              Adnexa: no mass, fullness, tenderness              Rectal exam: Yes.  .  Confirms.              Anus:  normal sphincter tone, no lesions  Chaperone was present for exam.  Assessment:   Well woman visit with normal exam. Status post laparoscopic hysterectomy with bilateral salpingectomy for dysmenorrhea and fibroid, Jan 2019. Post op vaginal cuff hematoma.  Normal cervical cytology.  Hx prior CIN II. Ovulation pain.   Plan: Mammogram screening discussed. Self breast awareness reviewed. Pap and HR HPV  In 2023. Guidelines for Calcium, Vitamin D, regular exercise program including cardiovascular and weight bearing exercise. Return for fasting labs.  We discussed chest pain with exertion as a sign of potential cardiac origin.  List of PCPs to patient.  Follow up annually and prn.

## 2020-06-01 ENCOUNTER — Other Ambulatory Visit: Payer: Self-pay

## 2020-06-01 ENCOUNTER — Encounter: Payer: Self-pay | Admitting: Obstetrics and Gynecology

## 2020-06-01 ENCOUNTER — Ambulatory Visit (INDEPENDENT_AMBULATORY_CARE_PROVIDER_SITE_OTHER): Payer: Managed Care, Other (non HMO) | Admitting: Obstetrics and Gynecology

## 2020-06-01 VITALS — BP 122/80 | HR 55 | Ht 68.0 in | Wt 181.0 lb

## 2020-06-01 DIAGNOSIS — Z01419 Encounter for gynecological examination (general) (routine) without abnormal findings: Secondary | ICD-10-CM

## 2020-06-01 NOTE — Patient Instructions (Signed)

## 2020-06-08 ENCOUNTER — Other Ambulatory Visit: Payer: Self-pay

## 2020-06-08 ENCOUNTER — Other Ambulatory Visit: Payer: Managed Care, Other (non HMO)

## 2020-06-08 DIAGNOSIS — Z01419 Encounter for gynecological examination (general) (routine) without abnormal findings: Secondary | ICD-10-CM

## 2020-06-08 DIAGNOSIS — Z0000 Encounter for general adult medical examination without abnormal findings: Secondary | ICD-10-CM

## 2020-06-09 LAB — CBC
Hematocrit: 39 % (ref 34.0–46.6)
Hemoglobin: 13.2 g/dL (ref 11.1–15.9)
MCH: 30.8 pg (ref 26.6–33.0)
MCHC: 33.8 g/dL (ref 31.5–35.7)
MCV: 91 fL (ref 79–97)
Platelets: 186 10*3/uL (ref 150–450)
RBC: 4.28 x10E6/uL (ref 3.77–5.28)
RDW: 12.4 % (ref 11.7–15.4)
WBC: 5.4 10*3/uL (ref 3.4–10.8)

## 2020-06-09 LAB — LIPID PANEL
Chol/HDL Ratio: 4.6 ratio — ABNORMAL HIGH (ref 0.0–4.4)
Cholesterol, Total: 205 mg/dL — ABNORMAL HIGH (ref 100–199)
HDL: 45 mg/dL (ref >39)
LDL Chol Calc (NIH): 135 mg/dL — ABNORMAL HIGH (ref 0–99)
Triglycerides: 139 mg/dL (ref 0–149)
VLDL Cholesterol Cal: 25 mg/dL (ref 5–40)

## 2020-06-09 LAB — COMPREHENSIVE METABOLIC PANEL
ALT: 28 IU/L (ref 0–32)
AST: 17 IU/L (ref 0–40)
Albumin/Globulin Ratio: 1.8 (ref 1.2–2.2)
Albumin: 4.2 g/dL (ref 3.8–4.8)
Alkaline Phosphatase: 82 IU/L (ref 44–121)
BUN/Creatinine Ratio: 11 (ref 9–23)
BUN: 8 mg/dL (ref 6–24)
Bilirubin Total: 0.8 mg/dL (ref 0.0–1.2)
CO2: 22 mmol/L (ref 20–29)
Calcium: 8.7 mg/dL (ref 8.7–10.2)
Chloride: 107 mmol/L — ABNORMAL HIGH (ref 96–106)
Creatinine, Ser: 0.76 mg/dL (ref 0.57–1.00)
GFR calc Af Amer: 112 mL/min/{1.73_m2} (ref >59)
GFR calc non Af Amer: 97 mL/min/{1.73_m2} (ref >59)
Globulin, Total: 2.3 g/dL (ref 1.5–4.5)
Glucose: 89 mg/dL (ref 65–99)
Potassium: 4.3 mmol/L (ref 3.5–5.2)
Sodium: 141 mmol/L (ref 134–144)
Total Protein: 6.5 g/dL (ref 6.0–8.5)

## 2020-08-10 ENCOUNTER — Other Ambulatory Visit: Payer: Self-pay

## 2020-08-10 ENCOUNTER — Ambulatory Visit (INDEPENDENT_AMBULATORY_CARE_PROVIDER_SITE_OTHER): Payer: 59 | Admitting: Adult Health

## 2020-08-10 ENCOUNTER — Encounter: Payer: Self-pay | Admitting: Adult Health

## 2020-08-10 DIAGNOSIS — F900 Attention-deficit hyperactivity disorder, predominantly inattentive type: Secondary | ICD-10-CM

## 2020-08-10 MED ORDER — METHYLPHENIDATE HCL 10 MG PO TABS: 10.0000 mg | ORAL_TABLET | Freq: Two times a day (BID) | ORAL | 0 refills | Status: DC

## 2020-08-10 NOTE — Progress Notes (Signed)
[place] [id number] January 28, 1976 45 y.o.  Subjective:   Patient ID:  Caroline Hayes is a 45 y.o. (DOB 03/16/1976) female.  Chief Complaint: No chief complaint on file.   HPI Caroline Hayes presents to the office today for follow-up of ADHD.  Describes mood today as "ok". Pleasant. Mood symptoms - denies depression, anxiety, and irritability. Stating "everything is going ok". Feels like Ritalin 54m twice daily works well. Does not take on weekends. She and partner doing well. Stable interest and motivation. Taking medications as prescribed.  Energy levels stable. Active, has a regular exercise routine.  Enjoys some usual interests and activities. Lives with partner ([name] of 20 years. No children. [place] pets[place]  Appetite adequate. Weight stable - 180 pounds. Sleeps well most nights. Averages 8 hours. Focus and concentration improved. Completing tasks. Managing aspects of household. 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  Denies SI or HI.  Denies AH or VH. Therapist - MLaural Benes- weekly.  Previous medication trials: Adderall, E[facility]ville Care   Review of Systems:  Review of Systems  Musculoskeletal: Negative for gait problem.  Neurological: Negative for tremors.  Psychiatric/Behavioral:       Please refer to HPI    Medications: I have reviewed the patient's current medications.  Current Outpatient Medications  Medication Sig Dispense Refill  . [START ON 09/07/2020] methylphenidate (RITALIN) 10 MG tablet Take 1 tablet (10 mg total) by mouth 2 (two) times daily. 60 tablet 0  . [START ON 10/05/2020] methylphenidate (RITALIN) 10 MG tablet Take 1 tablet (10 mg total) by mouth 2 (two) times daily. 60 tablet 0  . Cholecalciferol (VITAMIN D3) 125 MCG (5000 UT) CAPS Take 2 capsules by mouth daily.    .Marland Kitchenloratadine (CLARITIN) 10 MG tablet Take 10 mg by mouth daily as needed for allergies.    . methylphenidate (RITALIN) 10 MG tablet  Take 1 tablet (10 mg total) by mouth 2 (two) times daily. 60 tablet 0  . metroNIDAZOLE-Cleanser 0.75 % (Cream) KIT Apply topically.    .Marland Kitchenomeprazole (PRILOSEC OTC) 20 MG tablet Take 20 mg by mouth daily as needed (for acid reflux/indigestion.).     No current facility-administered medications for this visit.    Medication Side Effects: None  Allergies:  Allergies  Allergen Reactions  . Barley Green Itching  . Eggs Or Egg-Derived Products Nausea Only  . Ginger Other (See Comments)    Nose itchy  . Imitrex [Sumatriptan Base] Nausea Only and Swelling  . Soy Allergy     Past Medical History:  Diagnosis Date  . ADHD 2020  . Anxiety   . Chlamydia 2000  . Depression   . Dyspareunia   . Family history of adverse reaction to anesthesia    mother with nausea and vomiting  . Fibroid   . GERD (gastroesophageal reflux disease)    occasional, mild  . Headache    due to bnirth control pills  . Heart murmur   . 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 MVP (mitral valve prolapse)   . Rubella immune 2010    Family History  Problem Relation Age of Onset  . Hypertension Mother   . Anemia Mother   . Hypertension Maternal Grandmother   . Cancer Sister   . Heart attack Paternal Grandfather   . Basal cell carcinoma Father   . Hypertension Father   . Cancer Maternal Grandfather  prostate in his 62's  . Breast cancer Maternal Aunt 34       x2 recurence age 79    Social History   Socioeconomic History  . Marital status: Soil scientist    Spouse name: Not on file  . Number of children: Not on file  . Years of education: Not on file  . Highest education level: Not on file  Occupational History  . Not on file  Tobacco Use  . Smoking status: Never Smoker  . Smokeless tobacco: Never Used  Vaping Use  . Vaping Use: Never used  Substance and Sexual Activity  . Alcohol use: Yes    Alcohol/week: 1.0 standard drink    Types: 1 Glasses of wine per week  .  Drug use: No  . Sexual activity: Yes    Partners: Male    Birth control/protection: Surgical    Comment: Hyst  Other Topics Concern  . Not on file  Social History Narrative  . Not on file   Social Determinants of Health   Financial Resource Strain: Not on file  Food Insecurity: Not on file  Transportation Needs: Not on file  Physical Activity: Not on file  Stress: Not on file  Social Connections: Not on file  Intimate Partner Violence: Not on file    Past Medical History, Surgical history, Social history, and Family history were reviewed and updated as appropriate.   Please see review of systems for further details on the patient's review from today.   Objective:   Physical Exam:  LMP 03/18/2017 (Exact Date)   Physical Exam Constitutional:      General: She is not in acute distress. Musculoskeletal:        General: No deformity.  Neurological:     Mental Status: She is alert and oriented to person, place, and time.     Coordination: Coordination normal.  Psychiatric:        Attention and Perception: Attention and perception normal. She does not perceive auditory or visual hallucinations.        Mood and Affect: Mood normal. Mood is not anxious or depressed. Affect is not labile, blunt, angry or inappropriate.        Speech: Speech normal.        Behavior: Behavior normal.        Thought Content: Thought content normal. Thought content is not paranoid or delusional. Thought content does not include homicidal or suicidal ideation. Thought content does not include homicidal or suicidal plan.        Cognition and Memory: Cognition and memory normal.        Judgment: Judgment normal.     Comments: Insight intact     Lab Review:     Component Value Date/Time   NA 141 06/08/2020 0912   K 4.3 06/08/2020 0912   CL 107 (H) 06/08/2020 0912   CO2 22 06/08/2020 0912   GLUCOSE 89 06/08/2020 0912   GLUCOSE 78 01/17/2012 1704   BUN 8 06/08/2020 0912   CREATININE 0.76  06/08/2020 0912   CALCIUM 8.7 06/08/2020 0912   PROT 6.5 06/08/2020 0912   ALBUMIN 4.2 06/08/2020 0912   AST 17 06/08/2020 0912   ALT 28 06/08/2020 0912   ALKPHOS 82 06/08/2020 0912   BILITOT 0.8 06/08/2020 0912   GFRNONAA 97 06/08/2020 0912   GFRAA 112 06/08/2020 0912       Component Value Date/Time   WBC 5.4 06/08/2020 0912   WBC 6.0 07/02/2017 0848  RBC 4.28 06/08/2020 0912   RBC 4.74 07/02/2017 0848   HGB 13.2 06/08/2020 0912   HGB 14.6 11/24/2014 1031   HCT 39.0 06/08/2020 0912   PLT 186 06/08/2020 0912   MCV 91 06/08/2020 0912   MCH 30.8 06/08/2020 0912   MCH 30.8 07/02/2017 0848   MCHC 33.8 06/08/2020 0912   MCHC 34.4 07/02/2017 0848   RDW 12.4 06/08/2020 0912   LYMPHSABS 1.6 09/02/2013 1026   MONOABS 0.4 09/02/2013 1026   EOSABS 0.4 09/02/2013 1026   BASOSABS 0.0 09/02/2013 1026    No results found for: POCLITH, LITHIUM   No results found for: PHENYTOIN, PHENOBARB, VALPROATE, CBMZ   .res Assessment: Plan:    Plan:  PDMP reviewed  1. Continue Ritalin 16m BID  122/77 - 50  RTC 4 weeks  Patient advised to contact office with any questions, adverse effects, or acute worsening in signs and symptoms.  Discussed potential benefits, risks, and side effects of stimulants with patient to include increased heart rate, palpitations, insomnia, increased anxiety, increased irritability, or decreased appetite.  Instructed patient to contact office if experiencing any significant tolerability issues.    Diagnoses and all orders for this visit:  Attention deficit hyperactivity disorder (ADHD), predominantly inattentive type -     methylphenidate (RITALIN) 10 MG tablet; Take 1 tablet (10 mg total) by mouth 2 (two) times daily. -     methylphenidate (RITALIN) 10 MG tablet; Take 1 tablet (10 mg total) by mouth 2 (two) times daily. -     methylphenidate (RITALIN) 10 MG tablet; Take 1 tablet (10 mg total) by mouth 2 (two) times daily.     Please see After Visit  Summary for patient specific instructions.  Future Appointments  Date Time Provider [place] 06/08/2021  1:00 PM AYisroel Ramming BEverardo All MD [facility] None    No orders of the defined types were placed in this encounter.   -------------------------------

## 2020-09-12 ENCOUNTER — Other Ambulatory Visit: Payer: Self-pay | Admitting: Obstetrics and Gynecology

## 2020-09-12 ENCOUNTER — Encounter: Payer: Self-pay | Admitting: Obstetrics and Gynecology

## 2020-09-12 DIAGNOSIS — Z1231 Encounter for screening mammogram for malignant neoplasm of breast: Secondary | ICD-10-CM

## 2020-09-17 DIAGNOSIS — Z1231 Encounter for screening mammogram for malignant neoplasm of breast: Secondary | ICD-10-CM

## 2020-11-07 ENCOUNTER — Other Ambulatory Visit: Payer: Self-pay

## 2020-11-07 ENCOUNTER — Ambulatory Visit (INDEPENDENT_AMBULATORY_CARE_PROVIDER_SITE_OTHER): Payer: 59 | Admitting: Adult Health

## 2020-11-07 ENCOUNTER — Encounter: Payer: Self-pay | Admitting: Adult Health

## 2020-11-07 DIAGNOSIS — F900 Attention-deficit hyperactivity disorder, predominantly inattentive type: Secondary | ICD-10-CM | POA: Diagnosis not present

## 2020-11-07 MED ORDER — METHYLPHENIDATE HCL 10 MG PO TABS: 10.0000 mg | ORAL_TABLET | Freq: Two times a day (BID) | ORAL | 0 refills | Status: DC

## 2020-11-07 NOTE — Progress Notes (Signed)
[place] [id number] [birth year] 45 y.o.  Subjective:   Patient ID:  Caroline Hayes is a 45 y.o. (DOB 12/17/1975) female.  Chief Complaint: No chief complaint on file.   HPI Caroline Hayes presents to the office today for follow-up of ADHD.  Describes mood today as "ok". Pleasant. Mood symptoms - denies depression, anxiety, and irritability. Stating "I'm doing alright". Reports having Covid since last visit. Continues take Ritalin during the week - leaving off on the weekends. Some concerns about long term use. Not remembering peripheral information - details. She and partner doing well. Stable interest and motivation. Taking medications as prescribed.  Energy levels stable. Active, has a regular exercise routine - feels better when exercises.  Enjoys some usual interests and activities. Lives with partner [name] of 20 years. No children. No pets. Family in [place].  Appetite adequate. Weight stable - 180 pounds. Sleeps well most nights. Averages 8 hours. Focus and concentration improved. Completing tasks. Managing aspects of household. Works full time as Chief Financial Officer - mixed scheduled. Denies SI or HI.  Denies AH or VH. Therapist - Laural Benes - has not seen consistently.  Previous medication trials: Adderall, Arville Care   Review of Systems:  Review of Systems  Musculoskeletal: Negative for gait problem.  Neurological: Negative for tremors.  Psychiatric/Behavioral:       Please refer to HPI    Medications: I have reviewed the patient's current medications.  Current Outpatient Medications  Medication Sig Dispense Refill  . Cholecalciferol (VITAMIN D3) 125 MCG (5000 UT) CAPS Take 2 capsules by mouth daily.    Marland Kitchen loratadine (CLARITIN) 10 MG tablet Take 10 mg by mouth daily as needed for allergies.    . methylphenidate (RITALIN) 10 MG tablet Take 1 tablet (10 mg total) by mouth 2 (two) times daily. 60 tablet 0  . [START ON 12/05/2020] methylphenidate  (RITALIN) 10 MG tablet Take 1 tablet (10 mg total) by mouth 2 (two) times daily. 60 tablet 0  . [START ON 01/02/2021] methylphenidate (RITALIN) 10 MG tablet Take 1 tablet (10 mg total) by mouth 2 (two) times daily. 60 tablet 0  . metroNIDAZOLE-Cleanser 0.75 % (Cream) KIT Apply topically.    Marland Kitchen omeprazole (PRILOSEC OTC) 20 MG tablet Take 20 mg by mouth daily as needed (for acid reflux/indigestion.).     No current facility-administered medications for this visit.    Medication Side Effects: None  Allergies:  Allergies  Allergen Reactions  . Barley Green Itching  . Eggs Or Egg-Derived Products Nausea Only  . Ginger Other (See Comments)    Nose itchy  . Imitrex [Sumatriptan Base] Nausea Only and Swelling  . Soy Allergy     Past Medical History:  Diagnosis Date  . ADHD 2020  . Anxiety   . Chlamydia 2000  . Depression   . Dyspareunia   . Family history of adverse reaction to anesthesia    mother with nausea and vomiting  . Fibroid   . GERD (gastroesophageal reflux disease)    occasional, mild  . Headache    due to bnirth control pills  . Heart murmur   . 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 MVP (mitral valve prolapse)   . Rubella immune 2010    Past Medical History, Surgical history, Social history, and Family history were reviewed and updated as appropriate.   Please see review of systems for further details on the patient's review from today.   Objective:  Physical Exam:  LMP 03/18/2017 (Exact Date)   Physical Exam Constitutional:      General: She is not in acute distress. Musculoskeletal:        General: No deformity.  Neurological:     Mental Status: She is alert and oriented to person, place, and time.     Coordination: Coordination normal.  Psychiatric:        Attention and Perception: Attention and perception normal. She does not perceive auditory or visual hallucinations.        Mood and Affect: Mood normal. Mood is not anxious or  depressed. Affect is not labile, blunt, angry or inappropriate.        Speech: Speech normal.        Behavior: Behavior normal.        Thought Content: Thought content normal. Thought content is not paranoid or delusional. Thought content does not include homicidal or suicidal ideation. Thought content does not include homicidal or suicidal plan.        Cognition and Memory: Cognition and memory normal.        Judgment: Judgment normal.     Comments: Insight intact     Lab Review:     Component Value Date/Time   NA 141 06/08/2020 0912   K 4.3 06/08/2020 0912   CL 107 (H) 06/08/2020 0912   CO2 22 06/08/2020 0912   GLUCOSE 89 06/08/2020 0912   GLUCOSE 78 01/17/2012 1704   BUN 8 06/08/2020 0912   CREATININE 0.76 06/08/2020 0912   CALCIUM 8.7 06/08/2020 0912   PROT 6.5 06/08/2020 0912   ALBUMIN 4.2 06/08/2020 0912   AST 17 06/08/2020 0912   ALT 28 06/08/2020 0912   ALKPHOS 82 06/08/2020 0912   BILITOT 0.8 06/08/2020 0912   GFRNONAA 97 06/08/2020 0912   GFRAA 112 06/08/2020 0912       Component Value Date/Time   WBC 5.4 06/08/2020 0912   WBC 6.0 07/02/2017 0848   RBC 4.28 06/08/2020 0912   RBC 4.74 07/02/2017 0848   HGB 13.2 06/08/2020 0912   HGB 14.6 11/24/2014 1031   HCT 39.0 06/08/2020 0912   PLT 186 06/08/2020 0912   MCV 91 06/08/2020 0912   MCH 30.8 06/08/2020 0912   MCH 30.8 07/02/2017 0848   MCHC 33.8 06/08/2020 0912   MCHC 34.4 07/02/2017 0848   RDW 12.4 06/08/2020 0912   LYMPHSABS 1.6 09/02/2013 1026   MONOABS 0.4 09/02/2013 1026   EOSABS 0.4 09/02/2013 1026   BASOSABS 0.0 09/02/2013 1026    No results found for: POCLITH, LITHIUM   No results found for: PHENYTOIN, PHENOBARB, VALPROATE, CBMZ   .res Assessment: Plan:     Plan:  PDMP reviewed  1. Continue Ritalin 72m BID  122/78 44  RTC 4 weeks  Patient advised to contact office with any questions, adverse effects, or acute worsening in signs and symptoms.  Discussed potential benefits,  risks, and side effects of stimulants with patient to include increased heart rate, palpitations, insomnia, increased anxiety, increased irritability, or decreased appetite.  Instructed patient to contact office if experiencing any significant tolerability issues.    Diagnoses and all orders for this visit:  Attention deficit hyperactivity disorder (ADHD), predominantly inattentive type -     methylphenidate (RITALIN) 10 MG tablet; Take 1 tablet (10 mg total) by mouth 2 (two) times daily. -     methylphenidate (RITALIN) 10 MG tablet; Take 1 tablet (10 mg total) by mouth 2 (two) times daily. -  methylphenidate (RITALIN) 10 MG tablet; Take 1 tablet (10 mg total) by mouth 2 (two) times daily.     Please see After Visit Summary for patient specific instructions.  Future Appointments  Date Time Provider [place]  01/16/2021  9:00 AM Hoyt Koch, MD [facility] None  02/07/2021  8:20 AM Vedha Tercero, Berdie Ogren, NP [facility] None  06/08/2021 12:00 PM Nunzio Cobbs, MD [facility] None    No orders of the defined types were placed in this encounter.   -------------------------------

## 2020-11-08 ENCOUNTER — Ambulatory Visit
Admission: RE | Admit: 2020-11-08 | Discharge: 2020-11-08 | Disposition: A | Discharge status: Home / Self Care | Payer: Managed Care, Other (non HMO) | Source: Ambulatory Visit | Attending: Obstetrics and Gynecology | Admitting: Obstetrics and Gynecology

## 2020-11-08 DIAGNOSIS — Z1231 Encounter for screening mammogram for malignant neoplasm of breast: Secondary | ICD-10-CM

## 2020-11-08 IMAGING — MG DIGITAL SCREENING BILATERAL MAMMOGRAM WITH CAD
4 series · 4 of 4 positions shown · non-contrast
Comparison: None.

CLINICAL DATA: Screening.

EXAM:
DIGITAL SCREENING BILATERAL MAMMOGRAM WITH CAD

[R MLO]
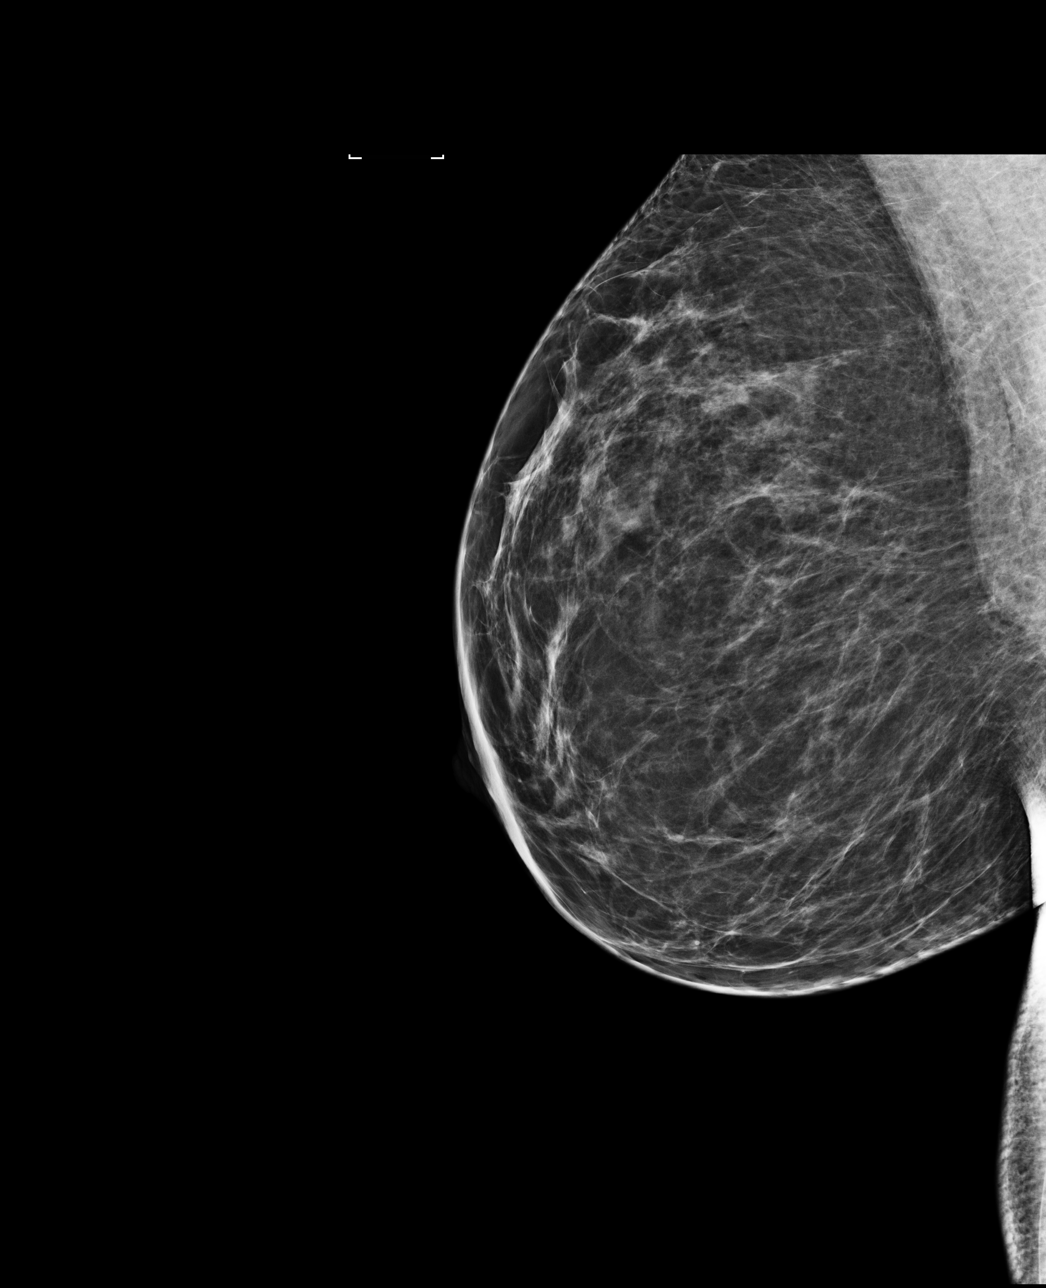

[R CC]
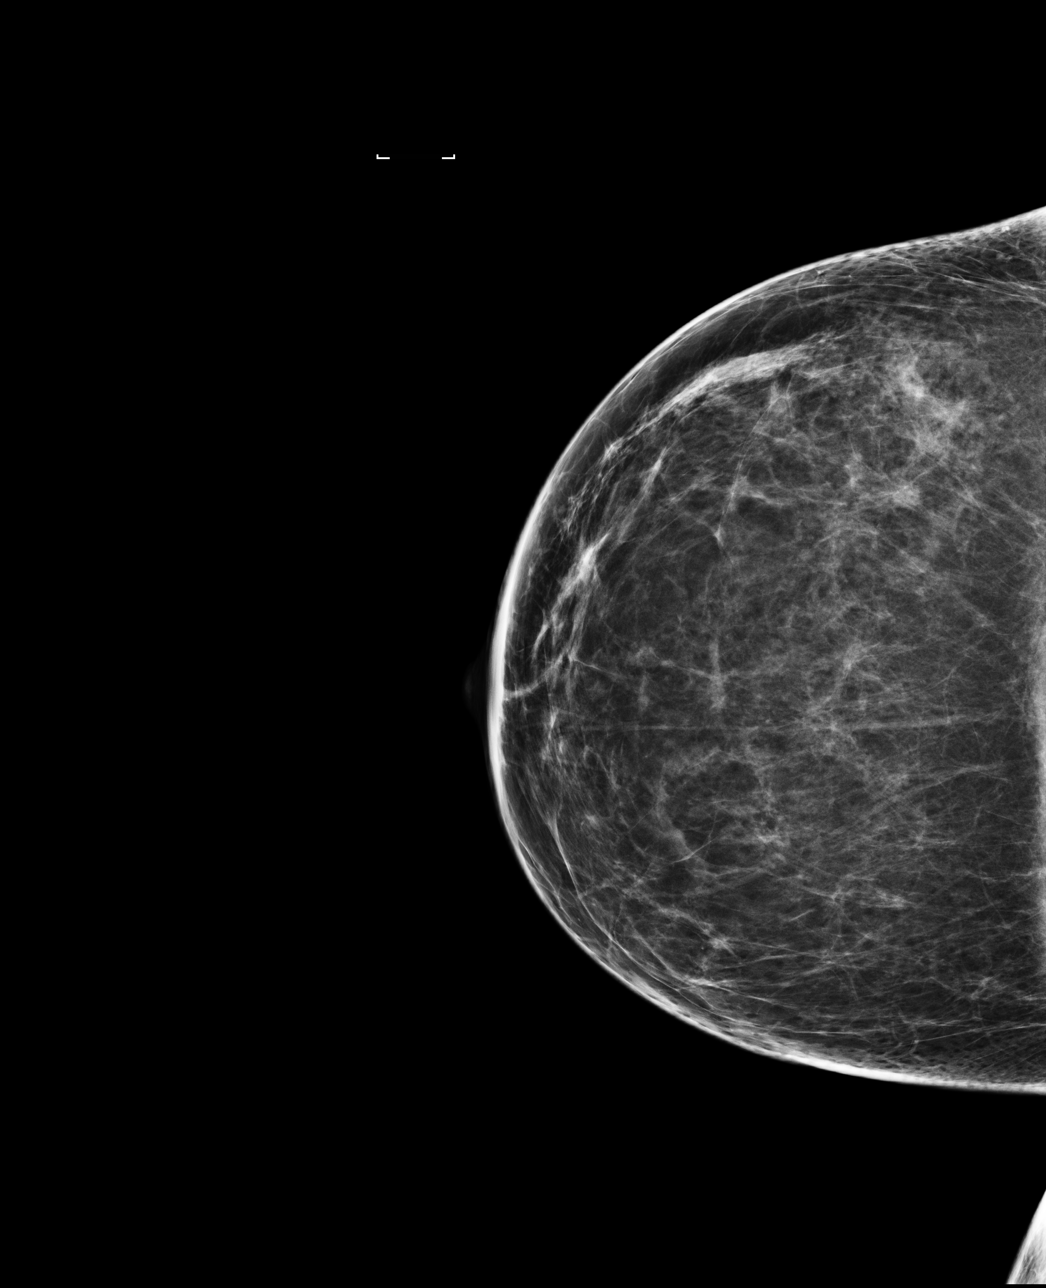

[L MLO]
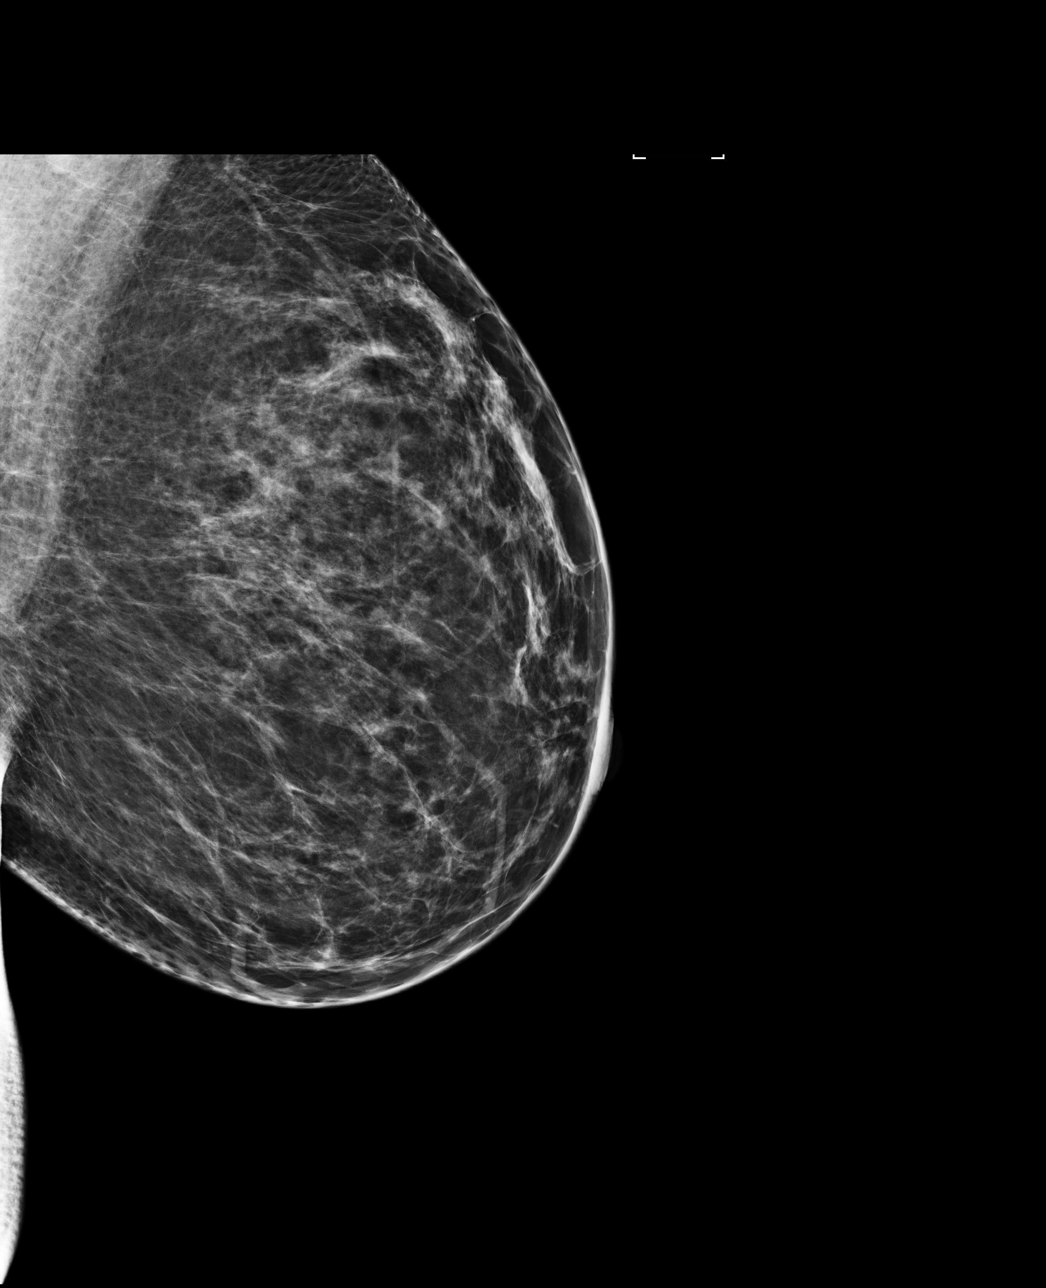

[L CC]
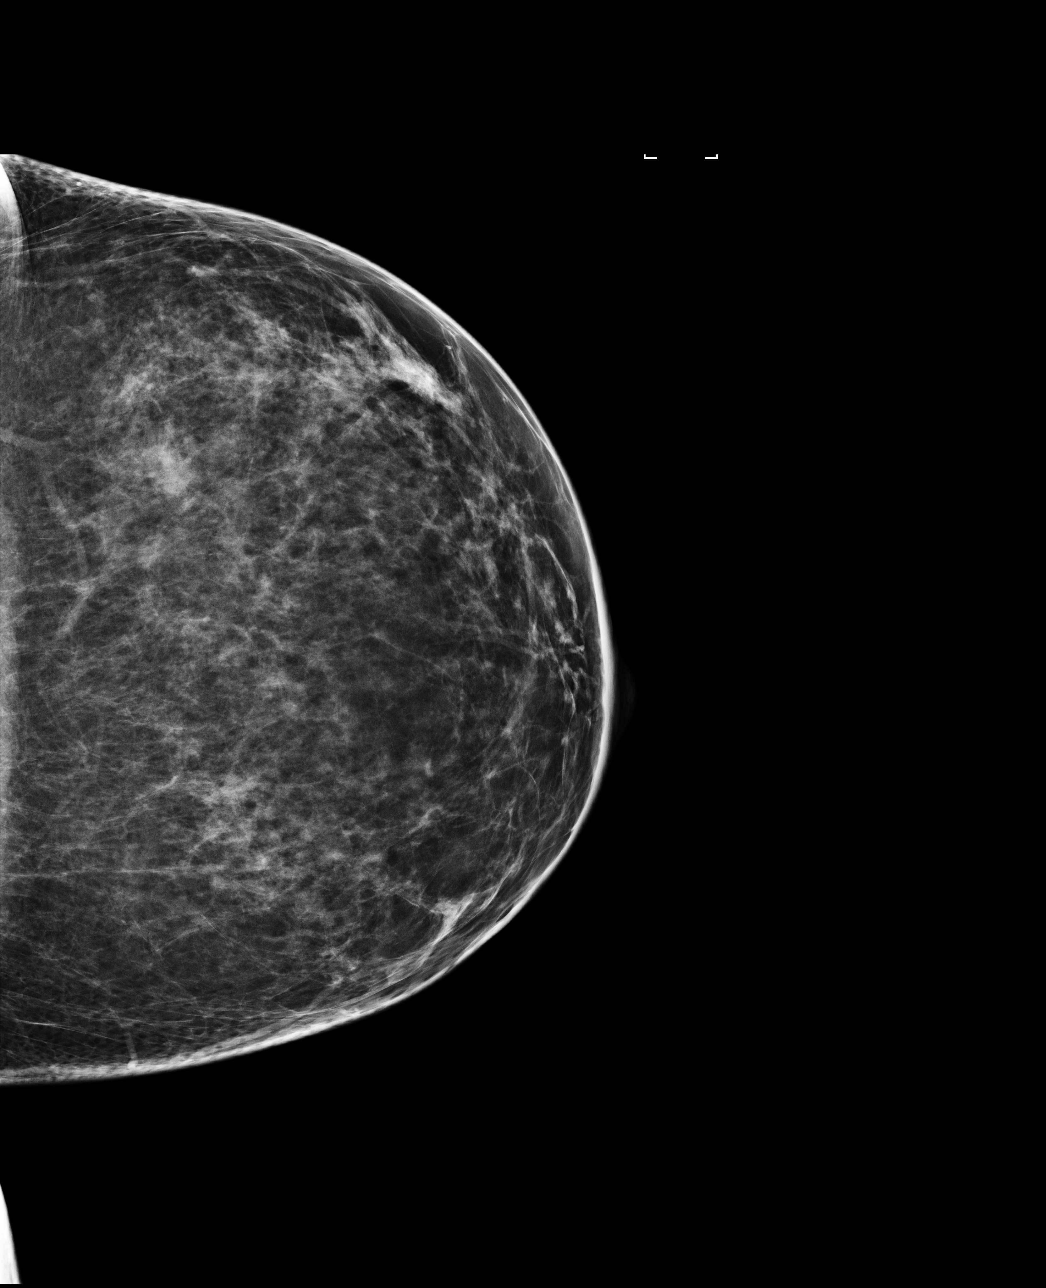

[4 of 4 positions shown; findings below may reference images not displayed]

ACR Breast Density Category b: There are scattered areas of
fibroglandular density.
FINDINGS: There are no findings suspicious for malignancy. Images were
processed with CAD.
IMPRESSION: No mammographic evidence of malignancy. A result letter of this
screening mammogram will be mailed directly to the patient.

RECOMMENDATION:
Screening mammogram in one year. (Code:[id number])

BI-RADS CATEGORY  1: Negative.

## 2021-01-16 ENCOUNTER — Other Ambulatory Visit: Payer: Self-pay

## 2021-01-16 ENCOUNTER — Encounter: Payer: Self-pay | Admitting: Internal Medicine

## 2021-01-16 ENCOUNTER — Ambulatory Visit (INDEPENDENT_AMBULATORY_CARE_PROVIDER_SITE_OTHER): Payer: Managed Care, Other (non HMO) | Admitting: Internal Medicine

## 2021-01-16 VITALS — BP 118/68 | HR 46 | Temp 98.2°F | Resp 18 | Ht 68.0 in | Wt 184.8 lb

## 2021-01-16 DIAGNOSIS — Z0000 Encounter for general adult medical examination without abnormal findings: Secondary | ICD-10-CM

## 2021-01-16 DIAGNOSIS — N870 Mild cervical dysplasia: Secondary | ICD-10-CM

## 2021-01-16 DIAGNOSIS — I341 Nonrheumatic mitral (valve) prolapse: Secondary | ICD-10-CM

## 2021-01-16 NOTE — Patient Instructions (Addendum)
Let us know if you need the labs.

## 2021-01-16 NOTE — Progress Notes (Signed)
   Subjective:   Patient ID: Caroline Hayes, female    DOB: Jul 01, 1975, 45 y.o.   MRN: [id number]  HPI The patient is a 45 YO female coming in new for physical. No concerns.  PMH, [facility], social history reviewed and updated  Review of Systems  Constitutional: Negative.   HENT: Negative.    Eyes: Negative.   Respiratory:  Negative for cough, chest tightness and shortness of breath.   Cardiovascular:  Negative for chest pain, palpitations and leg swelling.  Gastrointestinal:  Negative for abdominal distention, abdominal pain, constipation, diarrhea, nausea and vomiting.  Musculoskeletal:  Positive for myalgias.  Skin: Negative.   Neurological: Negative.   Psychiatric/Behavioral: Negative.     Objective:  Physical Exam Constitutional:      Appearance: She is well-developed.  HENT:     Head: Normocephalic and atraumatic.  Cardiovascular:     Rate and Rhythm: Normal rate and regular rhythm.     Heart sounds: Murmur heard.     Comments: Systolic murmur sounds consistent with known MVP Pulmonary:     Effort: Pulmonary effort is normal. No respiratory distress.     Breath sounds: Normal breath sounds. No wheezing or rales.  Abdominal:     General: Bowel sounds are normal. There is no distension.     Palpations: Abdomen is soft.     Tenderness: There is no abdominal tenderness. There is no rebound.  Musculoskeletal:     Cervical back: Normal range of motion.  Skin:    General: Skin is warm and dry.  Neurological:     Mental Status: She is alert and oriented to person, place, and time.     Coordination: Coordination normal.    Vitals:   01/16/21 0856  BP: 118/68  Pulse: (!) 46  Resp: 18  Temp: 98.2 F (36.8 C)  TempSrc: Oral  SpO2: 96%  Weight: 184 lb 12.8 oz (83.8 kg)  Height: '5\' 8"'$  (1.727 m)    This visit occurred during the SARS-CoV-2 public health emergency.  Safety protocols were in place, including screening questions prior to the visit, additional usage of  staff PPE, and extensive cleaning of exam room while observing appropriate contact time as indicated for disinfecting solutions.   Assessment & Plan:

## 2021-01-16 NOTE — Assessment & Plan Note (Signed)
Flu shot yearly. Covid-19 2 shots and booster. Tetanus up to date. Colonoscopy due 60. Mammogram up to date with gyn, pap smear up to date with gyn. Counseled about sun safety and mole surveillance. Counseled about the dangers of distracted driving. Given 10 year screening recommendations.

## 2021-01-16 NOTE — Assessment & Plan Note (Signed)
Gets pap with gyn and due 2023.

## 2021-01-16 NOTE — Assessment & Plan Note (Signed)
Discussed current recommendation against antibiotics prior to dental work. She understood and does not take antibiotics prior to dental procedure.

## 2021-02-07 ENCOUNTER — Ambulatory Visit: Payer: 59 | Admitting: Adult Health

## 2021-05-25 ENCOUNTER — Encounter: Payer: Self-pay | Admitting: Adult Health

## 2021-05-25 ENCOUNTER — Other Ambulatory Visit: Payer: Self-pay

## 2021-05-25 ENCOUNTER — Ambulatory Visit (INDEPENDENT_AMBULATORY_CARE_PROVIDER_SITE_OTHER): Payer: Self-pay | Admitting: Adult Health

## 2021-05-25 DIAGNOSIS — F900 Attention-deficit hyperactivity disorder, predominantly inattentive type: Secondary | ICD-10-CM

## 2021-05-25 NOTE — Progress Notes (Signed)
[place] [id number] [birth year] 45 y.o.  Subjective:   Patient ID:  Caroline Hayes is a 45 y.o. (DOB October 16, 1975) female.  Chief Complaint: No chief complaint on file.   HPI Caroline Hayes presents to the office today for follow-up of ADHD.  Describes mood today as "ok". Pleasant. Mood symptoms - denies depression and irritability. Has felt some anxiety with leaving previous job and starting a new job. Stating "I'm doing ok". Has reduced Ritalin to 1/4 of a tablet every morning and feels it works well for her. Has left previous employer and started a new job. She and partner doing well. Stable interest and motivation. Taking medications as prescribed.  Energy levels stable. Active, has a regular exercise routine - 3 days a week. Enjoys some usual interests and activities. Lives with partner Caroline Hayes) of 20 years. No children. No pets. Family in [place].  Appetite adequate. Weight stable - 180 pounds. Sleeps well most nights. Averages 8 hours. Focus and concentration improved. Completing tasks. Managing aspects of household. 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  Denies SI or HI.  Denies AH or VH. Therapist - Laural Benes   Previous medication trials: Adderall, Arville Care   PHQ2-9    [place] [facility] Visit from 01/16/2021 in [place] at [facility] Total Score 0        Review of Systems:  Review of Systems  Musculoskeletal:  Negative for gait problem.  Neurological:  Negative for tremors.  Psychiatric/Behavioral:         Please refer to HPI   Medications: I have reviewed the patient's current medications.  Current Outpatient Medications  Medication Sig Dispense Refill   Cholecalciferol (VITAMIN D3) 125 MCG (5000 UT) CAPS Take 1 capsule by mouth daily.     loratadine (CLARITIN) 10 MG tablet Take 10 mg by mouth daily as needed for allergies.     metroNIDAZOLE-Cleanser 0.75 % (Cream) KIT Apply topically.      omeprazole (PRILOSEC OTC) 20 MG tablet Take 20 mg by mouth daily as needed (for acid reflux/indigestion.).     No current facility-administered medications for this visit.    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   Please see review of systems for further details on the patient's review from today.   Objective:   Physical Exam:  LMP 03/18/2017 (Exact Date)   Physical Exam Constitutional:      General: She is not in acute distress. Musculoskeletal:        General: No  deformity.  Neurological:     Mental Status: She is alert and oriented to person, place, and time.     Coordination: Coordination normal.  Psychiatric:        Attention and Perception: Attention and perception normal. She does not perceive auditory or visual hallucinations.        Mood and Affect: Mood normal. Mood is not anxious or depressed. Affect is not labile, blunt, angry or inappropriate.        Speech: Speech normal.        Behavior: Behavior normal.        Thought Content: Thought content normal. Thought content is not paranoid or delusional. Thought content does not include homicidal or suicidal ideation. Thought content does not  include homicidal or suicidal plan.        Cognition and Memory: Cognition and memory normal.        Judgment: Judgment normal.     Comments: Insight intact    Lab Review:     Component Value Date/Time   NA 141 06/08/2020 0912   K 4.3 06/08/2020 0912   CL 107 (H) 06/08/2020 0912   CO2 22 06/08/2020 0912   GLUCOSE 89 06/08/2020 0912   GLUCOSE 78 01/17/2012 1704   BUN 8 06/08/2020 0912   CREATININE 0.76 06/08/2020 0912   CALCIUM 8.7 06/08/2020 0912   PROT 6.5 06/08/2020 0912   ALBUMIN 4.2 06/08/2020 0912   AST 17 06/08/2020 0912   ALT 28 06/08/2020 0912   ALKPHOS 82 06/08/2020 0912   BILITOT 0.8 06/08/2020 0912   GFRNONAA 97 06/08/2020 0912   GFRAA 112 06/08/2020 0912       Component Value Date/Time   WBC 5.4 06/08/2020 0912   WBC 6.0 07/02/2017 0848   RBC 4.28 06/08/2020 0912   RBC 4.74 07/02/2017 0848   HGB 13.2 06/08/2020 0912   HGB 14.6 11/24/2014 1031   HCT 39.0 06/08/2020 0912   PLT 186 06/08/2020 0912   MCV 91 06/08/2020 0912   MCH 30.8 06/08/2020 0912   MCH 30.8 07/02/2017 0848   MCHC 33.8 06/08/2020 0912   MCHC 34.4 07/02/2017 0848   RDW 12.4 06/08/2020 0912   LYMPHSABS 1.6 09/02/2013 1026   MONOABS 0.4 09/02/2013 1026   EOSABS 0.4 09/02/2013 1026   BASOSABS 0.0 09/02/2013 1026    No results found for: POCLITH, LITHIUM   No results found for: PHENYTOIN, PHENOBARB, VALPROATE, CBMZ   .res Assessment: Plan:    Plan:  PDMP reviewed  Continue Ritalin 91m - taking 1/4 tablet every morning. No medications needed today - will call in January for new script.  Patient to monitor BP between visits.  RTC 6 months   Time spent with patient was 15 minutes. Greater than 50% of face to face time with patient was spent on counseling and coordination of care.   Patient advised to contact office with any questions, adverse effects, or acute worsening in signs and symptoms.   Discussed potential benefits, risks, and side effects of stimulants with patient  to include increased heart rate, palpitations, insomnia, increased anxiety, increased irritability, or decreased appetite.  Instructed patient to contact office if experiencing any significant tolerability issues.  Diagnoses and all orders for this visit:  Attention deficit hyperactivity disorder (ADHD), predominantly inattentive type    Please see After Visit Summary for patient specific instructions.  Future Appointments  Date Time Provider [place] 06/09/2021  9:30 AM ANunzio Cobbs MD [facility] None  11/30/2021  11:20 AM Mozingo, Berdie Ogren, NP [facility] None    No orders of the defined types were placed in this encounter.   -------------------------------

## 2021-06-08 ENCOUNTER — Ambulatory Visit: Payer: Self-pay | Admitting: Obstetrics and Gynecology

## 2021-06-08 ENCOUNTER — Ambulatory Visit: Payer: Managed Care, Other (non HMO) | Admitting: Obstetrics and Gynecology

## 2021-06-09 ENCOUNTER — Ambulatory Visit (INDEPENDENT_AMBULATORY_CARE_PROVIDER_SITE_OTHER): Payer: BLUE CROSS/BLUE SHIELD | Admitting: Obstetrics and Gynecology

## 2021-06-09 ENCOUNTER — Other Ambulatory Visit (HOSPITAL_COMMUNITY)
Admission: RE | Admit: 2021-06-09 | Discharge: 2021-06-09 | Disposition: A | Discharge status: Home / Self Care | Payer: BC Managed Care – PPO | Source: Ambulatory Visit | Attending: Obstetrics and Gynecology | Admitting: Obstetrics and Gynecology

## 2021-06-09 ENCOUNTER — Encounter: Payer: Self-pay | Admitting: Obstetrics and Gynecology

## 2021-06-09 ENCOUNTER — Other Ambulatory Visit: Payer: Self-pay

## 2021-06-09 VITALS — BP 136/70 | HR 61 | Ht 67.5 in | Wt 188.0 lb

## 2021-06-09 DIAGNOSIS — Z01419 Encounter for gynecological examination (general) (routine) without abnormal findings: Secondary | ICD-10-CM | POA: Diagnosis not present

## 2021-06-09 DIAGNOSIS — Z87410 Personal history of cervical dysplasia: Secondary | ICD-10-CM | POA: Insufficient documentation

## 2021-06-09 NOTE — Progress Notes (Signed)
45 y.o. G0P0 Domestic Partner Caucasian female here for annual exam.    New job.  Working virtually.   Some discomfort at the vaginal cuff with intercourse.   Some constipation.   PCP:   Pricilla Holm, MD  Patient's last menstrual period was 03/18/2017 (exact date).           Sexually active: Yes.    The current method of family planning is status post hysterectomy.    Exercising: Yes.     Body pump or HIT Smoker:  no  Health Maintenance: Pap:  06-01-19 ASCUS:Neg HR HPV, 11-28-16 Neg:Neg HR HPV, 11-24-14 Neg:Neg HR HPV History of abnormal Pap:  yes,  LGSIL:Pos HR HPV with several colpo's 2003 for CIN II & 2009?--negative.  Cervix benign at time of hysterectomy.  MMG:  11-08-20 Neg/Birads1 Colonoscopy:  06/2019 normal other than hemorrhoids--due to bleeding BMD:   n/a  Result  n/a TDaP:  10-14-17 Gardasil:   no HIV: Neg per patient Hep C: Neg per patient Screening Labs   PCP.  Flu vaccine and Covid booster completed.    reports that she has never smoked. She has never used smokeless tobacco. She reports current alcohol use of about 1.0 standard drink per week. She reports that she does not use drugs.  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   MVP (mitral valve prolapse)    Rubella immune 2010    Past Surgical History:  Procedure Laterality Date   CYSTOSCOPY N/A 07/09/2017   Procedure: CYSTOSCOPY;  Surgeon: Megan Salon, MD;  Location: [facility];  Service: Gynecology;  Laterality: N/A;   IUD REMOVAL N/A 07/09/2017   Procedure: INTRAUTERINE DEVICE (IUD) REMOVAL;  Surgeon: Megan Salon, MD;  Location: [facility];  Service: Gynecology;   Laterality: N/A;   ROOT CANAL  07/2014   TOTAL LAPAROSCOPIC HYSTERECTOMY WITH SALPINGECTOMY Bilateral 07/09/2017   Procedure: TOTAL LAPAROSCOPIC HYSTERECTOMY WITH SALPINGECTOMY;  Surgeon: Megan Salon, MD;  Location: [facility];  Service: Gynecology;  Laterality: Bilateral;   WISDOM TOOTH EXTRACTION  1999    Current Outpatient Medications  Medication Sig Dispense Refill   Cholecalciferol (VITAMIN D3) 125 MCG (5000 UT) CAPS Take 1 capsule by mouth daily.     loratadine (CLARITIN) 10 MG tablet Take 10 mg by mouth daily as needed for allergies.     methylphenidate (RITALIN) 10 MG tablet Take 10 mg by mouth as needed.     metroNIDAZOLE-Cleanser 0.75 % (Cream) KIT Apply topically.     omeprazole (PRILOSEC OTC) 20 MG tablet Take 20 mg by mouth daily as needed (for acid reflux/indigestion.).     No current facility-administered medications for this visit.    Family History  Problem Relation Age of Onset   Hypertension Mother    Anemia Mother    Hypertension Maternal Grandmother    Cancer Sister    Heart attack Paternal Grandfather    Basal cell carcinoma Father    Hypertension Father    Cancer Maternal Grandfather  prostate in his 7's   Breast cancer Maternal Aunt 51       x2 recurence age 47    Review of Systems  Gastrointestinal:  Positive for constipation.  All other systems reviewed and are negative.  Exam:   BP 136/70    Pulse 61    Ht 5' 7.5" (1.715 m)    Wt 188 lb (85.3 kg)    LMP 03/18/2017 (Exact Date)    SpO2 99%    BMI 29.01 kg/m     General appearance: alert, cooperative and appears stated age Head: normocephalic, without obvious abnormality, atraumatic Neck: no adenopathy, supple, symmetrical, trachea midline and thyroid normal to inspection and palpation Lungs: clear to auscultation bilaterally Breasts: normal appearance, no masses or tenderness, No nipple retraction or dimpling, No nipple discharge or bleeding, No axillary adenopathy Heart: regular rate and  rhythm Abdomen: soft, non-tender; no masses, no organomegaly Extremities: extremities normal, atraumatic, no cyanosis or edema Skin: skin color, texture, turgor normal. No rashes or lesions Lymph nodes: cervical, supraclavicular, and axillary nodes normal. Neurologic: grossly normal  Pelvic: External genitalia:  no lesions              No abnormal inguinal nodes palpated.              Urethra:  normal appearing urethra with no masses, tenderness or lesions              Bartholins and Skenes: normal                 Vagina: normal appearing vagina with normal color and discharge, no lesions              Cervix: absent              Pap taken: yes. Bimanual Exam:  Uterus:  absent              Adnexa: no mass, fullness, tenderness              Rectal exam: yes.  Confirms.              Anus:  normal sphincter tone, no lesions  Chaperone was present for exam:  Estill Bamberg, CMA  Assessment:   Well woman visit with gynecologic exam. Status post laparoscopic hysterectomy with bilateral salpingectomy for dysmenorrhea and fibroid, Jan 2019.  Post op vaginal cuff hematoma.   Normal cervical cytology on hysterectomy specimen.  Hx prior CIN II. ASCUS pap with neg HR HPV in 2021.  Elevated cholesterol ratios.   Plan: Mammogram screening discussed. Self breast awareness reviewed. Pap and HR HPV collected.  Guidelines for Calcium, Vitamin D, regular exercise program including cardiovascular and weight bearing exercise. We discussed potential vaginal estrogen use. We reviewed potential effect on breast cancer.  Labs with PCP. Follow up annually and prn.   After visit summary provided.

## 2021-06-09 NOTE — Patient Instructions (Signed)

## 2021-06-15 LAB — CYTOLOGY - PAP
Comment: NEGATIVE
Diagnosis: NEGATIVE
High risk HPV: NEGATIVE

## 2021-08-03 ENCOUNTER — Other Ambulatory Visit: Payer: Self-pay

## 2021-08-03 ENCOUNTER — Encounter: Payer: Self-pay | Admitting: Internal Medicine

## 2021-08-03 ENCOUNTER — Ambulatory Visit (INDEPENDENT_AMBULATORY_CARE_PROVIDER_SITE_OTHER): Payer: BC Managed Care – PPO | Admitting: Internal Medicine

## 2021-08-03 DIAGNOSIS — L853 Xerosis cutis: Secondary | ICD-10-CM

## 2021-08-03 DIAGNOSIS — L219 Seborrheic dermatitis, unspecified: Secondary | ICD-10-CM | POA: Diagnosis not present

## 2021-08-03 DIAGNOSIS — L719 Rosacea, unspecified: Secondary | ICD-10-CM

## 2021-08-03 MED ORDER — METRONIDAZOLE 0.75 % EX CREA: TOPICAL_CREAM | Freq: Two times a day (BID) | CUTANEOUS | 1 refills | Status: DC

## 2021-08-03 MED ORDER — HYDROCORTISONE 1 % EX CREA: 1.0000 "application " | TOPICAL_CREAM | Freq: Two times a day (BID) | CUTANEOUS | 0 refills | Status: DC

## 2021-08-03 NOTE — Assessment & Plan Note (Addendum)
New.  Use Hydrocort cream 1%  bid over flaky skin on the face as needed

## 2021-08-03 NOTE — Assessment & Plan Note (Addendum)
Worse.  Use Aquaphor oint prn Short showers, cooler water

## 2021-08-03 NOTE — Assessment & Plan Note (Addendum)
Worse.  Use metro cream twice a day

## 2021-08-03 NOTE — Progress Notes (Signed)
Subjective:  Patient ID: Caroline Hayes, female    DOB: 12/25/1975  Age: 46 y.o. MRN: [id number]  CC: No chief complaint on file.   HPI [facility] presents for facial redness, flushed sensation, flaky skin on the eyebrows C/o dry skin, itching all over the body periodically   Outpatient Medications Prior to Visit  Medication Sig Dispense Refill   amphetamine-dextroamphetamine (ADDERALL XR) 5 MG 24 hr capsule (Schedule II Drug) TK 2 CS PO ON MONDAY AND 1 C PO Q MORNING ALL OTHER DAYS     Cholecalciferol (VITAMIN D3) 125 MCG (5000 UT) CAPS Take 1 capsule by mouth daily.     influenza vac split quadrivalent PF (FLUZONE QUADRIVALENT) 0.5 ML injection ADM 0.5ML IM UTD     loratadine (CLARITIN) 10 MG tablet Take 10 mg by mouth daily as needed for allergies.     methylphenidate (RITALIN) 10 MG tablet Take 10 mg by mouth as needed.     omeprazole (PRILOSEC OTC) 20 MG tablet Take 20 mg by mouth daily as needed (for acid reflux/indigestion.).     metroNIDAZOLE-Cleanser 0.75 % (Cream) KIT Apply topically.     No facility-administered medications prior to visit.    ROS: Review of Systems  Constitutional:  Negative for activity change, appetite change, chills, fatigue and unexpected weight change.  HENT:  Negative for congestion, mouth sores and sinus pressure.   Eyes:  Negative for visual disturbance.  Respiratory:  Negative for cough and chest tightness.   Gastrointestinal:  Negative for abdominal pain and nausea.  Genitourinary:  Negative for difficulty urinating, frequency and vaginal pain.  Musculoskeletal:  Positive for arthralgias, gait problem, neck pain and neck stiffness. Negative for back pain.  Skin:  Negative for pallor and rash.  Neurological:  Positive for headaches. Negative for dizziness, tremors, weakness and numbness.  Psychiatric/Behavioral:  Negative for confusion, sleep disturbance and suicidal ideas. The patient is nervous/anxious.    Objective:  BP  118/70 (BP Location: Left Arm, Patient Position: Sitting, Cuff Size: Large)    Pulse 60    Temp 98.3 F (36.8 C) (Oral)    Ht 5' 7.5" (1.715 m)    Wt 187 lb (84.8 kg)    LMP 03/18/2017 (Exact Date)    SpO2 98%    BMI 28.86 kg/m   BP Readings from Last 3 Encounters:  08/03/21 118/70  06/09/21 136/70  01/16/21 118/68    Wt Readings from Last 3 Encounters:  08/03/21 187 lb (84.8 kg)  06/09/21 188 lb (85.3 kg)  01/16/21 184 lb 12.8 oz (83.8 kg)    Physical Exam Constitutional:      General: She is not in acute distress.    Appearance: She is well-developed.  HENT:     Head: Normocephalic.     Right Ear: External ear normal.     Left Ear: External ear normal.     Nose: Nose normal.  Eyes:     General:        Right eye: No discharge.        Left eye: No discharge.     Conjunctiva/sclera: Conjunctivae normal.     Pupils: Pupils are equal, round, and reactive to light.  Neck:     Thyroid: No thyromegaly.     Vascular: No JVD.     Trachea: No tracheal deviation.  Cardiovascular:     Rate and Rhythm: Normal rate and regular rhythm.     Heart sounds: Normal heart sounds.  Pulmonary:  Effort: No respiratory distress.     Breath sounds: No stridor. No wheezing.  Abdominal:     General: Bowel sounds are normal. There is no distension.     Palpations: Abdomen is soft. There is no mass.     Tenderness: There is no abdominal tenderness. There is no guarding or rebound.  Musculoskeletal:        General: No tenderness.     Cervical back: Normal range of motion and neck supple. No rigidity.  Lymphadenopathy:     Cervical: No cervical adenopathy.  Skin:    Findings: Erythema present. No rash.  Neurological:     Cranial Nerves: No cranial nerve deficit.     Motor: No abnormal muscle tone.     Coordination: Coordination normal.     Deep Tendon Reflexes: Reflexes normal.  Psychiatric:        Behavior: Behavior normal.        Thought Content: Thought content normal.         Judgment: Judgment normal.   Rosacea and seborrheic dermatitis changes on the face Dry skin on lower extremities and back  Lab Results  Component Value Date   WBC 5.4 06/08/2020   HGB 13.2 06/08/2020   HCT 39.0 06/08/2020   PLT 186 06/08/2020   GLUCOSE 89 06/08/2020   CHOL 205 (H) 06/08/2020   TRIG 139 06/08/2020   HDL 45 06/08/2020   LDLCALC 135 (H) 06/08/2020   ALT 28 06/08/2020   AST 17 06/08/2020   NA 141 06/08/2020   K 4.3 06/08/2020   CL 107 (H) 06/08/2020   CREATININE 0.76 06/08/2020   BUN 8 06/08/2020   CO2 22 06/08/2020   TSH 1.600 06/01/2019    No results found.  Assessment & Plan:   Problem List Items Addressed This Visit     Dry skin dermatitis    Worse.  Use Aquaphor oint prn Short showers, cooler water      Rosacea    Worse.  Use metro cream twice a day      Seborrheic dermatitis    New.  Use Hydrocort cream 1%  bid over flaky skin on the face as needed         Meds ordered this encounter  Medications   metroNIDAZOLE (METROCREAM) 0.75 % cream    Sig: Apply topically 2 (two) times daily.    Dispense:  45 g    Refill:  1   hydrocortisone cream 1 %    Sig: Apply 1 application topically 2 (two) times daily.    Dispense:  30 g    Refill:  0      Follow-up: Return for a follow-up visit.  Walker Kehr, MD

## 2021-09-25 ENCOUNTER — Other Ambulatory Visit: Payer: Self-pay | Admitting: Obstetrics and Gynecology

## 2021-09-25 DIAGNOSIS — Z1231 Encounter for screening mammogram for malignant neoplasm of breast: Secondary | ICD-10-CM

## 2021-10-03 DIAGNOSIS — H16223 Keratoconjunctivitis sicca, not specified as Sjogren's, bilateral: Secondary | ICD-10-CM | POA: Diagnosis not present

## 2021-10-03 DIAGNOSIS — H02824 Cysts of left upper eyelid: Secondary | ICD-10-CM | POA: Diagnosis not present

## 2021-10-03 DIAGNOSIS — H1011 Acute atopic conjunctivitis, right eye: Secondary | ICD-10-CM | POA: Diagnosis not present

## 2021-11-13 ENCOUNTER — Ambulatory Visit
Admission: RE | Admit: 2021-11-13 | Discharge: 2021-11-13 | Disposition: A | Discharge status: Home / Self Care | Payer: BC Managed Care – PPO | Source: Ambulatory Visit | Attending: Obstetrics and Gynecology | Admitting: Obstetrics and Gynecology

## 2021-11-13 DIAGNOSIS — Z1231 Encounter for screening mammogram for malignant neoplasm of breast: Secondary | ICD-10-CM

## 2021-11-14 ENCOUNTER — Telehealth: Payer: Self-pay | Admitting: Obstetrics and Gynecology

## 2021-11-14 NOTE — Telephone Encounter (Signed)
Please contact patient in follow up to request [facility] for the patient to have diagnostic breast imaging.   If the patient is having pain or a breast problem, please have her make an appointment with me.  Her last office visit was 06/09/21 for an annual exam.

## 2021-11-15 NOTE — Telephone Encounter (Signed)
Breast exam schedueld 11/17/21 at 2pm.

## 2021-11-15 NOTE — Telephone Encounter (Signed)
Encounter reviewed and closed.  

## 2021-11-15 NOTE — Telephone Encounter (Signed)
I spoke with patient. She is having breast tenderness in one quadrant of her breast x 3 weeks.  I advised Dr. Quincy Simmonds would like to see her for breast exam prior to ordering diagnostic imaging and she was agreeable.  Message sent to appointment desk to schedule patient as soon as possible for breast exam.

## 2021-11-16 ENCOUNTER — Encounter: Payer: Self-pay | Admitting: Obstetrics and Gynecology

## 2021-11-16 ENCOUNTER — Ambulatory Visit (INDEPENDENT_AMBULATORY_CARE_PROVIDER_SITE_OTHER): Payer: BC Managed Care – PPO | Admitting: Obstetrics and Gynecology

## 2021-11-16 ENCOUNTER — Telehealth: Payer: Self-pay | Admitting: Obstetrics and Gynecology

## 2021-11-16 VITALS — BP 104/70 | HR 51 | Ht 67.5 in | Wt 188.0 lb

## 2021-11-16 DIAGNOSIS — N644 Mastodynia: Secondary | ICD-10-CM

## 2021-11-16 NOTE — Progress Notes (Signed)
GYNECOLOGY  VISIT   HPI: 46 y.o.   Domestic Partner  Caucasian  female   G0P0 with Patient's last menstrual period was 03/18/2017 (exact date).   here for right breast pain x72month This is near 4:00/5:00 position.  Feels like a bruise and occurring for one month.  No trauma.  Doing some increased exercise.   No hormonal therapy.   No heat to the area.  No lumps.   Maternal aunt with breast cancer x 2.   GYNECOLOGIC HISTORY: Patient's last menstrual period was 03/18/2017 (exact date). Contraception:  Hyst Menopausal hormone therapy:  none Last mammogram:  11-08-20 Neg/Birads1 Last pap smear:  06-09-21 ASCUS:Neg HR HPV, 06-01-19 ASCUS:Neg HR HPV, 11-28-16 Neg:Neg HR HPV        OB History     Gravida  0   Para      Term      Preterm      AB      Living         SAB      IAB      Ectopic      Multiple      Live Births                 Patient Active Problem List   Diagnosis Date Noted   Rosacea 08/03/2021   Seborrheic dermatitis 08/03/2021   Dry skin dermatitis 08/03/2021   Routine general medical examination at a health care facility 01/16/2021   MVP (mitral valve prolapse) 01/17/2012   Dysplasia of cervix, 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   MVP (mitral valve prolapse)    Rubella immune 2010    Past Surgical History:  Procedure Laterality Date   CYSTOSCOPY N/A 07/09/2017   Procedure: CYSTOSCOPY;  Surgeon: MMegan Salon MD;  [facility];  Service: Gynecology;  Laterality: N/A;   IUD REMOVAL N/A 07/09/2017   Procedure: INTRAUTERINE DEVICE (IUD) REMOVAL;   Surgeon: MMegan Salon MD;  [facility];  Service: Gynecology;  Laterality: N/A;   ROOT CANAL  07/2014   TOTAL LAPAROSCOPIC HYSTERECTOMY WITH SALPINGECTOMY Bilateral 07/09/2017   Procedure: TOTAL LAPAROSCOPIC HYSTERECTOMY WITH SALPINGECTOMY;  Surgeon: MMegan Salon MD;  [facility];  Service: Gynecology;  Laterality: Bilateral;   WISDOM TOOTH EXTRACTION  1999    Current Outpatient Medications  Medication Sig Dispense Refill   amphetamine-dextroamphetamine (ADDERALL XR) 5 MG 24 hr capsule (Schedule II Drug) TK 2 CS PO ON MONDAY AND 1 C PO Q MORNING ALL OTHER DAYS     Cholecalciferol (VITAMIN D3) 125 MCG (5000 UT) CAPS Take 1 capsule by mouth daily.     hydrocortisone cream 1 % Apply 1 application topically 2 (two) times daily. 30 g 0   influenza vac split quadrivalent PF (FLUZONE QUADRIVALENT) 0.5 ML injection ADM 0.5ML IM UTD     loratadine (CLARITIN) 10 MG tablet Take 10 mg by mouth daily as needed for allergies.     methylphenidate (  RITALIN) 10 MG tablet Take 10 mg by mouth as needed.     metroNIDAZOLE (METROCREAM) 0.75 % cream Apply topically 2 (two) times daily. 45 g 1   omeprazole (PRILOSEC OTC) 20 MG tablet Take 20 mg by mouth daily as needed (for acid reflux/indigestion.).     No current facility-administered medications for this visit.     ALLERGIES: Barley green, Eggs or egg-derived products, Ginger, Imitrex [sumatriptan base], Soy allergy, and Wool alcohol [lanolin]  Family History  Problem Relation Age of Onset   Hypertension Mother    Anemia Mother    Hypertension Maternal Grandmother    Cancer Sister    Heart attack Paternal Grandfather    Basal cell carcinoma Father    Hypertension Father    Cancer Maternal Grandfather        prostate in his 14's   Breast cancer Maternal Aunt 65       x2 recurence age 83    Social History   Socioeconomic History   Marital status: Soil scientist    Spouse name: Not on file   Number of children: Not on file    Years of education: Not on file   Highest education level: Not on file  Occupational History   Not on file  Tobacco Use   Smoking status: Never   Smokeless tobacco: Never  Vaping Use   Vaping Use: Never used  Substance and Sexual Activity   Alcohol use: Yes    Alcohol/week: 1.0 standard drink    Types: 1 Glasses of wine per week   Drug use: No   Sexual activity: Yes    Partners: Male    Birth control/protection: Surgical    Comment: Hyst  Other Topics Concern   Not on file  Social History Narrative   Not on file   Social Determinants of Health   Financial Resource Strain: Not on file  Food Insecurity: Not on file  Transportation Needs: Not on file  Physical Activity: Not on file  Stress: Not on file  Social Connections: Not on file  Intimate Partner Violence: Not on file    Review of Systems  Musculoskeletal:        Right breast pain x1 month  All other systems reviewed and are negative.  PHYSICAL EXAMINATION:    BP 104/70   Pulse (!) 51   Ht 5' 7.5" (1.715 m)   Wt 188 lb (85.3 kg)   LMP 03/18/2017 (Exact Date)   SpO2 97%   BMI 29.01 kg/m     General appearance: alert, cooperative and appears stated age Lungs: clear to auscultation bilaterally Breasts:left - normal appearance, no masses or tenderness, No nipple retraction or dimpling, No nipple discharge or bleeding, No axillary or supraclavicular adenopathy Right - normal appearance, no masses, tenderness present at 4 - 5:00, No nipple retraction or dimpling, No nipple discharge or bleeding, No axillary or supraclavicular adenopathy Heart: regular rate and rhythm   Chaperone was present for exam:  Estill Bamberg, CMA  ASSESSMENT  Right breast pain.   No mass.   PLAN  Will proceed forward with bilateral dx mammogram and right breast US at the [facility].    An After Visit Summary was printed and given to the patient.  20 min  total time was spent for this patient encounter, including preparation,  face-to-face counseling with the patient, coordination of care, and documentation of the encounter.

## 2021-11-16 NOTE — Telephone Encounter (Signed)
Please schedule dx bilateral mammogram and right breast ultrasound for my patient at the [facility].   She has right breast pain at 4 - 5:00.   I do not feel a mass.

## 2021-11-16 NOTE — Telephone Encounter (Signed)
FYI. Pt scheduled for their first available of 12/05/21 @ 2:40 with arrival time of 2:20. Pt notified and voiced understanding.

## 2021-11-16 NOTE — Telephone Encounter (Signed)
Encounter reviewed and closed.  

## 2021-11-28 ENCOUNTER — Ambulatory Visit
Admission: RE | Admit: 2021-11-28 | Discharge: 2021-11-28 | Disposition: A | Discharge status: Home / Self Care | Payer: BC Managed Care – PPO | Source: Ambulatory Visit | Attending: Obstetrics and Gynecology | Admitting: Obstetrics and Gynecology

## 2021-11-28 DIAGNOSIS — N644 Mastodynia: Secondary | ICD-10-CM

## 2021-11-30 ENCOUNTER — Telehealth (INDEPENDENT_AMBULATORY_CARE_PROVIDER_SITE_OTHER): Payer: BC Managed Care – PPO | Admitting: Adult Health

## 2021-11-30 ENCOUNTER — Encounter: Payer: Self-pay | Admitting: Adult Health

## 2021-11-30 DIAGNOSIS — F900 Attention-deficit hyperactivity disorder, predominantly inattentive type: Secondary | ICD-10-CM | POA: Diagnosis not present

## 2021-11-30 NOTE — Progress Notes (Signed)
Caroline Hayes [id number] 1976-06-04 46 y.o.  Virtual Visit via Video Note  I connected with pt @ on 11/30/21 at 11:20 AM EDT by a video enabled telemedicine application and verified that I am speaking with the correct person using two identifiers.   I discussed the limitations of evaluation and management by telemedicine and the availability of in person appointments. The patient expressed understanding and agreed to proceed.  I discussed the assessment and treatment plan with the patient. The patient was provided an opportunity to ask questions and all were answered. The patient agreed with the plan and demonstrated an understanding of the instructions.   The patient was advised to call back or seek an in-person evaluation if the symptoms worsen or if the condition fails to improve as anticipated.  I provided 20 minutes of non-face-to-face time during this encounter.  The patient was located at home.  The provider was located at [place].   Aloha Gell, NP   Subjective:   Patient ID:  Caroline Hayes is a 46 y.o. (DOB [birth year]) female.  Chief Complaint: No chief complaint on file.   HPI [facility] presents for follow-up of ADHD.  Describes mood today as "ok". Pleasant. Mood symptoms - denies depression, anxiety and irritability. Stating "I'm doing ok". Continues to take 1/4 tablet of a '10mg'$  Ritalin twice a week to help with "brain fog". Started a new job and has adjusted to the change. She and partner doing well. Stable interest and motivation. Taking medications as prescribed.  Energy levels stable. Active, has a regular exercise routine - 3 days a week. Enjoys some usual interests and activities. Lives with partner [name] of 20 years. No children. No pets. Family in [place].  Appetite adequate. Weight stable - 180 pounds. Sleeps well most nights. Averages 8 hours. Focus and concentration improved. Completing tasks. Managing aspects of  household. Works full time as Chief Financial Officer - remotely. Denies SI or HI.  Denies AH or VH.   Previous medication trials: Adderall, [facility]   Review of Systems:  Review of Systems  Musculoskeletal:  Negative for gait problem.  Neurological:  Negative for tremors.  Psychiatric/Behavioral:         Please refer to HPI    Medications: I have reviewed the patient's current medications.  Current Outpatient Medications  Medication Sig Dispense Refill   amphetamine-dextroamphetamine (ADDERALL XR) 5 MG 24 hr capsule (Schedule II Drug) TK 2 CS PO ON MONDAY AND 1 C PO Q MORNING ALL OTHER DAYS     Cholecalciferol (VITAMIN D3) 125 MCG (5000 UT) CAPS Take 1 capsule by mouth daily.     hydrocortisone cream 1 % Apply 1 application topically 2 (two) times daily. 30 g 0   influenza vac split quadrivalent PF (FLUZONE QUADRIVALENT) 0.5 ML injection ADM 0.5ML IM UTD     loratadine (CLARITIN) 10 MG tablet Take 10 mg by mouth daily as needed for allergies.     methylphenidate (RITALIN) 10 MG tablet Take 10 mg by mouth as needed.     metroNIDAZOLE (METROCREAM) 0.75 % cream Apply topically 2 (two) times daily. 45 g 1   omeprazole (PRILOSEC OTC) 20 MG tablet Take 20 mg by mouth daily as needed (for acid reflux/indigestion.).     No current facility-administered medications for this visit.    Medication Side Effects: None  Allergies:  Allergies  Allergen Reactions   Barley Green Itching   Eggs Or Egg-Derived Products Nausea Only   Ginger Other (See  Comments)    Nose itchy   Imitrex [Sumatriptan Base] Nausea Only and Swelling   Soy Allergy    Wool Alcohol [Lanolin] Other (See Comments)    Face swelling, skin tight    Past Medical History:  Diagnosis Date   ADHD 2020   Anxiety    Chlamydia 2000   Depression    Dyspareunia    Family history of adverse reaction to anesthesia    mother with nausea and vomiting   Fibroid    GERD (gastroesophageal reflux disease)    occasional, mild    Headache    due to bnirth control pills   Heart murmur    HPV in female 2003   CIN II with negative Colpo Biopsy   MVP (mitral valve prolapse)    Rubella immune 2010    Family History  Problem Relation Age of Onset   Hypertension Mother    Anemia Mother    Hypertension Maternal Grandmother    Cancer Sister    Heart attack Paternal Grandfather    Basal cell carcinoma Father    Hypertension Father    Cancer Maternal Grandfather        prostate in his 47's   Breast cancer Maternal Aunt 40       x2 recurence age 54    Social History   Socioeconomic History   Marital status: Soil scientist    Spouse name: Not on file   Number of children: Not on file   Years of education: Not on file   Highest education level: Not on file  Occupational History   Not on file  Tobacco Use   Smoking status: Never   Smokeless tobacco: Never  Vaping Use   Vaping Use: Never used  Substance and Sexual Activity   Alcohol use: Yes    Alcohol/week: 1.0 standard drink of alcohol    Types: 1 Glasses of wine per week   Drug use: No   Sexual activity: Yes    Partners: Male    Birth control/protection: Surgical    Comment: Hyst  Other Topics Concern   Not on file  Social History Narrative   Not on file   Social Determinants of Health   Financial Resource Strain: Not on file  Food Insecurity: Not on file  Transportation Needs: Not on file  Physical Activity: Not on file  Stress: Not on file  Social Connections: Not on file  Intimate Partner Violence: Not on file    Past Medical History, Surgical history, Social history, and Family history were reviewed and updated as appropriate.   Please see review of systems for further details on the patient's review from today.   Objective:   Physical Exam:  LMP 03/18/2017 (Exact Date)   Physical Exam Constitutional:      General: She is not in acute distress. Musculoskeletal:        General: No deformity.  Neurological:     Mental  Status: She is alert and oriented to person, place, and time.     Coordination: Coordination normal.  Psychiatric:        Attention and Perception: Attention and perception normal. She does not perceive auditory or visual hallucinations.        Mood and Affect: Mood normal. Mood is not anxious or depressed. Affect is not labile, blunt, angry or inappropriate.        Speech: Speech normal.        Behavior: Behavior normal.  Thought Content: Thought content normal. Thought content is not paranoid or delusional. Thought content does not include homicidal or suicidal ideation. Thought content does not include homicidal or suicidal plan.        Cognition and Memory: Cognition and memory normal.        Judgment: Judgment normal.     Comments: Insight intact     Lab Review:     Component Value Date/Time   NA 141 06/08/2020 0912   K 4.3 06/08/2020 0912   CL 107 (H) 06/08/2020 0912   CO2 22 06/08/2020 0912   GLUCOSE 89 06/08/2020 0912   GLUCOSE 78 01/17/2012 1704   BUN 8 06/08/2020 0912   CREATININE 0.76 06/08/2020 0912   CALCIUM 8.7 06/08/2020 0912   PROT 6.5 06/08/2020 0912   ALBUMIN 4.2 06/08/2020 0912   AST 17 06/08/2020 0912   ALT 28 06/08/2020 0912   ALKPHOS 82 06/08/2020 0912   BILITOT 0.8 06/08/2020 0912   GFRNONAA 97 06/08/2020 0912   GFRAA 112 06/08/2020 0912       Component Value Date/Time   WBC 5.4 06/08/2020 0912   WBC 6.0 07/02/2017 0848   RBC 4.28 06/08/2020 0912   RBC 4.74 07/02/2017 0848   HGB 13.2 06/08/2020 0912   HGB 14.6 11/24/2014 1031   HCT 39.0 06/08/2020 0912   PLT 186 06/08/2020 0912   MCV 91 06/08/2020 0912   MCH 30.8 06/08/2020 0912   MCH 30.8 07/02/2017 0848   MCHC 33.8 06/08/2020 0912   MCHC 34.4 07/02/2017 0848   RDW 12.4 06/08/2020 0912   LYMPHSABS 1.6 09/02/2013 1026   MONOABS 0.4 09/02/2013 1026   EOSABS 0.4 09/02/2013 1026   BASOSABS 0.0 09/02/2013 1026    No results found for: "POCLITH", "LITHIUM"   No results found for:  "PHENYTOIN", "PHENOBARB", "VALPROATE", "CBMZ"   .res Assessment: Plan:    Plan:  PDMP reviewed  Continue Ritalin '10mg'$  - taking 1/4 tablet every morning. No medications needed today - will call in January for new script.  Patient to monitor BP between visits.  RTC 6 months  Time spent with patient was 15 minutes. Greater than 50% of face to face time with patient was spent on counseling and coordination of care.   Patient advised to contact office with any questions, adverse effects, or acute worsening in signs and symptoms.   Discussed potential benefits, risks, and side effects of stimulants with patient to include increased heart rate, palpitations, insomnia, increased anxiety, increased irritability, or decreased appetite.  Instructed patient to contact office if experiencing any significant tolerability issues. There are no diagnoses linked to this encounter.   Please see After Visit Summary for patient specific instructions.  Future Appointments  Date Time Provider [place]  11/30/2021 11:20 AM Addalie Calles, Berdie Ogren, NP [facility] None  06/14/2022  8:30 AM Nunzio Cobbs, MD [facility] None    No orders of the defined types were placed in this encounter.     -------------------------------

## 2021-12-05 ENCOUNTER — Other Ambulatory Visit: Payer: BC Managed Care – PPO

## 2022-05-31 NOTE — Progress Notes (Deleted)
46 y.o. G0P0 Domestic Partner Caucasian female here for annual exam.    PCP:     Patient's last menstrual period was 03/18/2017 (exact date).           Sexually active: {yes no:314532}  The current method of family planning is status post hysterectomy.    Exercising: {yes no:314532}  {types:19826} Smoker:  {YES [id number]  Health Maintenance: Pap:  06/09/21 negative: HR HPV negative, 06/01/19 ASCUS: HR HPV negative History of abnormal Pap:  yes MMG:  11/28/21, Breast Density Category B, BI-RADS CATEGORY 1 Negative Colonoscopy:  n/a BMD:   n/a  Result  n/a TDaP:  10/14/17 Gardasil:   no HIV: neg per pt Hep C: neg per pt Screening Labs:  Hb today: ***, Urine today: ***   reports that she has never smoked. She has never used smokeless tobacco. She reports current alcohol use of about 1.0 standard drink of alcohol per week. She reports that she does not use drugs.  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   MVP (mitral valve prolapse)    Rubella immune 2010    Past Surgical History:  Procedure Laterality Date   CYSTOSCOPY N/A 07/09/2017   Procedure: CYSTOSCOPY;  Surgeon: Megan Salon, MD;  Location: [facility];  Service: Gynecology;  Laterality: N/A;   IUD REMOVAL N/A 07/09/2017   Procedure: INTRAUTERINE DEVICE (IUD) REMOVAL;  Surgeon: Megan Salon, MD;  Location: [facility];  Service: Gynecology;  Laterality: N/A;   ROOT CANAL  07/2014   TOTAL LAPAROSCOPIC HYSTERECTOMY WITH SALPINGECTOMY Bilateral 07/09/2017   Procedure: TOTAL LAPAROSCOPIC HYSTERECTOMY WITH SALPINGECTOMY;  Surgeon: Megan Salon, MD;  Location: [facility];  Service: Gynecology;   Laterality: Bilateral;   WISDOM TOOTH EXTRACTION  1999    Current Outpatient Medications  Medication Sig Dispense Refill   amphetamine-dextroamphetamine (ADDERALL XR) 5 MG 24 hr capsule (Schedule II Drug) TK 2 CS PO ON MONDAY AND 1 C PO Q MORNING ALL OTHER DAYS     Cholecalciferol (VITAMIN D3) 125 MCG (5000 UT) CAPS Take 1 capsule by mouth daily.     hydrocortisone cream 1 % Apply 1 application topically 2 (two) times daily. 30 g 0   influenza vac split quadrivalent PF (FLUZONE QUADRIVALENT) 0.5 ML injection ADM 0.5ML IM UTD     loratadine (CLARITIN) 10 MG tablet Take 10 mg by mouth daily as needed for allergies.     methylphenidate (RITALIN) 10 MG tablet Take 10 mg by mouth as needed.     metroNIDAZOLE (METROCREAM) 0.75 % cream Apply topically 2 (two) times daily. 45 g 1   omeprazole (PRILOSEC OTC) 20 MG tablet Take 20 mg by mouth daily as needed (for acid reflux/indigestion.).     No current facility-administered medications for this visit.    Family History  Problem Relation Age of Onset   Hypertension Mother    Anemia Mother    Hypertension Maternal Grandmother    Cancer Sister    Heart attack Paternal Grandfather    Basal cell carcinoma Father  Hypertension Father    Cancer Maternal Grandfather        prostate in his 68's   Breast cancer Maternal Aunt 40       x2 recurence age 74    Review of Systems  Exam:   LMP 03/18/2017 (Exact Date)     General appearance: alert, cooperative and appears stated age Head: normocephalic, without obvious abnormality, atraumatic Neck: no adenopathy, supple, symmetrical, trachea midline and thyroid normal to inspection and palpation Lungs: clear to auscultation bilaterally Breasts: normal appearance, no masses or tenderness, No nipple retraction or dimpling, No nipple discharge or bleeding, No axillary adenopathy Heart: regular rate and rhythm Abdomen: soft, non-tender; no masses, no organomegaly Extremities: extremities normal,  atraumatic, no cyanosis or edema Skin: skin color, texture, turgor normal. No rashes or lesions Lymph nodes: cervical, supraclavicular, and axillary nodes normal. Neurologic: grossly normal  Pelvic: External genitalia:  no lesions              No abnormal inguinal nodes palpated.              Urethra:  normal appearing urethra with no masses, tenderness or lesions              Bartholins and Skenes: normal                 Vagina: normal appearing vagina with normal color and discharge, no lesions              Cervix: no lesions              Pap taken: {yes no:314532} Bimanual Exam:  Uterus:  normal size, contour, position, consistency, mobility, non-tender              Adnexa: no mass, fullness, tenderness              Rectal exam: {yes no:314532}.  Confirms.              Anus:  normal sphincter tone, no lesions  Chaperone was present for exam:  ***  Assessment:   Well woman visit with gynecologic exam.   Plan: Mammogram screening discussed. Self breast awareness reviewed. Pap and HR HPV as above. Guidelines for Calcium, Vitamin D, regular exercise program including cardiovascular and weight bearing exercise.   Follow up annually and prn.   Additional counseling given.  {yes [id number]. _______ minutes face to face time of which over 50% was spent in counseling.    After visit summary provided.

## 2022-06-01 ENCOUNTER — Ambulatory Visit (INDEPENDENT_AMBULATORY_CARE_PROVIDER_SITE_OTHER): Payer: BC Managed Care – PPO | Admitting: Adult Health

## 2022-06-01 ENCOUNTER — Encounter: Payer: Self-pay | Admitting: Adult Health

## 2022-06-01 DIAGNOSIS — F900 Attention-deficit hyperactivity disorder, predominantly inattentive type: Secondary | ICD-10-CM | POA: Diagnosis not present

## 2022-06-01 NOTE — Progress Notes (Signed)
[place] [id number] [birth year] 46 y.o.  Subjective:   Patient ID:  Caroline Hayes is a 46 y.o. (DOB 1975-09-06) female.  Chief Complaint: No chief complaint on file.   HPI Caroline Hayes presents to the office today for follow-up of ADHD.  Describes mood today as "ok". Pleasant. Mood symptoms - reports some general depression, anxiety and irritability - - "a malaise" - "more so since Thanksgiving". Reports some sadness with aging parents - wanting to help them but now sure how. Mood is consistent. Stating "I think I'm doing alright". Using Ritalin occasionally. She and partner doing well. Stable interest and motivation. Taking medications as prescribed.  Energy levels stable. Active, has a regular exercise routine. Enjoys some usual interests and activities. Lives with partner Caroline Hayes) of 20 years. No children. No pets. Family in [place].  Appetite adequate. Weight stable - 180 pounds. Sleeps has not been "great" lately - waking up early. Averages 6 to 7 hours. Focus and concentration improved. Completing tasks. Managing aspects of household. Works full time as Chief Financial Officer - remotely. Denies SI or HI.  Denies AH or VH.   Previous medication trials: Adderall, [facility]    PHQ2-9    [place] Office Visit from 01/16/2021 in [facility] Total Score 0        Review of Systems:  Review of Systems  Musculoskeletal:  Negative for gait problem.  Neurological:  Negative for tremors.  Psychiatric/Behavioral:         Please refer to HPI    Medications: I have reviewed the patient's current medications.  Current Outpatient Medications  Medication Sig Dispense Refill   amphetamine-dextroamphetamine (ADDERALL XR) 5 MG 24 hr capsule (Schedule II Drug) TK 2 CS PO ON MONDAY AND 1 C PO Q MORNING ALL OTHER DAYS     Cholecalciferol (VITAMIN D3) 125 MCG (5000 UT) CAPS Take 1 capsule by mouth daily.     hydrocortisone cream 1 %  Apply 1 application topically 2 (two) times daily. 30 g 0   influenza vac split quadrivalent PF (FLUZONE QUADRIVALENT) 0.5 ML injection ADM 0.5ML IM UTD     loratadine (CLARITIN) 10 MG tablet Take 10 mg by mouth daily as needed for allergies.     methylphenidate (RITALIN) 10 MG tablet Take 10 mg by mouth as needed.     metroNIDAZOLE (METROCREAM) 0.75 % cream Apply topically 2 (two) times daily. 45 g 1   omeprazole (PRILOSEC OTC) 20 MG tablet Take 20 mg by mouth daily as needed (for acid reflux/indigestion.).     No current facility-administered medications for this visit.    Medication Side Effects: None  Allergies:  Allergies  Allergen Reactions   Barley Green Itching   Eggs Or Egg-Derived Products Nausea Only   Ginger Other (See Comments)    Nose itchy   Imitrex [Sumatriptan Base] Nausea Only and Swelling   Soy Allergy    Wool Alcohol [Lanolin] Other (See Comments)    Face swelling, skin tight    Past Medical History:  Diagnosis Date   ADHD 2020   Anxiety    Chlamydia 2000   Depression    Dyspareunia    Family history of adverse reaction to anesthesia    mother with nausea and vomiting   Fibroid    GERD (gastroesophageal reflux disease)    occasional, mild   Headache    due to bnirth control pills   Heart murmur    HPV in female 2003  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   Please see review of systems for further details on the patient's review from today.   Objective:   Physical Exam:  LMP 03/18/2017 (Exact Date)   Physical Exam Constitutional:      General: She is not in acute distress. Musculoskeletal:        General: No deformity.  Neurological:     Mental Status: She is alert and oriented to person, place, and time.     Coordination: Coordination normal.  Psychiatric:         Attention and Perception: Attention and perception normal. She does not perceive auditory or visual hallucinations.        Mood and Affect: Mood normal. Mood is not anxious or depressed. Affect is not labile, blunt, angry or inappropriate.        Speech: Speech normal.        Behavior: Behavior normal.        Thought Content: Thought content normal. Thought content is not paranoid or delusional. Thought content does not include homicidal or suicidal ideation. Thought content does not include homicidal or suicidal plan.        Cognition and Memory: Cognition and memory normal.        Judgment: Judgment normal.     Comments: Insight intact     Lab Review:     Component Value Date/Time   NA 141 06/08/2020 0912   K 4.3 06/08/2020 0912   CL 107 (H) 06/08/2020 0912   CO2 22 06/08/2020 0912   GLUCOSE 89 06/08/2020 0912   GLUCOSE 78 01/17/2012 1704   BUN 8 06/08/2020 0912   CREATININE 0.76 06/08/2020 0912   CALCIUM 8.7 06/08/2020 0912   PROT 6.5 06/08/2020 0912   ALBUMIN 4.2 06/08/2020 0912   AST 17 06/08/2020 0912   ALT 28 06/08/2020 0912   ALKPHOS 82 06/08/2020 0912   BILITOT 0.8 06/08/2020 0912   GFRNONAA 97 06/08/2020 0912   GFRAA 112 06/08/2020 0912       Component Value Date/Time   WBC 5.4 06/08/2020 0912   WBC 6.0 07/02/2017 0848   RBC 4.28 06/08/2020 0912   RBC 4.74 07/02/2017 0848   HGB 13.2 06/08/2020 0912   HGB 14.6 11/24/2014 1031   HCT 39.0 06/08/2020 0912   PLT 186 06/08/2020 0912   MCV 91 06/08/2020 0912   MCH 30.8 06/08/2020 0912   MCH 30.8 07/02/2017 0848   MCHC 33.8 06/08/2020 0912   MCHC 34.4 07/02/2017 0848   RDW 12.4 06/08/2020 0912   LYMPHSABS 1.6 09/02/2013 1026   MONOABS 0.4 09/02/2013 1026   EOSABS 0.4 09/02/2013 1026   BASOSABS 0.0 09/02/2013 1026    No results found for: "POCLITH", "LITHIUM"   No results found for: "PHENYTOIN", "PHENOBARB", "VALPROATE", "CBMZ"   .res Assessment: Plan:    Plan:  PDMP reviewed  Continue Ritalin '10mg'$  -  taking 1/4 tablet occasionally.   Monitor BP between visits while taking stimulant medication.   RTC 6 months  Time spent with patient was 15 minutes. Greater than 50% of face to face time with patient was spent on counseling and coordination of care.   Patient advised to contact office with any questions, adverse effects, or acute worsening in signs and symptoms.  Discussed potential benefits, risks, and side effects of  stimulants with patient to include increased heart rate, palpitations, insomnia, increased anxiety, increased irritability, or decreased appetite.  Instructed patient to contact office if experiencing any significant tolerability issues.  Diagnoses and all orders for this visit:  Attention deficit hyperactivity disorder (ADHD), predominantly inattentive type     Please see After Visit Summary for patient specific instructions.  Future Appointments  Date Time Provider Raymond  06/14/2022  8:30 AM Yisroel Ramming, Everardo All, MD [facility] None    No orders of the defined types were placed in this encounter.   -------------------------------

## 2022-06-14 ENCOUNTER — Ambulatory Visit: Payer: BLUE CROSS/BLUE SHIELD | Admitting: Obstetrics and Gynecology

## 2022-06-21 ENCOUNTER — Ambulatory Visit (INDEPENDENT_AMBULATORY_CARE_PROVIDER_SITE_OTHER): Payer: BC Managed Care – PPO | Admitting: Radiology

## 2022-06-21 ENCOUNTER — Encounter: Payer: Self-pay | Admitting: Radiology

## 2022-06-21 VITALS — BP 112/78 | Ht 67.5 in | Wt 191.0 lb

## 2022-06-21 DIAGNOSIS — Z01419 Encounter for gynecological examination (general) (routine) without abnormal findings: Secondary | ICD-10-CM | POA: Diagnosis not present

## 2022-06-21 NOTE — Progress Notes (Signed)
   [place] August 15, 1975 [id number]   History:  46 y.o. G0 presents for annual exam. S/p hyst. Normal mammo in June. Currently managing a hemorrhoid with suppositories.  Gynecologic History Hysterectomy Sexually active:  yes  Health Maintenance Last Pap: 2022.  Last mammogram: 11/2018. Results were:   Past medical history, past surgical history, family history and social history were all reviewed and documented in the EPIC chart.  ROS:  A ROS was performed and pertinent positives and negatives are included.  Exam:  Vitals:   06/21/22 1127  BP: 112/78  Weight: 191 lb (86.6 kg)  Height: 5' 7.5" (1.715 m)   Body mass index is 29.47 kg/m.  General appearance:  Normal Thyroid:  Symmetrical, normal in size, without palpable masses or nodularity. Respiratory  Auscultation:  Clear without wheezing or rhonchi Cardiovascular  Auscultation:  Regular rate, without rubs, murmurs or gallops  Edema/varicosities:  Not grossly evident Abdominal  Soft,nontender, without masses, guarding or rebound.  Liver/spleen:  No organomegaly noted  Hernia:  None appreciated  Skin  Inspection:  Grossly normal Breasts: Examined lying and sitting.   Right: Without masses, retractions, nipple discharge or axillary adenopathy.   Left: Without masses, retractions, nipple discharge or axillary adenopathy. Genitourinary   Inguinal/mons:  Normal without inguinal adenopathy  External genitalia:  Normal appearing vulva with no masses, tenderness, or lesions  BUS/Urethra/Skene's glands:  Normal  Vagina:  Normal appearing with normal color and discharge, no lesions.   Cervix:  absent  Uterus:  absent  Adnexa/parametria:     Rt: Normal in size, without masses or tenderness.   Lt: Normal in size, without masses or tenderness.  Anus and perineum: Normal    Patient informed chaperone available to be present for breast and pelvic exam. Patient has requested no chaperone to be present. Patient has  been advised what will be completed during breast and pelvic exam.   Assessment/Plan:   1. Well woman exam with routine gynecological exam S/p hyst Pap up to date and normal 2022 Screening mammo 2024 as scheduled Colon cancer screening at 35     [place], Mack WHNP-BC 12:01 PM 06/21/2022

## 2022-07-25 ENCOUNTER — Encounter: Payer: Self-pay | Admitting: Family Medicine

## 2022-07-25 ENCOUNTER — Ambulatory Visit (INDEPENDENT_AMBULATORY_CARE_PROVIDER_SITE_OTHER): Payer: BC Managed Care – PPO

## 2022-07-25 ENCOUNTER — Ambulatory Visit (INDEPENDENT_AMBULATORY_CARE_PROVIDER_SITE_OTHER): Payer: BC Managed Care – PPO | Admitting: Family Medicine

## 2022-07-25 VITALS — BP 128/88 | HR 45 | Temp 97.6°F | Ht 67.5 in | Wt 194.0 lb

## 2022-07-25 DIAGNOSIS — I341 Nonrheumatic mitral (valve) prolapse: Secondary | ICD-10-CM

## 2022-07-25 DIAGNOSIS — R001 Bradycardia, unspecified: Secondary | ICD-10-CM

## 2022-07-25 DIAGNOSIS — Z8249 Family history of ischemic heart disease and other diseases of the circulatory system: Secondary | ICD-10-CM

## 2022-07-25 DIAGNOSIS — R002 Palpitations: Secondary | ICD-10-CM | POA: Diagnosis not present

## 2022-07-25 DIAGNOSIS — R9431 Abnormal electrocardiogram [ECG] [EKG]: Secondary | ICD-10-CM

## 2022-07-25 LAB — CBC WITH DIFFERENTIAL/PLATELET
Basophils Absolute: 0.1 10*3/uL (ref 0.0–0.1)
Basophils Relative: 0.7 % (ref 0.0–3.0)
Eosinophils Absolute: 0.3 10*3/uL (ref 0.0–0.7)
Eosinophils Relative: 4.2 % (ref 0.0–5.0)
HCT: 41.9 % (ref 36.0–46.0)
Hemoglobin: 14.7 g/dL (ref 12.0–15.0)
Lymphocytes Relative: 27.8 % (ref 12.0–46.0)
Lymphs Abs: 2.1 10*3/uL (ref 0.7–4.0)
MCHC: 35.1 g/dL (ref 30.0–36.0)
MCV: 89.2 fl (ref 78.0–100.0)
Monocytes Absolute: 0.5 10*3/uL (ref 0.1–1.0)
Monocytes Relative: 6.3 % (ref 3.0–12.0)
Neutro Abs: 4.7 10*3/uL (ref 1.4–7.7)
Neutrophils Relative %: 61 % (ref 43.0–77.0)
Platelets: 230 10*3/uL (ref 150.0–400.0)
RBC: 4.7 Mil/uL (ref 3.87–5.11)
RDW: 12.8 % (ref 11.5–15.5)
WBC: 7.6 10*3/uL (ref 4.0–10.5)

## 2022-07-25 LAB — COMPREHENSIVE METABOLIC PANEL
ALT: 27 U/L (ref 0–35)
AST: 19 U/L (ref 0–37)
Albumin: 4.4 g/dL (ref 3.5–5.2)
Alkaline Phosphatase: 75 U/L (ref 39–117)
BUN: 9 mg/dL (ref 6–23)
CO2: 29 mEq/L (ref 19–32)
Calcium: 9.4 mg/dL (ref 8.4–10.5)
Chloride: 103 mEq/L (ref 96–112)
Creatinine, Ser: 0.81 mg/dL (ref 0.40–1.20)
GFR: 88.25 mL/min (ref >60.00)
Glucose, Bld: 99 mg/dL (ref 70–99)
Potassium: 3.9 mEq/L (ref 3.5–5.1)
Sodium: 139 mEq/L (ref 135–145)
Total Bilirubin: 0.6 mg/dL (ref 0.2–1.2)
Total Protein: 7.1 g/dL (ref 6.0–8.3)

## 2022-07-25 LAB — T4, FREE: Free T4: 0.73 ng/dL (ref 0.60–1.60)

## 2022-07-25 LAB — TSH: TSH: 1.26 u[IU]/mL (ref 0.35–5.50)

## 2022-07-25 NOTE — Patient Instructions (Signed)
Please go downstairs for labs and a chest X ray before you leave.   You will hear from the cardiologist to schedule a visit.

## 2022-07-25 NOTE — Progress Notes (Signed)
Subjective:     Patient ID: Caroline Hayes, female    DOB: Jul 19, 1975, 47 y.o.   MRN: [id number]  Chief Complaint  Patient presents with   Palpitations    On and off for the past 2 days, denies chest pain. States more of a fluttering feeling, not currently having it now.     Palpitations    Patient is in today for a 3 day hx of palpitations. States she felt her heart beating irregularly and it lasted for one hour. This occurred during the night and woke her up.  Denies syncope, dizziness, chest pain, shortness of breath, nausea, or vomiting.   She has had additional episodes since lasting 1-2 hours.   States her resting heart rate is in the 40s-50s. States in HS she had a resting HR in the 30s.   She is exercising and no symptoms during exercise.   BP was 120s/70, pulse was normal in the 50s-60s.   Reports hx of heart murmur and MVP.      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   MVP (mitral valve prolapse)    Rubella immune 2010    Past Surgical History:  Procedure Laterality Date   CYSTOSCOPY N/A 07/09/2017   Procedure: CYSTOSCOPY;  Surgeon: Megan Salon, MD;  Location: [facility];  Service: Gynecology;  Laterality: N/A;   IUD REMOVAL N/A 07/09/2017   Procedure: INTRAUTERINE DEVICE (IUD) REMOVAL;  Surgeon: Megan Salon, MD;  Location: [facility];  Service: Gynecology;  Laterality: N/A;   ROOT CANAL  07/2014   TOTAL LAPAROSCOPIC HYSTERECTOMY WITH SALPINGECTOMY Bilateral 07/09/2017   Procedure: TOTAL  LAPAROSCOPIC HYSTERECTOMY WITH SALPINGECTOMY;  Surgeon: Megan Salon, MD;  Location: [facility];  Service: Gynecology;  Laterality: Bilateral;   WISDOM TOOTH EXTRACTION  1999    Family History  Problem Relation Age of Onset   Hypertension Mother    Anemia Mother    Dementia Mother    Basal cell carcinoma Father    Hypertension Father    Cancer Sister    Breast cancer Maternal Aunt 15       x2 recurence age 24   Hypertension Maternal Grandmother    Cancer Maternal Grandfather        prostate in his 2's   Heart attack Paternal Grandfather     Social History   Socioeconomic History   Marital status: Soil scientist    Spouse name: Not on file   Number of children: Not on file   Years of education: Not on file   Highest education level: Not on file  Occupational History   Not on file  Tobacco Use   Smoking status: Never    Passive exposure: Never   Smokeless tobacco: Never  Vaping Use   Vaping Use: Never used  Substance and Sexual Activity   Alcohol use: Yes    Alcohol/week:  1.0 standard drink of alcohol    Types: 1 Glasses of wine per week    Comment: socially   Drug use: No   Sexual activity: Yes    Partners: Male    Birth control/protection: Surgical    Comment: Hyst, menarche 47yo, sexual debut 47yo  Other Topics Concern   Not on file  Social History Narrative   Not on file   Social Determinants of Health   Financial Resource Strain: Not on file  Food Insecurity: Not on file  Transportation Needs: Not on file  Physical Activity: Not on file  Stress: Not on file  Social Connections: Not on file  Intimate Partner Violence: Not on file    Outpatient Medications Prior to Visit  Medication Sig Dispense Refill   Cholecalciferol (VITAMIN D3) 125 MCG (5000 UT) CAPS Take 1 capsule by mouth daily.     loratadine (CLARITIN) 10 MG tablet Take 10 mg by mouth daily as needed for allergies.     omeprazole (PRILOSEC OTC) 20 MG tablet Take 20 mg by mouth daily as  needed (for acid reflux/indigestion.).     influenza vac split quadrivalent PF (FLUZONE QUADRIVALENT) 0.5 ML injection ADM 0.5ML IM UTD (Patient not taking: Reported on 07/25/2022)     No facility-administered medications prior to visit.    Allergies  Allergen Reactions   Barley Green Itching   Eggs Or Egg-Derived Products Nausea Only   Ginger Other (See Comments)    Nose itchy   Imitrex [Sumatriptan Base] Nausea Only and Swelling   Soy Allergy    Wool Alcohol [Lanolin] Other (See Comments)    Face swelling, skin tight    Review of Systems  Cardiovascular:  Positive for palpitations.       Objective:    Physical Exam Constitutional:      General: She is not in acute distress.    Appearance: She is not ill-appearing.  HENT:     Mouth/Throat:     Mouth: Mucous membranes are moist.  Cardiovascular:     Rate and Rhythm: Regular rhythm. Bradycardia present.     Heart sounds: Murmur heard.  Pulmonary:     Effort: Pulmonary effort is normal.     Breath sounds: Normal breath sounds.  Skin:    General: Skin is warm and dry.  Neurological:     General: No focal deficit present.     Mental Status: She is alert and oriented to person, place, and time.  Psychiatric:        Mood and Affect: Mood normal.        Behavior: Behavior normal.        Thought Content: Thought content normal.     BP 128/88 (BP Location: Left Arm, Patient Position: Sitting, Cuff Size: Large)   Pulse (!) 45   Temp 97.6 F (36.4 C) (Temporal)   Ht 5' 7.5" (1.715 m)   Wt 194 lb (88 kg)   LMP 03/18/2017 (Exact Date)   SpO2 100%   BMI 29.94 kg/m  Wt Readings from Last 3 Encounters:  07/25/22 194 lb (88 kg)  06/21/22 191 lb (86.6 kg)  11/16/21 188 lb (85.3 kg)       Assessment & Plan:   Problem List Items Addressed This Visit       Cardiovascular and Mediastinum   MVP (mitral valve prolapse)   Relevant Orders   DG Chest 2 View   Ambulatory referral to Cardiology     Other    Palpitations -  Primary   Relevant Orders   CBC with Differential/Platelet   Comprehensive metabolic panel   TSH   T4, free   EKG 12-Lead   DG Chest 2 View   Ambulatory referral to Cardiology   Other Visit Diagnoses     Bradycardia       Relevant Orders   TSH   T4, free   EKG 12-Lead   DG Chest 2 View   Ambulatory referral to Cardiology   Family history of cardiac pacemaker       Relevant Orders   Ambulatory referral to Cardiology   Left axis deviation       Relevant Orders   Ambulatory referral to Cardiology      She is not in any acute distress. EKG shows sinus bradycardia, rate 49 with LAD.  No ST elevation. I will check labs including thyroid function and get a chest x-ray.  Referral to cardiology for further evaluation.  Advised that if she has syncope or chest pain that she should be evaluated emergently.  I have discontinued Brendolyn L. Tsuchiya's Fluzone Quadrivalent. I am also having her maintain her loratadine, omeprazole, and Vitamin D3.  No orders of the defined types were placed in this encounter.

## 2022-07-27 ENCOUNTER — Other Ambulatory Visit: Payer: Self-pay

## 2022-07-27 ENCOUNTER — Telehealth: Payer: Self-pay | Admitting: Physician Assistant

## 2022-07-27 ENCOUNTER — Emergency Department (HOSPITAL_COMMUNITY)
Admission: EM | Admit: 2022-07-27 | Discharge: 2022-07-27 | Disposition: A | Discharge status: Home / Self Care | Payer: BC Managed Care – PPO | Attending: Emergency Medicine | Admitting: Emergency Medicine

## 2022-07-27 ENCOUNTER — Encounter (HOSPITAL_COMMUNITY): Payer: Self-pay | Admitting: *Deleted

## 2022-07-27 DIAGNOSIS — I491 Atrial premature depolarization: Secondary | ICD-10-CM | POA: Insufficient documentation

## 2022-07-27 DIAGNOSIS — R002 Palpitations: Secondary | ICD-10-CM | POA: Diagnosis not present

## 2022-07-27 NOTE — Telephone Encounter (Signed)
Contacted by pt (personal friend) re: palpitations She sees you on 02/07  Was in the ER, PACs > PVCs on the monitor. D/c home.  Labs ok by PCP but no Mg drawn.  Suggested she supplement her K+ and Mg, and keep appt next week.   Ectopy improves w/ activity, no presyncope or syncope.  Rosaria Ferries, PA-C 07/27/2022 10:06 AM

## 2022-07-27 NOTE — Discharge Instructions (Addendum)
Please keep your appointment with cardiology.  You are having multiple premature beats which may be what you are feeling during these episodes.  Continue to avoid caffeine, do not take your Ritalin, be sure to stay hydrated.  Do not hesitate to return with any worsening or recurring symptoms.

## 2022-07-27 NOTE — ED Triage Notes (Addendum)
Here from home for recurrent palpitations, ongoing 5d, seen 2d ago for the same (with labs, EKG and CXR done), woke pt up at 0300, denies other sx, no known triggers, ETOH on Sunday, denies pain, NVD, sob, syncope, fever. Denies caffeine or sinus/cold medications. Alert, NAD, calm, interactive, steady gait. Cardiology appt next week. Declining protocol d/t recent visit. Agreeable to EKG. HR 75.

## 2022-07-27 NOTE — ED Provider Notes (Signed)
[facility] Provider Note   CSN: [id number] Arrival date & time: 07/27/22  1443     History  Chief Complaint  Patient presents with   Palpitations    Caroline Hayes is a 47 y.o. female with no known past medical history presenting today due to potation's.  Reports that been intermittent for around a week.  She had the most severe episodes 2 days ago however yesterday she started to feel better.  This morning she felt like her heart was "pounding".  She was recently seen by PCP 3 days ago for this complaint and had negative EKG and lab workup.  Has an appointment with cardiology next week.  No history of DVT, PE, shortness of breath, chest pain, hemoptysis, recent travel or estrogen use.  Has not been sick recently.  Denies alcohol use.   Palpitations Associated symptoms: no chest pain and no shortness of breath        Home Medications Prior to Admission medications   Medication Sig Start Date End Date Taking? Authorizing Provider  Cholecalciferol (VITAMIN D3) 125 MCG (5000 UT) CAPS Take 1 capsule by mouth daily.    [provider]  loratadine (CLARITIN) 10 MG tablet Take 10 mg by mouth daily as needed for allergies.    [provider]  omeprazole (PRILOSEC OTC) 20 MG tablet Take 20 mg by mouth daily as needed (for acid reflux/indigestion.).    [provider]      Allergies    Barley green, Eggs or egg-derived products, Ginger, Imitrex [sumatriptan base], Soy allergy, and Wool alcohol [lanolin]    Review of Systems   Review of Systems  Respiratory:  Negative for chest tightness and shortness of breath.   Cardiovascular:  Positive for palpitations. Negative for chest pain.    Physical Exam Updated Vital Signs BP 131/85   Pulse 64   Temp 97.6 F (36.4 C) (Oral)   Resp 18   Wt 88 kg   LMP 03/18/2017 (Exact Date)   SpO2 96%   BMI 29.94 kg/m  Physical Exam Vitals and nursing note reviewed.   Constitutional:      General: She is not in acute distress.    Appearance: Normal appearance. She is not ill-appearing.  HENT:     Head: Normocephalic and atraumatic.  Eyes:     General: No scleral icterus.    Conjunctiva/sclera: Conjunctivae normal.  Cardiovascular:     Rate and Rhythm: Normal rate and regular rhythm.     Comments: Occasional premature beat auscultated Pulmonary:     Effort: Pulmonary effort is normal. No respiratory distress.  Skin:    Findings: No rash.  Neurological:     Mental Status: She is alert.  Psychiatric:        Mood and Affect: Mood normal.     ED Results / Procedures / Treatments   Labs (all labs ordered are listed, but only abnormal results are displayed) Labs Reviewed - No data to display  EKG None  Radiology DG Chest 2 View  Result Date: 07/25/2022 CLINICAL DATA:  Provided history: Palpitations. Mitral valve prolapse. Bradycardia. History of heart murmur. EXAM: CHEST - 2 VIEW COMPARISON:  No pertinent prior exams available for comparison. FINDINGS: Apparent borderline cardiomegaly, although the cardiac silhouette is accentuated by shallow inspiration on the PA radiograph. No appreciable airspace consolidation or pulmonary edema. No evidence of pleural effusion or pneumothorax. No acute bony abnormality identified. IMPRESSION: Apparent borderline cardiomegaly, although the cardiac  silhouette is accentuated by shallow inspiration on the PA radiograph. No evidence of airspace consolidation or pulmonary edema. Electronically Signed   By: Kellie Simmering D.O.   On: 07/25/2022 19:25    Procedures Procedures   Medications Ordered in ED Medications - No data to display  ED Course/ Medical Decision Making/ A&P                             Medical Decision Making  47 year old female presenting with palpitations.  Differential includes but is not limited to electrolyte abnormality, PE, viral illness, anemia, substance use, arrhythmia and  PVCs.  Per external chart review patient was seen by PCP for this 3 days ago.  She had negative lab work and a normal EKG.  I am unable to see the EKG however patient is very in tune with her health so I trust that it was normal.  She also do not believe repeat lab work was necessary at this time  Workup: No labs performed.  EKG with multiple PACs.  MDM/disposition: 47 year old female presenting today with palpitations.  Differential is broad however she had multiple PACs on the monitor and on her EKG.  I suspect that she is feeling these as well as the occasional PVCs that have been noted.  She already has cardiology follow-up so I have encouraged her to keep this appointment.  Return precautions discussed.  PERC negative and I have no further concerns for emergent condition requiring further workup today.  No beta-blocker will be prescribed, patient is pretty intermittently bradycardic which is her baseline.  At this time she will be discharged with her friend who is a cardiology PA.   Final Clinical Impression(s) / ED Diagnoses Final diagnoses:  Heart palpitations  PAC (premature atrial contraction)    Rx / DC Orders ED Discharge Orders     None      Results and diagnoses were explained to the patient. Return precautions discussed in full. Patient had no additional questions and expressed complete understanding.   This chart was dictated using voice recognition software.  Despite best efforts to proofread,  errors can occur which can change the documentation meaning.    Rhae Hammock, PA-C 07/27/22 [place], DO 07/27/22 1109

## 2022-08-01 ENCOUNTER — Ambulatory Visit: Payer: BC Managed Care – PPO | Attending: Internal Medicine | Admitting: Internal Medicine

## 2022-08-01 ENCOUNTER — Other Ambulatory Visit: Payer: Self-pay | Admitting: Internal Medicine

## 2022-08-01 ENCOUNTER — Ambulatory Visit (INDEPENDENT_AMBULATORY_CARE_PROVIDER_SITE_OTHER): Payer: BC Managed Care – PPO

## 2022-08-01 ENCOUNTER — Encounter: Payer: Self-pay | Admitting: Internal Medicine

## 2022-08-01 VITALS — BP 116/76 | HR 60 | Ht 67.0 in | Wt 192.4 lb

## 2022-08-01 DIAGNOSIS — R002 Palpitations: Secondary | ICD-10-CM

## 2022-08-01 DIAGNOSIS — R9431 Abnormal electrocardiogram [ECG] [EKG]: Secondary | ICD-10-CM

## 2022-08-01 MED ORDER — METOPROLOL SUCCINATE ER 25 MG PO TB24: 12.5000 mg | ORAL_TABLET | Freq: Every day | ORAL | 3 refills | Status: DC

## 2022-08-01 NOTE — Progress Notes (Signed)
Cardiology Office Note:    Date:  08/01/2022   ID:  Caroline Hayes, DOB 10-21-1975, MRN [id number]  PCP:  Hoyt Koch, MD   [facility] Providers Cardiologist:  None     Referring MD: Girtha Rm, NP-C   No chief complaint on file. Palpitations  History of Present Illness:    Caroline Hayes is a 47 y.o. female with a hx of ADHD, anxiety, GERD, presented to the ED for ?palpitations, written as "potation's".  EKG showed sinus rhythm with ectopy. This started last Monday. She did cold water to the face and that helps. Has an aunt with PPM received in her 58s or 19s. Grandfather had MI in his 22s.  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   MVP (mitral valve prolapse)    Rubella immune 2010    Past Surgical History:  Procedure Laterality Date   CYSTOSCOPY N/A 07/09/2017   Procedure: CYSTOSCOPY;  Surgeon: Megan Salon, MD;  Location: [facility];  Service: Gynecology;  Laterality: N/A;   IUD REMOVAL N/A 07/09/2017   Procedure: INTRAUTERINE DEVICE (IUD) REMOVAL;  Surgeon: Megan Salon, MD;  Location: [facility];  Service: Gynecology;  Laterality: N/A;   ROOT CANAL  07/2014   TOTAL LAPAROSCOPIC HYSTERECTOMY WITH SALPINGECTOMY Bilateral 07/09/2017   Procedure: TOTAL LAPAROSCOPIC HYSTERECTOMY WITH SALPINGECTOMY;  Surgeon: Megan Salon, MD;  Location: [facility];  Service: Gynecology;  Laterality: Bilateral;   WISDOM TOOTH EXTRACTION  1999    Current Medications: Current Outpatient Medications on File Prior to Visit  Medication Sig Dispense Refill   Cholecalciferol (VITAMIN D3) 125 MCG (5000 UT) CAPS Take 1 capsule  by mouth daily.     loratadine (CLARITIN) 10 MG tablet Take 10 mg by mouth daily as needed for allergies.     omeprazole (PRILOSEC OTC) 20 MG tablet Take 20 mg by mouth daily as needed (for acid reflux/indigestion.).     No current facility-administered medications on file prior to visit.     Allergies:   Barley green, Eggs or egg-derived products, Ginger, Imitrex [sumatriptan base], Soy allergy, and Wool alcohol [lanolin]   Social History   Socioeconomic History   Marital status: Soil scientist    Spouse name: Not on file   Number of children: Not on file   Years of education: Not on file   Highest education level: Not on file  Occupational History   Not on file  Tobacco Use   Smoking status: Never    Passive exposure: Never   Smokeless tobacco: Never  Vaping Use   Vaping Use: Never used  Substance and Sexual Activity   Alcohol use: Yes    Alcohol/week: 1.0 standard drink of alcohol    Types: 1 Glasses of wine per week    Comment: socially   Drug use: No   Sexual activity: Yes    Partners: Male    Birth control/protection: Surgical    Comment: Hyst, menarche 47yo, sexual debut 47yo  Other Topics Concern   Not  on file  Social History Narrative   Not on file   Social Determinants of Health   Financial Resource Strain: Not on file  Food Insecurity: Not on file  Transportation Needs: Not on file  Physical Activity: Not on file  Stress: Not on file  Social Connections: Not on file     Family History: The patient's family history includes Anemia in her mother; Basal cell carcinoma in her father; Breast cancer (age of onset: 70) in her maternal aunt; Cancer in her maternal grandfather and sister; Dementia in her mother; Heart attack in her paternal grandfather; Hypertension in her father, maternal grandmother, and mother.  ROS:   Please see the history of present illness.     All other systems reviewed and are negative.  EKGs/Labs/Other Studies Reviewed:    The  following studies were reviewed today:   EKG:  EKG is  ordered today.  The ekg ordered today demonstrates   08/01/2022- NSR with PVCs, looks RVOT  Recent Labs: 07/25/2022: ALT 27; BUN 9; Creatinine, Ser 0.81; Hemoglobin 14.7; Platelets 230.0; Potassium 3.9; Sodium 139; TSH 1.26   Recent Lipid Panel    Component Value Date/Time   CHOL 205 (H) 06/08/2020 0912   TRIG 139 06/08/2020 0912   HDL 45 06/08/2020 0912   CHOLHDL 4.6 (H) 06/08/2020 0912   LDLCALC 135 (H) 06/08/2020 0912     Risk Assessment/Calculations:     Physical Exam:    VS:  Vitals:   08/01/22 1036  BP: 116/76  Pulse: 60  SpO2: 98%      LMP 03/18/2017 (Exact Date)     Wt Readings from Last 3 Encounters:  07/27/22 194 lb (88 kg)  07/25/22 194 lb (88 kg)  06/21/22 191 lb (86.6 kg)     GEN:  Well nourished, well developed in no acute distress HEENT: Normal NECK: No JVD; No carotid bruits LYMPHATICS: No lymphadenopathy CARDIAC: RRR, no murmurs, rubs, gallops RESPIRATORY:  Clear to auscultation without rales, wheezing or rhonchi  ABDOMEN: Soft, non-tender, non-distended MUSCULOSKELETAL:  No edema; No deformity  SKIN: Warm and dry NEUROLOGIC:  Alert and oriented x 3 PSYCHIATRIC:  Normal affect   ASSESSMENT:    PVCs: risk of CVD is low. Hydrate with electrolytes. Will ensure no structural heart disease.  PLAN:    In order of problems listed above:  3 day ziopatch TTE Start metop XL 12.5 mg daily Follow up in 6 months           Medication Adjustments/Labs and Tests Ordered: Current medicines are reviewed at length with the patient today.  Concerns regarding medicines are outlined above.  No orders of the defined types were placed in this encounter.  No orders of the defined types were placed in this encounter.   There are no Patient Instructions on file for this visit.   Signed, Janina Mayo, MD  08/01/2022 8:40 AM    [place]

## 2022-08-01 NOTE — Progress Notes (Unsigned)
Enrolled for [employer] to mail a ZIO XT long term holter monitor to the patients address on file.  

## 2022-08-01 NOTE — Patient Instructions (Signed)
Medication Instructions:  START: METOPROLOL SUCCINATE 12.'5mg'$  ONCE DAILY  *If you need a refill on your cardiac medications before your next appointment, please call your pharmacy*  Lab Work: None Ordered At This Time. If you have labs (blood work) drawn today and your tests are completely normal, you will receive your results only by: [facility] (if you have MyChart) OR A paper copy in the mail If you have any lab test that is abnormal or we need to change your treatment, we will call you to review the results.  Testing/Procedures: Your physician has requested that you have an echocardiogram. Echocardiography is a painless test that uses sound waves to create images of your heart. It provides your doctor with information about the size and shape of your heart and how well your heart's chambers and valves are working. You may receive an ultrasound enhancing agent through an IV if needed to better visualize your heart during the echo.This procedure takes approximately one hour. There are no restrictions for this procedure. This will take place at the [address].    ZIO XT- Long Term Monitor Instructions   Your physician has requested you wear your ZIO patch monitor___3____days.   This is a single patch monitor.  [employer] supplies one patch monitor per enrollment.  Additional stickers are not available.   Please do not apply patch if you will be having a Nuclear Stress Test, Echocardiogram, Cardiac CT, MRI, or Chest Xray during the time frame you would be wearing the monitor. The patch cannot be worn during these tests.  You cannot remove and re-apply the ZIO XT patch monitor.   Your ZIO patch monitor will be sent USPS Priority mail from [facility] directly to your home address. The monitor may also be mailed to a PO BOX if home delivery is not available.   It may take 3-5 days to receive your monitor after you have been enrolled.   Once you have received you  monitor, please review enclosed instructions.  Your monitor has already been registered assigning a specific monitor serial # to you.   Applying the monitor   Shave hair from upper left chest.   Hold abrader disc by orange tab.  Rub abrader in 40 strokes over left upper chest as indicated in your monitor instructions.   Clean area with 4 enclosed alcohol pads .  Use all pads to assure are is cleaned thoroughly.  Let dry.   Apply patch as indicated in monitor instructions.  Patch will be place under collarbone on left side of chest with arrow pointing upward.   Rub patch adhesive wings for 2 minutes.Remove white label marked "1".  Remove white label marked "2".  Rub patch adhesive wings for 2 additional minutes.   While looking in a mirror, press and release button in center of patch.  A small green light will flash 3-4 times .  This will be your only indicator the monitor has been turned on.     Do not shower for the first 24 hours.  You may shower after the first 24 hours.   Press button if you feel a symptom. You will hear a small click.  Record Date, Time and Symptom in the Patient Log Book.   When you are ready to remove patch, follow instructions on last 2 pages of Patient Log Book.  Stick patch monitor onto last page of Patient Log Book.   Place Patient Log Book in [place] box.  Use locking tab on box and tape box closed securely.  The [employer] has [employer] on it.  Please place in mailbox as soon as possible.  Your physician should have your test results approximately 7 days after the monitor has been mailed back to [facility].   Call [place] at [phone] if you have questions regarding your ZIO XT patch monitor.  Call them immediately if you see an orange light blinking on your monitor.   If your monitor falls off in less than 4 days contact our Monitor department at [phone].  If your monitor becomes loose or falls off after 4 days  call [employer] at [phone] for suggestions on securing your monitor.    Follow-Up: At [facility], you and your health needs are our priority.  As part of our continuing mission to provide you with exceptional heart care, we have created designated Provider Care Teams.  These Care Teams include your primary Cardiologist (physician) and Advanced Practice Providers (APPs -  Physician Assistants and Nurse Practitioners) who all work together to provide you with the care you need, when you need it.  Your next appointment:   6 month(s)  Provider:   Janina Mayo, MD

## 2022-08-03 ENCOUNTER — Telehealth: Payer: Self-pay | Admitting: Internal Medicine

## 2022-08-03 NOTE — Telephone Encounter (Signed)
Patient has received 2 zio monitors in the mail and would like to know if this was sent in error. Requesting return call.

## 2022-08-03 NOTE — Telephone Encounter (Signed)
Pt stated she received 2 Zio monitors. Recommended she contact the company on how to send one back and not be charged for it.

## 2022-08-14 NOTE — Telephone Encounter (Signed)
Hello,   This email is to notify you that the patient listed below is returning their Zio device back unused.   Account: [facility] Patient InitialsMarthann Schiller SN: [id number] Ticket: [id number]   If you have any questions, please respond to this email, or call our customer care team at [phone].   For more information about other unresolved or pending items, please log in to [facility] or refer to your Zio Action Report, which is sent every Monday and Thursday via email.    Thank you,  [place]

## 2022-08-21 ENCOUNTER — Ambulatory Visit (INDEPENDENT_AMBULATORY_CARE_PROVIDER_SITE_OTHER): Payer: BC Managed Care – PPO

## 2022-08-21 DIAGNOSIS — R9431 Abnormal electrocardiogram [ECG] [EKG]: Secondary | ICD-10-CM

## 2022-08-22 LAB — ECHOCARDIOGRAM COMPLETE
Area-P 1/2: 3.85 cm2
S' Lateral: 2.8 cm

## 2022-10-08 DIAGNOSIS — M9901 Segmental and somatic dysfunction of cervical region: Secondary | ICD-10-CM | POA: Diagnosis not present

## 2022-10-08 DIAGNOSIS — M9902 Segmental and somatic dysfunction of thoracic region: Secondary | ICD-10-CM | POA: Diagnosis not present

## 2022-10-08 DIAGNOSIS — M9904 Segmental and somatic dysfunction of sacral region: Secondary | ICD-10-CM | POA: Diagnosis not present

## 2022-10-08 DIAGNOSIS — M5417 Radiculopathy, lumbosacral region: Secondary | ICD-10-CM | POA: Diagnosis not present

## 2022-10-08 DIAGNOSIS — M9903 Segmental and somatic dysfunction of lumbar region: Secondary | ICD-10-CM | POA: Diagnosis not present

## 2022-10-26 ENCOUNTER — Other Ambulatory Visit: Payer: Self-pay | Admitting: Obstetrics and Gynecology

## 2022-10-26 DIAGNOSIS — Z1231 Encounter for screening mammogram for malignant neoplasm of breast: Secondary | ICD-10-CM

## 2022-10-30 DIAGNOSIS — M9903 Segmental and somatic dysfunction of lumbar region: Secondary | ICD-10-CM | POA: Diagnosis not present

## 2022-10-30 DIAGNOSIS — M5417 Radiculopathy, lumbosacral region: Secondary | ICD-10-CM | POA: Diagnosis not present

## 2022-10-30 DIAGNOSIS — M9904 Segmental and somatic dysfunction of sacral region: Secondary | ICD-10-CM | POA: Diagnosis not present

## 2022-10-30 DIAGNOSIS — M9902 Segmental and somatic dysfunction of thoracic region: Secondary | ICD-10-CM | POA: Diagnosis not present

## 2022-11-30 ENCOUNTER — Ambulatory Visit: Payer: BC Managed Care – PPO | Admitting: Adult Health

## 2022-12-04 ENCOUNTER — Ambulatory Visit
Admission: RE | Admit: 2022-12-04 | Discharge: 2022-12-04 | Disposition: A | Discharge status: Home / Self Care | Payer: BC Managed Care – PPO | Source: Ambulatory Visit | Attending: Obstetrics and Gynecology | Admitting: Obstetrics and Gynecology

## 2022-12-04 ENCOUNTER — Ambulatory Visit: Payer: BC Managed Care – PPO

## 2022-12-04 DIAGNOSIS — Z1231 Encounter for screening mammogram for malignant neoplasm of breast: Secondary | ICD-10-CM | POA: Diagnosis not present

## 2022-12-06 ENCOUNTER — Encounter: Payer: Self-pay | Admitting: Adult Health

## 2022-12-06 ENCOUNTER — Ambulatory Visit (INDEPENDENT_AMBULATORY_CARE_PROVIDER_SITE_OTHER): Payer: BC Managed Care – PPO | Admitting: Adult Health

## 2022-12-06 DIAGNOSIS — F900 Attention-deficit hyperactivity disorder, predominantly inattentive type: Secondary | ICD-10-CM

## 2022-12-06 NOTE — Progress Notes (Signed)
Caroline Hayes [place] [id number] [birth year] 47 y.o.  Subjective:   Patient ID:  Caroline Hayes is a 47 y.o. (DOB February 17, 1976) female.  Chief Complaint: No chief complaint on file.   HPI Ceriah Chandni Hayes presents to the office today for follow-up of ADHD.  Describes mood today as "ok". Pleasant. Mood symptoms - denies depression. Reports anxiety and irritability "more work related". Reports some sadness with aging parents - both 36 years old. Reports some worry, rumination, and over thinking - "normal for me". Mood is consistent. Stating "I feel like I'm doing alright". Has not used Ritalin since last visit. She and partner doing well. Stable interest and motivation. Taking medications as prescribed.  Energy levels stable. Active, has a regular exercise routine - weight lifting. Enjoys some usual interests and activities. Lives with partner [name]). No children. No pets. Family in [place].  Appetite adequate. Weight stable - 190 pounds. Sleeps well most nights. Averages 7 to 8 hours. Focus and concentration improved. Completing tasks. Managing aspects of household. Works full time as Art gallery manager - remotely. Denies SI or HI.  Denies AH or VH.  Previous medication trials: Adderall, Lurlean Leyden  PHQ2-9    Flowsheet Row Office Visit from 07/25/2022 in [facility] Visit from 01/16/2021 in [facility]  PHQ-2 Total Score 0 0      Flowsheet Row ED from 07/27/2022 in [facility] Emergency Department at [facility]  C-SSRS RISK CATEGORY No Risk       Review of Systems:  Review of Systems  Musculoskeletal:  Negative for gait problem.  Neurological:  Negative for tremors.  Psychiatric/Behavioral:         Please refer to HPI    Medications: I have reviewed the patient's current medications.  Current Outpatient Medications  Medication Sig Dispense Refill   Cholecalciferol (VITAMIN D3) 125 MCG  (5000 UT) CAPS Take 1 capsule by mouth daily.     loratadine (CLARITIN) 10 MG tablet Take 10 mg by mouth daily as needed for allergies.     metoprolol succinate (TOPROL-XL) 25 MG 24 hr tablet Take 0.5 tablets (12.5 mg total) by mouth daily. Take with or immediately following a meal. 45 tablet 3   omeprazole (PRILOSEC OTC) 20 MG tablet Take 20 mg by mouth daily as needed (for acid reflux/indigestion.).     No current facility-administered medications for this visit.    Medication Side Effects: None  Allergies:  Allergies  Allergen Reactions   Barley Green Itching   Egg-Derived Products Nausea Only   Ginger Other (See Comments)    Nose itchy   Imitrex [Sumatriptan Base] Nausea Only and Swelling   Soy Allergy    Wool Alcohol [Lanolin] Other (See Comments)    Face swelling, skin tight    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   Please see review of systems for further details on the patient's review from today.   Objective:   Physical Exam:  LMP 03/18/2017 (Exact Date)   Physical Exam Constitutional:      General: She is not in acute distress. Musculoskeletal:        General: No deformity.  Neurological:     Mental Status: She is alert and oriented to person, place, and time.     Coordination: Coordination normal.  Psychiatric:        Attention and Perception: Attention and perception normal. She does not perceive auditory or visual hallucinations.         Mood and Affect: Mood normal. Mood is not anxious or depressed. Affect is not labile, blunt, angry or inappropriate.        Speech: Speech normal.        Behavior: Behavior normal.        Thought Content: Thought content normal. Thought content is not paranoid or delusional. Thought content does not include homicidal or suicidal ideation. Thought content does not include homicidal or suicidal plan.        Cognition and Memory: Cognition and memory normal.        Judgment: Judgment normal.     Comments: Insight intact     Lab Review:     Component Value Date/Time   NA 139 07/25/2022 1517   NA 141 06/08/2020 0912   K 3.9 07/25/2022 1517   CL 103 07/25/2022 1517   CO2 29 07/25/2022 1517   GLUCOSE 99 07/25/2022 1517   BUN 9 07/25/2022 1517   BUN 8 06/08/2020 0912   CREATININE 0.81 07/25/2022 1517   CALCIUM 9.4 07/25/2022 1517   PROT 7.1 07/25/2022 1517   PROT 6.5 06/08/2020 0912   ALBUMIN 4.4 07/25/2022 1517   ALBUMIN 4.2 06/08/2020 0912   AST 19 07/25/2022 1517   ALT 27 07/25/2022 1517   ALKPHOS 75 07/25/2022 1517   BILITOT 0.6 07/25/2022 1517   BILITOT 0.8 06/08/2020 0912   GFRNONAA 97 06/08/2020 0912   GFRAA 112 06/08/2020 0912       Component Value Date/Time   WBC 7.6 07/25/2022 1517   RBC 4.70 07/25/2022 1517   HGB 14.7 07/25/2022 1517   HGB 13.2 06/08/2020 0912   HGB 14.6 11/24/2014 1031   HCT 41.9 07/25/2022 1517   HCT 39.0 06/08/2020 0912   PLT 230.0 07/25/2022 1517   PLT 186 06/08/2020 0912   MCV 89.2 07/25/2022 1517   MCV 91 06/08/2020 0912   MCH 30.8 06/08/2020 0912   MCH 30.8 07/02/2017 0848   MCHC 35.1 07/25/2022 1517   RDW 12.8 07/25/2022 1517   RDW 12.4 06/08/2020 0912   LYMPHSABS 2.1 07/25/2022 1517   MONOABS 0.5 07/25/2022 1517   EOSABS 0.3 07/25/2022 1517   BASOSABS 0.1 07/25/2022 1517    No results found for: "POCLITH", "LITHIUM"   No results found for: "PHENYTOIN", "PHENOBARB", "VALPROATE", "CBMZ"   .res Assessment: Plan:     Plan:  PDMP reviewed  Discontinue Ritalin 10mg  - not taking  Monitor BP between visits while taking stimulant medication.   RTC as needed.  Time spent with patient was 15 minutes. Greater than 50% of face to face time with patient was spent on counseling and coordination of care.   Patient advised to contact office with any questions, adverse effects, or acute worsening in signs and symptoms.  Discussed potential benefits, risks, and side effects of stimulants  with patient to include increased heart rate, palpitations, insomnia, increased anxiety, increased irritability, or decreased appetite. Instructed patient to contact office if experiencing any significant tolerability issues.  Diagnoses and all orders for this visit: Diagnoses and all orders for this visit:  Attention deficit hyperactivity disorder (ADHD), predominantly inattentive type     Please see After Visit Summary for patient specific instructions.  Future Appointments  Date Time Provider Department Center  02/04/2023  8:20 AM Maisie Fus, MD [facility] None    No orders of the defined types were placed in this encounter.   -------------------------------

## 2023-02-04 ENCOUNTER — Ambulatory Visit: Payer: BC Managed Care – PPO | Admitting: Internal Medicine

## 2023-03-01 ENCOUNTER — Ambulatory Visit: Payer: BC Managed Care – PPO | Attending: Internal Medicine | Admitting: Nurse Practitioner

## 2023-03-01 ENCOUNTER — Encounter: Payer: Self-pay | Admitting: Nurse Practitioner

## 2023-03-01 VITALS — BP 132/70 | HR 48 | Ht 67.0 in | Wt 192.6 lb

## 2023-03-01 DIAGNOSIS — R002 Palpitations: Secondary | ICD-10-CM | POA: Diagnosis not present

## 2023-03-01 DIAGNOSIS — I493 Ventricular premature depolarization: Secondary | ICD-10-CM

## 2023-03-01 DIAGNOSIS — R001 Bradycardia, unspecified: Secondary | ICD-10-CM | POA: Diagnosis not present

## 2023-03-01 DIAGNOSIS — I491 Atrial premature depolarization: Secondary | ICD-10-CM | POA: Diagnosis not present

## 2023-03-01 DIAGNOSIS — R0609 Other forms of dyspnea: Secondary | ICD-10-CM

## 2023-03-01 NOTE — Progress Notes (Signed)
Office Visit    Patient Name: Caroline Hayes Date of Encounter: 03/01/2023  Primary Care Provider:  Myrlene Broker, MD Primary Cardiologist:  Maisie Fus, MD  Chief Complaint    47 year old female with a history of palpitations, PACs, PVCs, ADHD, anxiety, and GERD who presents for follow-up related to palpitations.  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   MVP (mitral valve prolapse)    Rubella immune 2010   Past Surgical History:  Procedure Laterality Date   CYSTOSCOPY N/A 07/09/2017   Procedure: CYSTOSCOPY;  Surgeon: Jerene Bears, MD;  Location: [facility];  Service: Gynecology;  Laterality: N/A;   IUD REMOVAL N/A 07/09/2017   Procedure: INTRAUTERINE DEVICE (IUD) REMOVAL;  Surgeon: Jerene Bears, MD;  Location: [facility];  Service: Gynecology;  Laterality: N/A;   ROOT CANAL  07/2014   TOTAL LAPAROSCOPIC HYSTERECTOMY WITH SALPINGECTOMY Bilateral 07/09/2017   Procedure: TOTAL LAPAROSCOPIC HYSTERECTOMY WITH SALPINGECTOMY;  Surgeon: Jerene Bears, MD;  Location: [facility];  Service: Gynecology;  Laterality: Bilateral;   WISDOM TOOTH EXTRACTION  1999    Allergies  Allergies  Allergen Reactions   Barley Green Itching   Egg-Derived Products Nausea Only   Ginger Other (See Comments)    Nose itchy   Imitrex [Sumatriptan Base] Nausea Only and Swelling   Soy Allergy Itching   Wool Alcohol [Lanolin] Other (See Comments)    Face swelling, skin tight     Labs/Other Studies Reviewed    The following studies were reviewed today:  Cardiac Studies & Procedures       ECHOCARDIOGRAM  ECHOCARDIOGRAM  COMPLETE 08/21/2022  Narrative ECHOCARDIOGRAM REPORT    Patient Name:   Caroline Hayes Date of Exam: 08/21/2022 Medical Rec #:  [id number]           Height:       67.0 in Accession #:    [id number]          Weight:       192.4 lb Date of Birth:  27-Jan-1976           BSA:          1.989 m Patient Age:    46 years            BP:           110/70 mmHg Patient Gender: F                   HR:           46 bpm. Exam Location:  Outpatient  Procedure: 2D Echo, 3D Echo, Color Doppler, Cardiac Doppler and Strain Analysis  Indications:    R94.31 Abnormal EKG  History:        Patient has no prior history of Echocardiogram examinations. Risk Factors:Non-Smoker. Mitral Valve Prolapse.  Sonographer:    Jeryl Columbia RDCS Referring Phys: [phone] [facility]  IMPRESSIONS   1. Left ventricular ejection fraction, by estimation, is 60 to 65%.  The left ventricle has normal function. The left ventricle has no regional wall motion abnormalities. There is mild left ventricular hypertrophy. Left ventricular diastolic parameters were normal. 2. Right ventricular systolic function is normal. The right ventricular size is normal. 3. Left atrial size was mildly dilated. 4. The mitral valve is normal in structure. No evidence of mitral valve regurgitation. No evidence of mitral stenosis. 5. The aortic valve is tricuspid. Aortic valve regurgitation is trivial. No aortic stenosis is present. 6. The inferior vena cava is normal in size with greater than 50% respiratory variability, suggesting right atrial pressure of 3 mmHg.  FINDINGS Left Ventricle: Left ventricular ejection fraction, by estimation, is 60 to 65%. The left ventricle has normal function. The left ventricle has no regional wall motion abnormalities. The left ventricular internal cavity size was normal in size. There is mild left ventricular hypertrophy. Left ventricular diastolic parameters were normal.  Right Ventricle: The right  ventricular size is normal. Right vetricular wall thickness was not well visualized. Right ventricular systolic function is normal. The tricuspid regurgitant velocity is 1.54 m/s, and with an assumed right atrial pressure of 3 mmHg, the estimated right ventricular systolic pressure is 12.5 mmHg.  Left Atrium: Left atrial size was mildly dilated.  Right Atrium: Right atrial size was normal in size.  Pericardium: There is no evidence of pericardial effusion.  Mitral Valve: The mitral valve is normal in structure. No evidence of mitral valve regurgitation. No evidence of mitral valve stenosis.  Tricuspid Valve: The tricuspid valve is normal in structure. Tricuspid valve regurgitation is trivial.  Aortic Valve: The aortic valve is tricuspid. Aortic valve regurgitation is trivial. No aortic stenosis is present.  Pulmonic Valve: The pulmonic valve was not well visualized. Pulmonic valve regurgitation is trivial.  Aorta: The aortic root and ascending aorta are structurally normal, with no evidence of dilitation.  Venous: The inferior vena cava is normal in size with greater than 50% respiratory variability, suggesting right atrial pressure of 3 mmHg.  IAS/Shunts: The interatrial septum was not well visualized.   LEFT VENTRICLE PLAX 2D LVIDd:         4.77 cm   Diastology LVIDs:         2.80 cm   LV e' medial:    10.20 cm/s LV PW:         1.28 cm   LV E/e' medial:  10.2 LV IVS:        0.97 cm   LV e' lateral:   12.30 cm/s LVOT diam:     1.90 cm   LV E/e' lateral: 8.5 LV SV:         65 LV SV Index:   33 LVOT Area:     2.84 cm  3D Volume EF: 3D EF:        75 % LV EDV:       153 ml LV ESV:       38 ml LV SV:        114 ml  RIGHT VENTRICLE RV Basal diam:  4.02 cm RV Mid diam:    3.20 cm  LEFT ATRIUM              Index        RIGHT ATRIUM           Index LA diam:        4.20 cm  2.11 cm/m   RA Area:     13.70 cm LA Vol (A2C):   101.0  ml 50.77 ml/m  RA Volume:   34.40 ml  17.29  ml/m LA Vol (A4C):   42.5 ml  21.37 ml/m LA Biplane Vol: 67.3 ml  33.83 ml/m AORTIC VALVE LVOT Vmax:   92.50 cm/s LVOT Vmean:  59.500 cm/s LVOT VTI:    0.230 m  AORTA Ao Root diam: 2.90 cm Ao Asc diam:  3.30 cm  MITRAL VALVE                TRICUSPID VALVE MV Area (PHT): 3.85 cm     TR Peak grad:   9.5 mmHg MV Decel Time: 197 msec     TR Vmax:        154.00 cm/s MV E velocity: 104.00 cm/s MV A velocity: 56.20 cm/s   SHUNTS MV E/A ratio:  1.85         Systemic VTI:  0.23 m Systemic Diam: 1.90 cm  Epifanio Lesches MD Electronically signed by Epifanio Lesches MD Signature Date/Time: 08/22/2022/3:35:58 PM    Final    MONITORS  LONG TERM MONITOR (3-14 DAYS) 08/16/2022  Narrative 20 triggered events. For sinus rhythm and frequent PACs  Patch Wear Time:  2 days and 23 hours (2024-02-09T21:42:13-0500 to 2024-02-12T21:24:23-0500)  Patient had a min HR of 34 bpm, max HR of 184 bpm, and avg HR of 54 bpm. Predominant underlying rhythm was Sinus Rhythm. Slight P wave morphology changes were noted. 1 run of Supraventricular Tachycardia occurred lasting 5 beats with a max rate of 184 bpm (avg 162 bpm). Isolated SVEs were frequent (11.0%, [id number]), SVE Couplets were rare (<1.0%, 90), and SVE Triplets were rare (<1.0%, 14). Isolated VEs were rare (<1.0%), and no VE Couplets or VE Triplets were present.          Recent Labs: 07/25/2022: ALT 27; BUN 9; Creatinine, Ser 0.81; Hemoglobin 14.7; Platelets 230.0; Potassium 3.9; Sodium 139; TSH 1.26  Recent Lipid Panel    Component Value Date/Time   CHOL 205 (H) 06/08/2020 0912   TRIG 139 06/08/2020 0912   HDL 45 06/08/2020 0912   CHOLHDL 4.6 (H) 06/08/2020 0912   LDLCALC 135 (H) 06/08/2020 0912    History of Present Illness    47 year old female with the above past medical history including palpitations, PACs, PVCs, ADHD, anxiety, and GERD.  She was seen in the ED in February 2024 in the setting of palpitations.  She was last  seen in the office on 08/01/2022 and was stable from a cardiac standpoint though she did note ongoing palpitations.  Outpatient cardiac monitor revealed predominantly sinus rhythm, 1 run of SVT lasting 5 beats, frequent PACs (11%), rare PVCs.  She was started on metoprolol.  Echocardiogram at time showed EF 60 to 65%, normal LV function, no RWMA, mild LVH, normal RV, no significant valvular abnormalities.  She presents today for follow-up.  Since her last visit she has been stable from a cardiac standpoint.  She denies any palpitations.  She does have baseline bradycardia, unchanged from prior visits, she is asymptomatic with this.  She notes she is interested in weaning her metoprolol. She has been focused on weight lifting this past year and has overall decreased her cardio workouts.  She does note that when she does do cardio it is more challenging and she does note some mild dyspnea.  She denies any chest pain or exertional symptoms with weightlifting.  Overall, she reports feeling well.  Home Medications    Current Outpatient Medications  Medication Sig Dispense Refill   Cholecalciferol (  VITAMIN D3) 125 MCG (5000 UT) CAPS Take 1 capsule by mouth daily.     loratadine (CLARITIN) 10 MG tablet Take 10 mg by mouth daily as needed for allergies.     metoprolol succinate (TOPROL-XL) 25 MG 24 hr tablet Take 0.5 tablets (12.5 mg total) by mouth daily. Take with or immediately following a meal. 45 tablet 3   omeprazole (PRILOSEC OTC) 20 MG tablet Take 20 mg by mouth daily as needed (for acid reflux/indigestion.).     No current facility-administered medications for this visit.     Review of Systems    She denies chest pain, palpitations, pnd, orthopnea, n, v, dizziness, syncope, edema, weight gain, or early satiety. All other systems reviewed and are otherwise negative except as noted above.   Physical Exam    VS:  BP 132/70 (BP Location: Left Arm, Patient Position: Sitting, Cuff Size: Large)    Pulse (!) 48   Ht 5\' 7"  (1.702 m)   Wt 192 lb 9.6 oz (87.4 kg)   LMP 03/18/2017 (Exact Date)   SpO2 98%   BMI 30.17 kg/m   GEN: Well nourished, well developed, in no acute distress. HEENT: normal. Neck: Supple, no JVD, carotid bruits, or masses. Cardiac: RRR, no murmurs, rubs, or gallops. No clubbing, cyanosis, edema.  Radials/DP/PT 2+ and equal bilaterally.  Respiratory:  Respirations regular and unlabored, clear to auscultation bilaterally. GI: Soft, nontender, nondistended, BS + x 4. MS: no deformity or atrophy. Skin: warm and dry, no rash. Neuro:  Strength and sensation are intact. Psych: Normal affect.  Accessory Clinical Findings    ECG personally reviewed by me today - EKG Interpretation Date/Time:  Friday March 01 2023 09:11:05 EDT Ventricular Rate:  49 PR Interval:  194 QRS Duration:  88 QT Interval:  454 QTC Calculation: 410 R Axis:   -71  Text Interpretation: Sinus bradycardia Left anterior fascicular block Confirmed by Bernadene Person ([id number]) on 03/01/2023 9:20:31 AM     Lab Results  Component Value Date   WBC 7.6 07/25/2022   HGB 14.7 07/25/2022   HCT 41.9 07/25/2022   MCV 89.2 07/25/2022   PLT 230.0 07/25/2022   Lab Results  Component Value Date   CREATININE 0.81 07/25/2022   BUN 9 07/25/2022   NA 139 07/25/2022   K 3.9 07/25/2022   CL 103 07/25/2022   CO2 29 07/25/2022   Lab Results  Component Value Date   ALT 27 07/25/2022   AST 19 07/25/2022   ALKPHOS 75 07/25/2022   BILITOT 0.6 07/25/2022   Lab Results  Component Value Date   CHOL 205 (H) 06/08/2020   HDL 45 06/08/2020   LDLCALC 135 (H) 06/08/2020   TRIG 139 06/08/2020   CHOLHDL 4.6 (H) 06/08/2020    No results found for: "HGBA1C"  Assessment & Plan    1. Palpitations/PACs/PVCs/bradycardia: Outpatient cardiac monitor revealed predominantly sinus rhythm, 1 run of SVT lasting 5 beats, frequent PACs (11%), rare PVCs.  She was started on metoprolol.  Echocardiogram at time showed EF 60  to 65%, normal LV function, no RWMA, mild LVH, normal RV, no significant valvular abnormalities. She has longstanding history of baseline bradycardia, asymptomatic.  She has not had any recent palpitations on metoprolol, however, she notes she is interested in stopping metoprolol at this time. We discussed that it would be okay to try to wean metoprolol and see how she feels.  I did advise her that should she have increased palpitations off metoprolol, I would recommend restarting  the medication, she is agreeable.  Continue to monitor symptoms.  2. Mild dyspnea on exertion: She has been focused on weight lifting this past year and has overall decreased her cardio workouts.  She notes that when she does do cardio it is more challenging and she does note some mild dyspnea.  She denies any chest pain, denies exertional symptoms with weightlifting or other activity.  Encouraged her to work on increasing her cardio tolerance.  Should she continue to notice worsening dyspnea on exertion, consider ischemic evaluation (possible coronary CT angiogram), will defer for now.  3. Disposition: Follow-up in 6 months, sooner if needed.       Joylene Grapes, NP 03/01/2023, 9:42 AM

## 2023-03-01 NOTE — Patient Instructions (Signed)
Medication Instructions:  No Changes *If you need a refill on your cardiac medications before your next appointment, please call your pharmacy*   Lab Work: No Labs If you have labs (blood work) drawn today and your tests are completely normal, you will receive your results only by: MyChart Message (if you have MyChart) OR A paper copy in the mail If you have any lab test that is abnormal or we need to change your treatment, we will call you to review the results.   Testing/Procedures: No Testing   Follow-Up: At [facility], you and your health needs are our priority.  As part of our continuing mission to provide you with exceptional heart care, we have created designated Provider Care Teams.  These Care Teams include your primary Cardiologist (physician) and Advanced Practice Providers (APPs -  Physician Assistants and Nurse Practitioners) who all work together to provide you with the care you need, when you need it.  We recommend signing up for the patient portal called "MyChart".  Sign up information is provided on this After Visit Summary.  MyChart is used to connect with patients for Virtual Visits (Telemedicine).  Patients are able to view lab/test results, encounter notes, upcoming appointments, etc.  Non-urgent messages can be sent to your provider as well.   To learn more about what you can do with MyChart, go to [email or web address].    Your next appointment:   6 month(s)  Provider:   Maisie Fus, MD

## 2023-03-05 DIAGNOSIS — M9903 Segmental and somatic dysfunction of lumbar region: Secondary | ICD-10-CM | POA: Diagnosis not present

## 2023-03-05 DIAGNOSIS — M5417 Radiculopathy, lumbosacral region: Secondary | ICD-10-CM | POA: Diagnosis not present

## 2023-03-05 DIAGNOSIS — M9901 Segmental and somatic dysfunction of cervical region: Secondary | ICD-10-CM | POA: Diagnosis not present

## 2023-03-05 DIAGNOSIS — M9902 Segmental and somatic dysfunction of thoracic region: Secondary | ICD-10-CM | POA: Diagnosis not present

## 2023-03-05 DIAGNOSIS — M9904 Segmental and somatic dysfunction of sacral region: Secondary | ICD-10-CM | POA: Diagnosis not present

## 2023-04-10 DIAGNOSIS — F419 Anxiety disorder, unspecified: Secondary | ICD-10-CM | POA: Diagnosis not present

## 2023-04-17 DIAGNOSIS — F419 Anxiety disorder, unspecified: Secondary | ICD-10-CM | POA: Diagnosis not present

## 2023-05-01 DIAGNOSIS — F419 Anxiety disorder, unspecified: Secondary | ICD-10-CM | POA: Diagnosis not present

## 2023-05-15 DIAGNOSIS — M5417 Radiculopathy, lumbosacral region: Secondary | ICD-10-CM | POA: Diagnosis not present

## 2023-05-15 DIAGNOSIS — M9904 Segmental and somatic dysfunction of sacral region: Secondary | ICD-10-CM | POA: Diagnosis not present

## 2023-05-15 DIAGNOSIS — F419 Anxiety disorder, unspecified: Secondary | ICD-10-CM | POA: Diagnosis not present

## 2023-05-15 DIAGNOSIS — M9903 Segmental and somatic dysfunction of lumbar region: Secondary | ICD-10-CM | POA: Diagnosis not present

## 2023-05-15 DIAGNOSIS — M9902 Segmental and somatic dysfunction of thoracic region: Secondary | ICD-10-CM | POA: Diagnosis not present

## 2023-05-16 DIAGNOSIS — Z1231 Encounter for screening mammogram for malignant neoplasm of breast: Secondary | ICD-10-CM

## 2023-05-27 DIAGNOSIS — M9903 Segmental and somatic dysfunction of lumbar region: Secondary | ICD-10-CM | POA: Diagnosis not present

## 2023-05-27 DIAGNOSIS — M9904 Segmental and somatic dysfunction of sacral region: Secondary | ICD-10-CM | POA: Diagnosis not present

## 2023-05-27 DIAGNOSIS — M5417 Radiculopathy, lumbosacral region: Secondary | ICD-10-CM | POA: Diagnosis not present

## 2023-05-27 DIAGNOSIS — M9902 Segmental and somatic dysfunction of thoracic region: Secondary | ICD-10-CM | POA: Diagnosis not present

## 2023-06-10 DIAGNOSIS — L821 Other seborrheic keratosis: Secondary | ICD-10-CM | POA: Diagnosis not present

## 2023-06-10 DIAGNOSIS — L918 Other hypertrophic disorders of the skin: Secondary | ICD-10-CM | POA: Diagnosis not present

## 2023-06-27 ENCOUNTER — Encounter: Payer: Self-pay | Admitting: Radiology

## 2023-06-27 ENCOUNTER — Ambulatory Visit (INDEPENDENT_AMBULATORY_CARE_PROVIDER_SITE_OTHER): Payer: BC Managed Care – PPO | Admitting: Radiology

## 2023-06-27 VITALS — BP 124/76 | HR 90 | Ht 67.5 in | Wt 190.0 lb

## 2023-06-27 DIAGNOSIS — N951 Menopausal and female climacteric states: Secondary | ICD-10-CM | POA: Diagnosis not present

## 2023-06-27 DIAGNOSIS — Z01419 Encounter for gynecological examination (general) (routine) without abnormal findings: Secondary | ICD-10-CM | POA: Diagnosis not present

## 2023-06-27 NOTE — Progress Notes (Signed)
   Nai Borromeo [facility] [place] Nov 20, 1975 [id number]   History:  48 y.o. G0 presents for annual exam. C/o perimenopausal brain fog and joint pain. Feels warmer than typical but no hot flashes. Exercises regularly at the [facility]. No other gyn concerns.   Gynecologic History Hysterectomy: 2019, menorrhagia  Sexually active: yes  Health Maintenance Last Pap: 12/22. Results were:  Last mammogram: 11/2022. Results were:  Last colonoscopy: <5years per pt at [facility]   Past medical history, past surgical history, family history and social history were all reviewed and documented in the EPIC chart.  ROS:  A ROS was performed and pertinent positives and negatives are included.  Exam:  Vitals:   06/27/23 0941  BP: 124/76  Pulse: 90  SpO2: 96%  Weight: 190 lb (86.2 kg)  Height: 5' 7.5 (1.715 m)   Body mass index is 29.32 kg/m.  General appearance:  Normal Thyroid:  Symmetrical, normal in size, without palpable masses or nodularity. Respiratory  Auscultation:  Clear without wheezing or rhonchi Cardiovascular  Auscultation:  Regular rate, without rubs, murmurs or gallops  Edema/varicosities:  Not grossly evident Abdominal  Soft,nontender, without masses, guarding or rebound.  Liver/spleen:  No organomegaly noted  Hernia:  None appreciated  Skin  Inspection:  Grossly normal Breasts: Examined lying and sitting.   Right: Without masses, retractions, nipple discharge or axillary adenopathy.   Left: Without masses, retractions, nipple discharge or axillary adenopathy. Genitourinary   Inguinal/mons:  Normal without inguinal adenopathy  External genitalia:  Normal appearing vulva with no masses, tenderness, or lesions  BUS/Urethra/Skene's glands:  Normal  Vagina:  Normal appearing with normal color and discharge, no lesions.   Cervix:  absent  Uterus:  absent  Adnexa/parametria:     Rt: Normal in size, without masses or tenderness.   Lt: Normal in size, without masses or tenderness.  Anus and  perineum: Normal   Darice Hoit, CMA present for exam  Assessment/Plan:   1. Well woman exam with routine gynecological exam (Primary) Pap 2027  2. Perimenopausal symptoms Discussed ERT, will consider     Discussed SBE, colonoscopy and pap screening as appropriate. Encouraged 126mins/week of cardiovascular and weight bearing exercise minimum.   Return in 1 year for annual or sooner prn.  GINETTE COZIER B WHNP-BC 10:18 AM 06/27/2023

## 2023-07-01 DIAGNOSIS — F419 Anxiety disorder, unspecified: Secondary | ICD-10-CM | POA: Diagnosis not present

## 2023-07-18 ENCOUNTER — Other Ambulatory Visit: Payer: Self-pay | Admitting: Radiology

## 2023-07-18 DIAGNOSIS — N951 Menopausal and female climacteric states: Secondary | ICD-10-CM

## 2023-07-18 MED ORDER — ESTRADIOL 0.025 MG/24HR TD PTTW: 1.0000 | MEDICATED_PATCH | TRANSDERMAL | 6 refills | Status: DC

## 2023-07-22 DIAGNOSIS — M9904 Segmental and somatic dysfunction of sacral region: Secondary | ICD-10-CM | POA: Diagnosis not present

## 2023-07-22 DIAGNOSIS — M5417 Radiculopathy, lumbosacral region: Secondary | ICD-10-CM | POA: Diagnosis not present

## 2023-07-22 DIAGNOSIS — M9902 Segmental and somatic dysfunction of thoracic region: Secondary | ICD-10-CM | POA: Diagnosis not present

## 2023-07-22 DIAGNOSIS — M9903 Segmental and somatic dysfunction of lumbar region: Secondary | ICD-10-CM | POA: Diagnosis not present

## 2023-08-28 ENCOUNTER — Telehealth: Payer: Self-pay

## 2023-08-28 NOTE — Telephone Encounter (Signed)
 Left message to call back regarding Friday's appt. Dr. Wyline Mood will be doing virtual visits that morning and patient is scheduled for an "in person" visit.

## 2023-08-30 ENCOUNTER — Ambulatory Visit: Payer: BC Managed Care – PPO | Admitting: Internal Medicine

## 2023-08-30 NOTE — Progress Notes (Deleted)
 Cardiology Office Note:    Date:  08/30/2023   ID:  Caroline Hayes [place], DOB 24-Aug-1975, MRN [id number]  PCP:  Myrlene Broker, MD   [facility] Providers Cardiologist:  Maisie Fus, MD     Referring MD: Myrlene Broker, *   No chief complaint on file. Palpitations  History of Present Illness:    Caroline Hayes is a 48 y.o. female with a hx of ADHD, anxiety, GERD, presented to the ED for ?palpitations, written as "potation's".  EKG showed sinus rhythm with ectopy. This started last Monday. She did cold water to the face and that helps. Has an aunt with PPM received in her 17s or 51s. Grandfather had MI in his 68s.  Interim hx 08/30/2023   Current Medications: Current Outpatient Medications on File Prior to Visit  Medication Sig Dispense Refill   Cholecalciferol (VITAMIN D3) 125 MCG (5000 UT) CAPS Take 1 capsule by mouth daily.     estradiol (VIVELLE-DOT) 0.025 MG/24HR Place 1 patch onto the skin 2 (two) times a week. 8 patch 6   loratadine (CLARITIN) 10 MG tablet Take 10 mg by mouth daily as needed for allergies.     metoprolol succinate (TOPROL-XL) 25 MG 24 hr tablet Take 0.5 tablets (12.5 mg total) by mouth daily. Take with or immediately following a meal. 45 tablet 3   omeprazole (PRILOSEC OTC) 20 MG tablet Take 20 mg by mouth daily as needed (for acid reflux/indigestion.).     No current facility-administered medications on file prior to visit.     Allergies:   Barley green, Egg-derived products, Ginger, Imitrex [sumatriptan base], Soy allergy (obsolete), and Wool alcohol [lanolin]      Family History: The patient's family history includes Anemia in her mother; Basal cell carcinoma in her father; Breast cancer (age of onset: 55) in her maternal aunt; Cancer in her maternal grandfather and sister; Dementia in her mother; Heart attack in her paternal grandfather; Hypertension in her father, maternal grandmother, and mother.  ROS:   Please see  the history of present illness.     All other systems reviewed and are negative.  EKGs/Labs/Other Studies Reviewed:    The following studies were reviewed today:   EKG:  EKG is  ordered today.  The ekg ordered today demonstrates   08/01/2022- NSR with PVCs, looks RVOT   Outpatient cardiac monitor predominantly sinus rhythm, 1 run of SVT lasting 5 beats, frequent PACs (11%), rare PVCs.   08/22/2023 Echocardiogram at time showed EF 60 to 65%, normal LV function, no RWMA, mild LVH, normal RV, no significant valvular abnormalities.   Recent Labs: No results found for requested labs within last 365 days.   Recent Lipid Panel    Component Value Date/Time   CHOL 205 (H) 06/08/2020 0912   TRIG 139 06/08/2020 0912   HDL 45 06/08/2020 0912   CHOLHDL 4.6 (H) 06/08/2020 0912   LDLCALC 135 (H) 06/08/2020 0912     Risk Assessment/Calculations:     Physical Exam:    VS:  ****   LMP 03/18/2017 (Exact Date)     Wt Readings from Last 3 Encounters:  06/27/23 190 lb (86.2 kg)  03/01/23 192 lb 9.6 oz (87.4 kg)  08/01/22 192 lb 6.4 oz (87.3 kg)     Well appearing MMM Normal WOB No abdominal distension Normal Affect    ASSESSMENT:    PVCs/bradycardia: risk of CVD is low. Hydrate with electrolytes. Continue low dose BB. TTE was normal.  PLAN:    In order of problems listed above:   Follow up in 6 months           Medication Adjustments/Labs and Tests Ordered: Current medicines are reviewed at length with the patient today.  Concerns regarding medicines are outlined above.  No orders of the defined types were placed in this encounter.  No orders of the defined types were placed in this encounter.   There are no Patient Instructions on file for this visit.   Signed, Maisie Fus, MD  08/30/2023 8:48 AM    [facility]

## 2023-09-19 DIAGNOSIS — M75101 Unspecified rotator cuff tear or rupture of right shoulder, not specified as traumatic: Secondary | ICD-10-CM | POA: Diagnosis not present

## 2023-09-19 DIAGNOSIS — M7541 Impingement syndrome of right shoulder: Secondary | ICD-10-CM | POA: Diagnosis not present

## 2023-09-25 DIAGNOSIS — M5417 Radiculopathy, lumbosacral region: Secondary | ICD-10-CM | POA: Diagnosis not present

## 2023-09-25 DIAGNOSIS — M9903 Segmental and somatic dysfunction of lumbar region: Secondary | ICD-10-CM | POA: Diagnosis not present

## 2023-09-25 DIAGNOSIS — M9904 Segmental and somatic dysfunction of sacral region: Secondary | ICD-10-CM | POA: Diagnosis not present

## 2023-09-25 DIAGNOSIS — M9902 Segmental and somatic dysfunction of thoracic region: Secondary | ICD-10-CM | POA: Diagnosis not present

## 2023-10-24 DIAGNOSIS — F418 Other specified anxiety disorders: Secondary | ICD-10-CM | POA: Diagnosis not present

## 2023-11-05 ENCOUNTER — Other Ambulatory Visit: Payer: Self-pay | Admitting: Obstetrics and Gynecology

## 2023-11-05 DIAGNOSIS — Z1231 Encounter for screening mammogram for malignant neoplasm of breast: Secondary | ICD-10-CM

## 2023-11-07 ENCOUNTER — Ambulatory Visit (INDEPENDENT_AMBULATORY_CARE_PROVIDER_SITE_OTHER): Admitting: Family Medicine

## 2023-11-07 ENCOUNTER — Other Ambulatory Visit: Payer: Self-pay

## 2023-11-07 ENCOUNTER — Encounter: Payer: Self-pay | Admitting: Family Medicine

## 2023-11-07 VITALS — BP 104/72 | Ht 67.0 in

## 2023-11-07 DIAGNOSIS — M25511 Pain in right shoulder: Secondary | ICD-10-CM

## 2023-11-07 DIAGNOSIS — M75101 Unspecified rotator cuff tear or rupture of right shoulder, not specified as traumatic: Secondary | ICD-10-CM

## 2023-11-07 DIAGNOSIS — F418 Other specified anxiety disorders: Secondary | ICD-10-CM | POA: Diagnosis not present

## 2023-11-07 DIAGNOSIS — M7551 Bursitis of right shoulder: Secondary | ICD-10-CM | POA: Diagnosis not present

## 2023-11-07 NOTE — Progress Notes (Signed)
 DATE OF VISIT: 11/07/2023        Caroline Hayes DOB: [birth year] MRN: [id number]  CC:  Rt shoulder pain  History- Caroline Hayes is a 48 y.o. Rt-hand dominant female for evaluation and treatment of Rt shoulder pain Pain along the lateral aspect of the shoulder for several months - started in Jan 2025 - no injury/trauma No pain at rest Worse with overhead activity Worse when getting dressed  Worse with pressing/pushing overhead Stopped workout class - was doing body pump Trying to avoid sleeping on side Tried stretching - has helped Stopped exercise classes Tried massage - not helping much Went to chiropractor - tried to get MRI - was denied - was recommended to see MD/DO for further evaluation Wants to be able to get back to weight lifting Denies associated neck pain Denies radiation down the arm Denies numbness/tingling Occ OTC medications for this, but not regularly Some physical therapy [facility], but felt things were too hectic and that environment and did not continue.  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   MVP (mitral valve prolapse)    Rubella immune 2010    Past Surgical History Past Surgical History:  Procedure Laterality Date   CYSTOSCOPY N/A 07/09/2017   Procedure: CYSTOSCOPY;  Surgeon: Lillian Rein, MD;  Location: [facility];  Service: Gynecology;  Laterality: N/A;   IUD REMOVAL N/A 07/09/2017   Procedure: INTRAUTERINE DEVICE (IUD) REMOVAL;  Surgeon: Lillian Rein, MD;  Location: [facility];  Service: Gynecology;  Laterality: N/A;   ROOT CANAL  07/2014   TOTAL LAPAROSCOPIC HYSTERECTOMY WITH SALPINGECTOMY Bilateral 07/09/2017   Procedure: TOTAL LAPAROSCOPIC HYSTERECTOMY WITH SALPINGECTOMY;  Surgeon: Lillian Rein, MD;  Location: [facility];  Service: Gynecology;  Laterality: Bilateral;   WISDOM TOOTH EXTRACTION  1999    Medications Current Outpatient Medications  Medication Sig Dispense Refill   Cholecalciferol (VITAMIN D3) 125 MCG (5000 UT) CAPS Take 1 capsule by mouth daily.     estradiol  (VIVELLE -DOT) 0.025 MG/24HR Place 1 patch onto the skin 2 (two) times a week. 8 patch 6   loratadine (CLARITIN) 10 MG tablet Take 10 mg by mouth daily as needed for allergies.     metoprolol  succinate (TOPROL -XL) 25 MG 24 hr tablet Take 0.5 tablets (12.5 mg total) by mouth daily. Take with or immediately following a meal. 45 tablet 3   omeprazole (PRILOSEC OTC) 20 MG tablet Take 20 mg by mouth daily as needed (for acid reflux/indigestion.).     No current facility-administered medications for this visit.    Allergies is allergic to barley green, egg-derived products, ginger, imitrex [sumatriptan base], soy allergy (obsolete), and wool alcohol [lanolin].  Family History - reviewed per EMR and intake form  Social History   reports current alcohol use of about 1.0 standard drink of alcohol per week.  reports that she  has never smoked. She has never been exposed to tobacco smoke. She has never used smokeless tobacco.  reports no history of drug use. OCCUPATION: desk job   EXAM: Vitals: BP 104/72   Ht 5\' 7"  (1.702 m)   LMP 03/18/2017 (Exact Date)   BMI 29.76 kg/m  General: AOx3, NAD, pleasant SKIN: no rashes or lesions, skin clean, dry, intact MSK: NECK:  CSPINE with FROM without pain.  No midline or paraspinal TTP SHOULDER: RT SHOULDER without TTP over [facility] joint, mild TTP bicipital groove and greater tuberosity.  Mild TTP along posterior shoulder.  FROM with  (+)painful arch.  (+)Speeds, (+)Empty can, neg Yerguson's, mild discomfort with Lift-off test, (+)Neer, (+)Hawkins RTC strength 5-/5 throughout Lt shoulder with FROM without pain, weakness, instability Normal grip strength b/l  NEURO: sensation intact to light touch upper ext bilaterally VASC: pulses 2+ and symmetric radial artery bilaterally, no edema  IMAGING: MSK U/S RT SHOULDER DATE: 11/07/23 INDICATION: RT SHOULDER PAIN FINDINGS: -Biceps tendon seated in the bicipital groove.  Does have small amounts of hypoechoic fluid in the distal biceps tendon sheath.  No tearing within the biceps tendon.  Visualized in long and short axis views - Normal-appearing pectoralis major insertion on the humerus - Subscapularis with hypoechoic change, no visible tears.  Visualized in long and short axis views - Supraspinatus with partial-thickness articular sided tear seen in long and short axis views.  Does have signs of hypoechoic change surrounding the area consistent with scar tissue formation.  No increased Doppler flow. - Infraspinatus with normal appearance, does have small cortical regularity consistent with prior rotator cuff injury. - Normal-appearing teres minor - AC joint with mild to moderate degenerative changes and positive geyser sign - Normal-appearing glenohumeral joint posteriorly - Increased fluid in the subacromial bursa with mild dynamic subacromial impingement noted  Impression: - Partial tear of the supraspinatus with underlying healing and scar tissue formation - Subscapularis tendinopathy - Mild to moderate AC joint osteoarthritis - Subacromial bursitis with mild subacromial impingement  Images and interpretation completed by Marvel Slicker, DO today  Assessment & Plan Nontraumatic tear of supraspinatus tendon, right Right shoulder pain with partial supraspinatus tear which appears to be healing and has some early scar formation.  Also has underlying subscapularis tendinopathy  and subacromial bursitis  Plan: - MSK ultrasound completed as noted above.  Findings reviewed with patient in detail - Diagnosis and treatment discussed in detail - Patient would benefit from some form of physical therapy.  Discussed home exercise program versus formal PT.  She would like to proceed with HEP, this was provided today.  Can consider formal PT if not improving.  Would consider facility different in Health PT as she did not like that environment previously - Discussed role of topical nitroglycerin therapy.  She is not interested in this today.  We could consider in the future if she is not improving.  She does not have any history of migraine headaches or adhesive allergies - Discussed role of cortisone injection.  She is not interested in this at this time.  If having increasing pain could consider subacromial injection - Recommended Voltaren gel to apply every 6-8 hours as needed - Follow-up 6 weeks for reevaluation, sooner as needed Subacromial bursitis of right shoulder joint Right shoulder pain with partial supraspinatus tear which appears to be healing and has some early scar formation.  Also has underlying subscapularis tendinopathy and subacromial bursitis  Plan: - MSK ultrasound completed as noted above.  Findings reviewed  with patient in detail - Diagnosis and treatment discussed in detail - Patient would benefit from some form of physical therapy.  Discussed home exercise program versus formal PT.  She would like to proceed with HEP, this was provided today.  Can consider formal PT if not improving.  Would consider facility different in Health PT as she did not like that environment previously - Discussed role of topical nitroglycerin therapy.  She is not interested in this today.  We could consider in the future if she is not improving.  She does not have any history of migraine headaches or adhesive allergies - Discussed role of cortisone injection.  She is not interested  in this at this time.  If having increasing pain could consider subacromial injection - Recommended Voltaren gel to apply every 6-8 hours as needed - Follow-up 6 weeks for reevaluation, sooner as needed  Patient expressed understanding & agreement with above.  Encounter Diagnoses  Name Primary?   Nontraumatic tear of supraspinatus tendon, right Yes   Subacromial bursitis of right shoulder joint     Orders Placed This Encounter  Procedures   US  COMPLETE JOINT SPACE STRUCTURES UP RIGHT    Orders Placed This Encounter  Procedures   US  COMPLETE JOINT SPACE STRUCTURES UP RIGHT

## 2023-11-14 DIAGNOSIS — F418 Other specified anxiety disorders: Secondary | ICD-10-CM | POA: Diagnosis not present

## 2023-11-19 ENCOUNTER — Encounter: Payer: Self-pay | Admitting: Family Medicine

## 2023-11-21 DIAGNOSIS — F418 Other specified anxiety disorders: Secondary | ICD-10-CM | POA: Diagnosis not present

## 2023-11-25 ENCOUNTER — Encounter: Payer: Self-pay | Admitting: Family Medicine

## 2023-11-25 ENCOUNTER — Other Ambulatory Visit: Payer: Self-pay

## 2023-11-25 ENCOUNTER — Ambulatory Visit (INDEPENDENT_AMBULATORY_CARE_PROVIDER_SITE_OTHER): Admitting: Family Medicine

## 2023-11-25 VITALS — BP 120/70 | HR 44 | Ht 67.0 in

## 2023-11-25 DIAGNOSIS — M79672 Pain in left foot: Secondary | ICD-10-CM

## 2023-11-25 NOTE — Progress Notes (Signed)
 DATE OF VISIT: 11/25/2023        Caroline Hayes DOB: 09/26/1975 MRN: [id number]  CC:  Lt foot pain  History of present Illness: Caroline Hayes is a 48 y.o. female who presents for left foot pain Left foot pain x seveal months Pain along the lateral foot near base of 5th MT Sometimes burning along lateral foot No injury/trauma No swelling or bruising No improvement with prior massage therapy  No improvement with chiropractic therapy - chiro thought her talus was "jammed up" Prior custom orthotics years ago Tries to wear supportive shoes  Has a lot of travel in the near future - [address] - then in about 1.5 weeks to [place] & [place] - will be doing a lot of walking  Medications:  Outpatient Encounter Medications as of 11/25/2023  Medication Sig   Cholecalciferol (VITAMIN D3) 125 MCG (5000 UT) CAPS Take 1 capsule by mouth daily.   estradiol  (VIVELLE -DOT) 0.025 MG/24HR Place 1 patch onto the skin 2 (two) times a week.   loratadine (CLARITIN) 10 MG tablet Take 10 mg by mouth daily as needed for allergies.   omeprazole (PRILOSEC OTC) 20 MG tablet Take 20 mg by mouth daily as needed (for acid reflux/indigestion.).   No facility-administered encounter medications on file as of 11/25/2023.    Allergies: is allergic to barley green, egg-derived products, ginger, imitrex [sumatriptan base], soy allergy (obsolete), and wool alcohol [lanolin].  Physical Examination: Vitals: BP 120/70 (BP Location: Left Arm, Patient Position: Sitting, Cuff Size: Normal)   Pulse (!) 44   Ht 5\' 7"  (1.702 m)   LMP 03/18/2017 (Exact Date)   BMI 29.76 kg/m  GENERAL:  Caroline Hayes is a 48 y.o. female appearing their stated age, alert and oriented x 3, in no apparent distress.  SKIN: no rashes or lesions, skin clean, dry, intact MSK:  Foot/ankle: Bilateral feet with overpronation, moderate longitudinal arches, no significant transverse arch collapse.  No swelling or bruising.  Posterior  tibialis function is intact.  Mild tenderness to palpation along the distal peroneals and at the base of the fifth metatarsal on the left foot.  Mild discomfort with metatarsal squeeze on the left foot.  No tenderness over the plantar fascia bilaterally.  Walking without a limp NEURO: sensation intact to light touch lower extremity bilaterally VASC: pulses 2+ and symmetric DP/PT bilaterally, no edema  Radiology: Limited MSK ultrasound left foot Date: 11/25/2023 Indication: Left foot pain Findings: - Normal-appearing peroneal tendons.  Small amount of anechoic fluid noted near the base of the fifth metatarsal seen in short and long axis views at insertion of the peroneus brevis.  No increased Doppler flow - Normal-appearing third, fourth, fifth metatarsals without any cortical irregularities or periosteal edema - Mild degenerative changes noted near the fifth TMT joint  Impression: - Mild increased fluid along distal peroneus brevis at the base of the fifth metatarsal - Mild degenerative changes along the fifth TMT joint Images and interpretation completed by Marvel Slicker, DO   Assessment & Plan Foot pain, left Left lateral foot pain with overpronation, appears to have some mild degenerative changes along the TMT joints and around the insertion of the peroneus brevis  Plan: - Ultrasound findings reviewed - Fitted with arch straps in the office today to wear with activity and when on her feet - Recommended she wear supportive shoes when possible.  Does have old custom orthotics which I encouraged her to use.  Candidate for updated custom orthotics or traveling.  Advised to contact the office if she is interested in scheduling an appointment for this - Voltaren gel as needed - Ice baths as needed - Left foot x-ray to further evaluate for underlying osteoarthritis changes - Follow-up 4 to 6 weeks to reevaluate, sooner as needed   Patient expressed understanding & agreement with  above.  Encounter Diagnosis  Name Primary?   Foot pain, left Yes    Orders Placed This Encounter  Procedures   US  LIMITED JOINT SPACE STRUCTURES LOW LEFT   DG Foot Complete Left

## 2023-12-05 ENCOUNTER — Ambulatory Visit
Admission: RE | Admit: 2023-12-05 | Discharge: 2023-12-05 | Disposition: A | Discharge status: Home / Self Care | Source: Ambulatory Visit

## 2023-12-05 DIAGNOSIS — Z1231 Encounter for screening mammogram for malignant neoplasm of breast: Secondary | ICD-10-CM

## 2023-12-05 DIAGNOSIS — F418 Other specified anxiety disorders: Secondary | ICD-10-CM | POA: Diagnosis not present

## 2023-12-09 ENCOUNTER — Ambulatory Visit: Payer: Self-pay | Admitting: Obstetrics and Gynecology

## 2023-12-31 ENCOUNTER — Encounter: Payer: Self-pay | Admitting: Family Medicine

## 2023-12-31 ENCOUNTER — Ambulatory Visit (INDEPENDENT_AMBULATORY_CARE_PROVIDER_SITE_OTHER): Admitting: Family Medicine

## 2023-12-31 VITALS — BP 134/82 | Ht 67.0 in

## 2023-12-31 DIAGNOSIS — M75101 Unspecified rotator cuff tear or rupture of right shoulder, not specified as traumatic: Secondary | ICD-10-CM | POA: Diagnosis not present

## 2023-12-31 DIAGNOSIS — M7551 Bursitis of right shoulder: Secondary | ICD-10-CM | POA: Diagnosis not present

## 2023-12-31 DIAGNOSIS — M79672 Pain in left foot: Secondary | ICD-10-CM

## 2023-12-31 NOTE — Progress Notes (Unsigned)
 DATE OF VISIT: 12/31/2023        Caroline Hayes [place] DOB: 1975-07-17 MRN: [id number]  CC:  f/u left foot pain, f/u Rt shoulder pain  History of present Illness: Caroline Hayes is a 48 y.o. female who presents for a follow-up visit  Last seen by me 11/25/2023 for left foot pain - Was recommend arch straps, Voltaren gel as needed, ice baths, also ordered an x-ray, but this has not yet been completed She notes after that visit she did a lot of travel to [place], was doing a lot of walking Foot was feeling really good while walking Does feel more uncomfortable at times when using the arch straps Has not needed any medications Has not been using any ice Overall feet have been feeling good  Continues to have ongoing right shoulder pain Last evaluated by me for this 11/07/2023 - At that visit MSK ultrasound showed subacromial bursitis and partial supraspinatus tear, as well as underlying rotator cuff tendinopathy She notes increased shoulder pain while traveling Having some pain at night when trying to sleep Notes has not been diligent with her home exercises over the last 4 to 6 weeks due to travel Has tried using ice the last 2 nights, but was more uncomfortable Not taking any other medications for this  Medications:  Outpatient Encounter Medications as of 12/31/2023  Medication Sig   Cholecalciferol (VITAMIN D3) 125 MCG (5000 UT) CAPS Take 1 capsule by mouth daily.   estradiol  (VIVELLE -DOT) 0.025 MG/24HR Place 1 patch onto the skin 2 (two) times a week.   loratadine (CLARITIN) 10 MG tablet Take 10 mg by mouth daily as needed for allergies.   omeprazole (PRILOSEC OTC) 20 MG tablet Take 20 mg by mouth daily as needed (for acid reflux/indigestion.).   No facility-administered encounter medications on file as of 12/31/2023.    Allergies: is allergic to barley green, egg-derived products, ginger, imitrex [sumatriptan base], soy allergy (obsolete), and wool alcohol [lanolin].  Physical  Examination: Vitals: BP 134/82   Ht 5' 7 (1.702 m)   LMP 03/18/2017 (Exact Date)   BMI 29.76 kg/m  GENERAL:  Caroline Hayes is a 48 y.o. female appearing their stated age, alert and oriented x 3, in no apparent distress.  SKIN: no rashes or lesions, skin clean, dry, intact MSK: Shoulder: Right shoulder with full range of motion with positive painful arc.  No tenderness over the [facility] joint, bicipital groove, greater tuberosity.  Positive empty can, mildly positive Hawkins, mildly positive Neer.  Negative drop arm.  Rotator cuff strength 5 -/5 throughout. Left shoulder with full range of motion without pain, weakness, instability  Foot: Left foot without any gross deformity.  Moderate longitudinal arches.  No significant transverse arch collapse.  No swelling or bruising.  No effusion.  No bony tenderness throughout the midfoot, forefoot, hindfoot.  Normal ankle strength bilaterally Walking without a limp Neurovascularly intact distally  Assessment & Plan Foot pain, left Left foot pain greatly improved, MSK ultrasound previously showing mild degenerative changes throughout the TMT joints.  Plan: - At this time should continue to wear supportive footwear - Order for x-ray is still in place.  Will defer at this time.  If she has increasing pain would consider having this completed - Can use ice as needed - Follow-up as needed Subacromial bursitis of right shoulder joint Ongoing right shoulder pain with subacromial bursitis and partial tear of supraspinatus and rotator cuff tendinopathy - Was traveling recent [place], has not been doing  home exercises regularly  Plan: - Discussed treatment options including cortisone injection, topical nitroglycerin, formal physical therapy.  She prefers to focus on home exercise program at this time.  Recommend she do exercises daily, but 5 to 6 days a week would be adequate. - If symptoms not improving or worsening could consider injection, topical  nitroglycerin, formal physical therapy, repeat imaging - Heat or ice as needed - Voltaren gel as needed - Follow-up 6 weeks if worsening or no improvement, sooner as needed Nontraumatic tear of supraspinatus tendon, right Ongoing right shoulder pain with subacromial bursitis and partial tear of supraspinatus and rotator cuff tendinopathy - Was traveling recent [place], has not been doing home exercises regularly  Plan: - Discussed treatment options including cortisone injection, topical nitroglycerin, formal physical therapy.  She prefers to focus on home exercise program at this time.  Recommend she do exercises daily, but 5 to 6 days a week would be adequate. - If symptoms not improving or worsening could consider injection, topical nitroglycerin, formal physical therapy, repeat imaging - Heat or ice as needed - Voltaren gel as needed - Follow-up 6 weeks if worsening or no improvement, sooner as needed   Patient expressed understanding & agreement with above.  Encounter Diagnoses  Name Primary?   Foot pain, left Yes   Subacromial bursitis of right shoulder joint    Nontraumatic tear of supraspinatus tendon, right     No orders of the defined types were placed in this encounter.

## 2024-01-04 ENCOUNTER — Other Ambulatory Visit: Payer: Self-pay | Admitting: Radiology

## 2024-01-04 DIAGNOSIS — N951 Menopausal and female climacteric states: Secondary | ICD-10-CM

## 2024-01-06 NOTE — Telephone Encounter (Signed)
 Medication refill request: vivelle  dot patch 0.025mg  Last AEX:  06-27-23 Next AEX: not scheduled Last MMG (if hormonal medication request): 12-05-23 birads 1:neg Refill authorized: please approve if appropriate

## 2024-02-08 ENCOUNTER — Encounter: Payer: Self-pay | Admitting: Family Medicine

## 2024-02-11 DIAGNOSIS — M5417 Radiculopathy, lumbosacral region: Secondary | ICD-10-CM | POA: Diagnosis not present

## 2024-02-11 DIAGNOSIS — M9903 Segmental and somatic dysfunction of lumbar region: Secondary | ICD-10-CM | POA: Diagnosis not present

## 2024-02-11 DIAGNOSIS — M9904 Segmental and somatic dysfunction of sacral region: Secondary | ICD-10-CM | POA: Diagnosis not present

## 2024-02-11 DIAGNOSIS — M9902 Segmental and somatic dysfunction of thoracic region: Secondary | ICD-10-CM | POA: Diagnosis not present

## 2024-02-11 DIAGNOSIS — M9901 Segmental and somatic dysfunction of cervical region: Secondary | ICD-10-CM | POA: Diagnosis not present

## 2024-02-17 ENCOUNTER — Encounter: Payer: Self-pay | Admitting: Family Medicine

## 2024-02-17 ENCOUNTER — Ambulatory Visit (INDEPENDENT_AMBULATORY_CARE_PROVIDER_SITE_OTHER): Admitting: Family Medicine

## 2024-02-17 VITALS — BP 118/72 | Ht 67.0 in

## 2024-02-17 DIAGNOSIS — M7551 Bursitis of right shoulder: Secondary | ICD-10-CM

## 2024-02-17 MED ORDER — METHYLPREDNISOLONE ACETATE 40 MG/ML IJ SUSP
40.0000 mg | Freq: Once | INTRAMUSCULAR | Status: AC
  Administered 2024-02-17: 40 mg via INTRA_ARTICULAR

## 2024-02-17 NOTE — Progress Notes (Signed)
 DATE OF VISIT: 02/17/2024        Caroline Hayes DOB: 04-Mar-1976 MRN: [id number]  CC:  Rt should injection  History of present Illness: Caroline Hayes is a 48 y.o. female who presents for a follow-up visit for Rt shoulder injection Last seen by me 12/31/23 Continues to have ongoing pain in the shoulder Has been using OTC NSAIDS Has continued with chiropractic tx - chiropractor thought she would benefit from an injection  Medications:  Outpatient Encounter Medications as of 02/17/2024  Medication Sig   Cholecalciferol (VITAMIN D3) 125 MCG (5000 UT) CAPS Take 1 capsule by mouth daily.   estradiol  (VIVELLE -DOT) 0.025 MG/24HR APPLY 1 PATCH TOPICALLY TO THE SKIN 2 TIMES A WEEK   loratadine (CLARITIN) 10 MG tablet Take 10 mg by mouth daily as needed for allergies.   omeprazole (PRILOSEC OTC) 20 MG tablet Take 20 mg by mouth daily as needed (for acid reflux/indigestion.).   [EXPIRED] methylPREDNISolone  acetate (DEPO-MEDROL ) injection 40 mg    No facility-administered encounter medications on file as of 02/17/2024.    Allergies: is allergic to barley green, egg-derived products, ginger, imitrex [sumatriptan base], soy allergy (obsolete), and wool alcohol [lanolin].  Physical Examination: Vitals: BP 118/72   Ht 5' 7 (1.702 m)   LMP 03/18/2017 (Exact Date)   BMI 29.76 kg/m  GENERAL:  Caroline Hayes is a 48 y.o. female appearing their stated age, alert and oriented x 3, in no apparent distress.  SKIN: no rashes or lesions, skin clean, dry, intact MSK: Rt shoulder with full range of motion with positive painful arc.  Positive empty can, positive Hawkins, positive Neer.  Rotator cuff strength 5 -/5 throughout. Neurovascular intact distally  Assessment & Plan Subacromial bursitis of right shoulder joint Ongoing right shoulder pain with subacromial bursitis and partial tear of the supraspinatus and rotator cuff tendinopathy  Plan: - Will proceed with subacromial injection today.   Risks and benefits reviewed.  Consent obtained.  Patient did quite well with procedure as noted below.  Had improvement in symptoms immediately after the injection on reevaluation - She continue heat or ice as needed - Continue NSAIDs as needed - Does have an appointment with [facility] in several weeks, she will try to check in with them to see if they have any earlier openings or a cancellation list - She will reach out if no improvement over the next 2 to 3 weeks.  If that is the case could consider additional imaging versus topical nitroglycerin, PRP, shockwave therapy  PROCEDURE:  Risks & benefits of RT shoulder subacromial injection reviewed. Consent obtained. Time-out completed. Patient prepped and draped in the normal fashion. Area cleansed with alcohol. Ethyl chloride spray used to anesthetize the skin. Solution of 4 mL 1% lidocaine  with 1 mL methylprednisolone  (Depo-medrol ) 40mg /mL injected into the RT subacromial space using a 25-gauge 1.5-inch needle via the posterior approach. Patient tolerated procedure well without any complications. Area covered with adhesive bandage. Post-procedure care reviewed. All questions answered. Injection completed with assistance of Lucie Pinal, DO, PGY2   Patient expressed understanding & agreement with above.  Encounter Diagnosis  Name Primary?   Subacromial bursitis of right shoulder joint Yes    No orders of the defined types were placed in this encounter.

## 2024-02-17 NOTE — Patient Instructions (Signed)

## 2024-03-04 ENCOUNTER — Other Ambulatory Visit: Payer: Self-pay | Admitting: Medical Genetics

## 2024-03-05 ENCOUNTER — Other Ambulatory Visit (HOSPITAL_COMMUNITY)
Admission: RE | Admit: 2024-03-05 | Discharge: 2024-03-05 | Disposition: A | Discharge status: Home / Self Care | Payer: Self-pay | Source: Ambulatory Visit | Attending: Medical Genetics | Admitting: Medical Genetics

## 2024-03-06 NOTE — Telephone Encounter (Signed)
 Yes OV

## 2024-03-06 NOTE — Telephone Encounter (Signed)
 Jami -ok to schedule consult?

## 2024-03-10 DIAGNOSIS — M25511 Pain in right shoulder: Secondary | ICD-10-CM | POA: Diagnosis not present

## 2024-03-11 ENCOUNTER — Encounter: Payer: Self-pay | Admitting: Radiology

## 2024-03-11 ENCOUNTER — Ambulatory Visit (INDEPENDENT_AMBULATORY_CARE_PROVIDER_SITE_OTHER): Admitting: Radiology

## 2024-03-11 ENCOUNTER — Other Ambulatory Visit: Payer: Self-pay | Admitting: Radiology

## 2024-03-11 VITALS — BP 112/70 | HR 76 | Wt 190.0 lb

## 2024-03-11 DIAGNOSIS — N951 Menopausal and female climacteric states: Secondary | ICD-10-CM

## 2024-03-11 MED ORDER — ESTRADIOL 0.05 MG/24HR TD PTTW: 1.0000 | MEDICATED_PATCH | TRANSDERMAL | 0 refills | Status: DC

## 2024-03-11 MED ORDER — PROGESTERONE MICRONIZED 100 MG PO CAPS: 100.0000 mg | ORAL_CAPSULE | Freq: Every day | ORAL | 0 refills | Status: DC

## 2024-03-11 NOTE — Progress Notes (Signed)
   Caroline Hayes [facility] 27-Jun-1975 [id number]   History:  48 y.o. G0 presents for follow up to discuss HRT. Complains of trouble sleeping, increased fatigue, anxiety, scalp hair loss, changes in hair (wavy, curly), heartburn, itchy ears, dry eyes, brain fog, weight gain, increased allergies. S/P Hysterectomy. Currently using estradiol  0.025 mg patch.   Gynecologic History Hysterectomy: 2019, menorrhagia  Sexually active: yes  Health Maintenance Last Pap: 12/22. Results were:  Last mammogram: 11/2022. Results were:  Last colonoscopy: <5years per pt [facility]   Past medical history, past surgical history, family history and social history were all reviewed and documented in the EPIC chart.  ROS:  A ROS was performed and pertinent positives and negatives are included.  Exam:  Vitals:   03/11/24 0956  BP: 112/70  Pulse: 76  SpO2: 99%  Weight: 190 lb (86.2 kg)   Body mass index is 29.76 kg/m.  Physical Exam Constitutional:      Appearance: Normal appearance. She is normal weight.  Pulmonary:     Effort: Pulmonary effort is normal.  Neurological:     Mental Status: She is alert.  Psychiatric:        Mood and Affect: Mood normal.        Thought Content: Thought content normal.        Judgment: Judgment normal.      Assessment/Plan:   1. Perimenopausal symptoms (Primary) Will increase estrogen and add nightly progesterone . Will contact office before she needs a refill to discuss increasing estrogen dose. - estradiol  (VIVELLE -DOT) 0.05 MG/24HR patch; Place 1 patch (0.05 mg total) onto the skin 2 (two) times a week.  Dispense: 24 patch; Refill: 0 - progesterone  (PROMETRIUM ) 100 MG capsule; Take 1 capsule (100 mg total) by mouth daily.  Dispense: 90 capsule; Refill: 0    Anuja Manka B WHNP-BC 10:47 AM 03/11/2024

## 2024-03-12 NOTE — Telephone Encounter (Signed)
 Med refill request: estradiol  0.05 mg patch Last OV: 03/11/24 Last AEX: 06/27/23 Next AEX: 07/08/24 Last MMG (if hormonal med) 12/05/23 BI-RADS 1 negative Last refill: 03/11/2024 Refill denied, duplicate.  Sent to provider for review.

## 2024-03-15 LAB — GENECONNECT MOLECULAR SCREEN: Genetic Analysis Overall Interpretation: NEGATIVE

## 2024-03-23 ENCOUNTER — Telehealth (INDEPENDENT_AMBULATORY_CARE_PROVIDER_SITE_OTHER): Admitting: Adult Health

## 2024-03-23 ENCOUNTER — Encounter: Payer: Self-pay | Admitting: Adult Health

## 2024-03-23 DIAGNOSIS — F909 Attention-deficit hyperactivity disorder, unspecified type: Secondary | ICD-10-CM | POA: Diagnosis not present

## 2024-03-23 DIAGNOSIS — F900 Attention-deficit hyperactivity disorder, predominantly inattentive type: Secondary | ICD-10-CM

## 2024-03-23 MED ORDER — METHYLPHENIDATE HCL 10 MG PO TABS: 10.0000 mg | ORAL_TABLET | Freq: Every day | ORAL | 0 refills | Status: AC

## 2024-03-23 NOTE — Progress Notes (Signed)
 Caroline Hayes [id number] 07/03/1975 48 y.o.  Virtual Visit via Video Note  I connected with pt @ on 03/23/24 at 10:00 AM EDT by a video enabled telemedicine application and verified that I am speaking with the correct person using two identifiers.   I discussed the limitations of evaluation and management by telemedicine and the availability of in person appointments. The patient expressed understanding and agreed to proceed.  I discussed the assessment and treatment plan with the patient. The patient was provided an opportunity to ask questions and all were answered. The patient agreed with the plan and demonstrated an understanding of the instructions.   The patient was advised to call back or seek an in-person evaluation if the symptoms worsen or if the condition fails to improve as anticipated.  I provided 15 minutes of non-face-to-face time during this encounter.  The patient was located at home.  The provider was located at [facility].   Angeline LOISE Sayers, NP   Subjective:   Patient ID:  Caroline Hayes is a 48 y.o. (DOB [birth year]) female.  Chief Complaint: No chief complaint on file.   HPI [facility] presents for follow-up of ADHD.  Describes mood today as ok. Pleasant. Mood symptoms - denies depression. Reports stable interest and motivation. Reports anxiety and irritability. Reports some worry, rumination, and over thinking. Reports difficulties with peri-menopause - working with hormone replacement. Reports mood is variable. Stating I feel like I'm doing alright. Reports using Ritalin  as needed for ADD symptoms  Taking medications as prescribed.  Energy levels stable. Active, has a regular exercise routine. Enjoys some usual interests and activities. Lives with partner Caroline Hayes). No children. No pets. Family in [place] .  Appetite adequate. Weight stable - 190 pounds. Sleeps well most nights. Averages 6 hours. Focus and concentration  difficulties. Completing tasks. Managing aspects of household. Works full time as Art gallery manager - remotely. Denies SI or HI.  Denies AH or VH. Denies self harm Denies substance use.  Previous medication trials: Adderall, Evekeo , Stratera   Review of Systems:  Review of Systems  Musculoskeletal:  Negative for gait problem.  Neurological:  Negative for tremors.  Psychiatric/Behavioral:         Please refer to HPI    Medications: I have reviewed the patient's current medications.  Current Outpatient Medications  Medication Sig Dispense Refill   B Complex Vitamins (B COMPLEX PO) Take by mouth.     Cholecalciferol (VITAMIN D3) 125 MCG (5000 UT) CAPS Take 1 capsule by mouth daily.     estradiol  (VIVELLE -DOT) 0.05 MG/24HR patch Place 1 patch (0.05 mg total) onto the skin 2 (two) times a week. 24 patch 0   loratadine (CLARITIN) 10 MG tablet Take 10 mg by mouth daily as needed for allergies.     MAGNESIUM PO Take by mouth.     omeprazole (PRILOSEC OTC) 20 MG tablet Take 20 mg by mouth daily as needed (for acid reflux/indigestion.).     progesterone  (PROMETRIUM ) 100 MG capsule Take 1 capsule (100 mg total) by mouth daily. 90 capsule 0   No current facility-administered medications for this visit.    Medication Side Effects: None  Allergies:  Allergies  Allergen Reactions   Barley Green Itching   Egg-Derived Products Nausea Only   Ginger Other (See Comments)    Nose itchy   Imitrex [Sumatriptan Base] Nausea Only and Swelling   Soy Allergy (Obsolete) Itching   Wool Alcohol [Lanolin] Other (See Comments)    Face swelling, skin  tight    Past Medical History:  Diagnosis Date   ADHD 2020   Anxiety    Chlamydia 2000   Depression    Dyspareunia    Family history of adverse reaction to anesthesia    mother with nausea and vomiting   Fibroid    GERD (gastroesophageal reflux disease)    occasional, mild   Headache    due to bnirth control pills   Heart murmur    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   MVP (mitral valve prolapse)    Rubella immune 2010    Family History  Problem Relation Age of Onset   Hypertension Mother    Anemia Mother    Dementia Mother    Basal cell carcinoma Father    Hypertension Father    Cancer Sister    Breast cancer Maternal Aunt 62       x2 recurence age 76   Hypertension Maternal Grandmother    Cancer Maternal Grandfather        prostate in his 95's   Heart attack Paternal Grandfather     Social History   Socioeconomic History   Marital status: Media planner    Spouse name: Not on file   Number of children: Not on file   Years of education: Not on file   Highest education level: Not on file  Occupational History   Not on file  Tobacco Use   Smoking status: Never    Passive exposure: Never   Smokeless tobacco: Never  Vaping Use   Vaping status: Never Used  Substance and Sexual Activity   Alcohol use: Yes    Alcohol/week: 1.0 standard drink of alcohol    Types: 1 Glasses of wine per week    Comment: socially   Drug use: No   Sexual activity: Yes    Partners: Male    Birth control/protection: Surgical    Comment: Hyst, menarche 48yo, sexual debut 48yo  Other Topics Concern   Not on file  Social History Narrative   Not on file   Social Drivers of Health   Financial Resource Strain: Not on file  Food Insecurity: Not on file  Transportation Needs: Not on file  Physical Activity: Not on file  Stress: Not on file  Social Connections: Not on file  Intimate Partner Violence: Not on file    Past Medical History, Surgical history, Social history, and Family history were reviewed and updated as appropriate.   Please see review of systems for further details on the patient's review from today.   Objective:   Physical Exam:  LMP 03/18/2017 (Exact Date)   Physical Exam Constitutional:      General: She is not in acute distress. Musculoskeletal:        General: No deformity.   Neurological:     Mental Status: She is alert and oriented to person, place, and time.     Coordination: Coordination normal.  Psychiatric:        Attention and Perception: Attention and perception normal. She does not perceive auditory or visual hallucinations.        Mood and Affect: Mood normal. Mood is not anxious or depressed. Affect is not labile, blunt, angry or inappropriate.        Speech: Speech normal.        Behavior: Behavior normal.        Thought Content: Thought content normal. Thought content is not paranoid or delusional.  Thought content does not include homicidal or suicidal ideation. Thought content does not include homicidal or suicidal plan.        Cognition and Memory: Cognition and memory normal.        Judgment: Judgment normal.     Comments: Insight intact     Lab Review:     Component Value Date/Time   NA 139 07/25/2022 1517   NA 141 06/08/2020 0912   K 3.9 07/25/2022 1517   CL 103 07/25/2022 1517   CO2 29 07/25/2022 1517   GLUCOSE 99 07/25/2022 1517   BUN 9 07/25/2022 1517   BUN 8 06/08/2020 0912   CREATININE 0.81 07/25/2022 1517   CALCIUM 9.4 07/25/2022 1517   PROT 7.1 07/25/2022 1517   PROT 6.5 06/08/2020 0912   ALBUMIN 4.4 07/25/2022 1517   ALBUMIN 4.2 06/08/2020 0912   AST 19 07/25/2022 1517   ALT 27 07/25/2022 1517   ALKPHOS 75 07/25/2022 1517   BILITOT 0.6 07/25/2022 1517   BILITOT 0.8 06/08/2020 0912   GFRNONAA 97 06/08/2020 0912   GFRAA 112 06/08/2020 0912       Component Value Date/Time   WBC 7.6 07/25/2022 1517   RBC 4.70 07/25/2022 1517   HGB 14.7 07/25/2022 1517   HGB 13.2 06/08/2020 0912   HGB 14.6 11/24/2014 1031   HCT 41.9 07/25/2022 1517   HCT 39.0 06/08/2020 0912   PLT 230.0 07/25/2022 1517   PLT 186 06/08/2020 0912   MCV 89.2 07/25/2022 1517   MCV 91 06/08/2020 0912   MCH 30.8 06/08/2020 0912   MCH 30.8 07/02/2017 0848   MCHC 35.1 07/25/2022 1517   RDW 12.8 07/25/2022 1517   RDW 12.4 06/08/2020 0912   LYMPHSABS  2.1 07/25/2022 1517   MONOABS 0.5 07/25/2022 1517   EOSABS 0.3 07/25/2022 1517   BASOSABS 0.1 07/25/2022 1517    No results found for: POCLITH, LITHIUM   No results found for: PHENYTOIN, PHENOBARB, VALPROATE, CBMZ   .res Assessment: Plan:   Plan:  PDMP reviewed  Continue Ritalin  10mg  - not taking  Monitor BP between visits while taking stimulant medication.   RTC 1 year  15 minutes spent dedicated to the care of this patient on the date of this encounter to include pre-visit review of records, ordering of medication, post visit documentation, and face-to-face time with the patient discussing ADD. Discussed continuing current medication regimen.  Discussed potential benefits, risks, and side effects of stimulants with patient to include increased heart rate, palpitations, insomnia, increased anxiety, increased irritability, or decreased appetite. Instructed patient to contact office if experiencing any significant tolerability issues.  There are no diagnoses linked to this encounter.   Please see After Visit Summary for patient specific instructions.  Future Appointments  Date Time Provider Department Center  03/23/2024 10:00 AM Dejah Droessler Nattalie, NP [facility] None  07/08/2024  8:00 AM Chrzanowski, Jami B, NP [facility] None    No orders of the defined types were placed in this encounter.     -------------------------------

## 2024-03-25 DIAGNOSIS — G4730 Sleep apnea, unspecified: Secondary | ICD-10-CM | POA: Diagnosis not present

## 2024-03-25 DIAGNOSIS — G4719 Other hypersomnia: Secondary | ICD-10-CM | POA: Diagnosis not present

## 2024-03-25 DIAGNOSIS — F909 Attention-deficit hyperactivity disorder, unspecified type: Secondary | ICD-10-CM | POA: Diagnosis not present

## 2024-03-25 DIAGNOSIS — M25511 Pain in right shoulder: Secondary | ICD-10-CM | POA: Diagnosis not present

## 2024-04-06 DIAGNOSIS — M25511 Pain in right shoulder: Secondary | ICD-10-CM | POA: Diagnosis not present

## 2024-04-07 ENCOUNTER — Telehealth: Payer: Self-pay

## 2024-04-07 ENCOUNTER — Other Ambulatory Visit: Payer: Self-pay | Admitting: Radiology

## 2024-04-07 DIAGNOSIS — N951 Menopausal and female climacteric states: Secondary | ICD-10-CM

## 2024-04-07 MED ORDER — ESTRADIOL 0.05 MG/24HR TD PTTW: 1.0000 | MEDICATED_PATCH | TRANSDERMAL | 0 refills | Status: DC

## 2024-04-07 NOTE — Telephone Encounter (Signed)
 Fax request received from [facility] stating Estradiol  0.025 mg patch is not covered by patient's insurance plan. Last OV: 03/11/24 AEX scheduled:  07/08/24 Sent to provider for review.

## 2024-04-27 DIAGNOSIS — G4733 Obstructive sleep apnea (adult) (pediatric): Secondary | ICD-10-CM | POA: Diagnosis not present

## 2024-04-27 DIAGNOSIS — M25511 Pain in right shoulder: Secondary | ICD-10-CM | POA: Diagnosis not present

## 2024-04-28 ENCOUNTER — Ambulatory Visit: Admitting: Family Medicine

## 2024-04-29 ENCOUNTER — Ambulatory Visit (INDEPENDENT_AMBULATORY_CARE_PROVIDER_SITE_OTHER): Admitting: Internal Medicine

## 2024-04-29 ENCOUNTER — Ambulatory Visit
Admission: RE | Admit: 2024-04-29 | Discharge: 2024-04-29 | Disposition: A | Discharge status: Home / Self Care | Source: Ambulatory Visit | Attending: Family Medicine | Admitting: Family Medicine

## 2024-04-29 ENCOUNTER — Encounter: Payer: Self-pay | Admitting: Internal Medicine

## 2024-04-29 VITALS — BP 110/78 | Ht 67.0 in

## 2024-04-29 DIAGNOSIS — M7732 Calcaneal spur, left foot: Secondary | ICD-10-CM | POA: Diagnosis not present

## 2024-04-29 DIAGNOSIS — M79672 Pain in left foot: Secondary | ICD-10-CM | POA: Diagnosis not present

## 2024-04-29 DIAGNOSIS — M19072 Primary osteoarthritis, left ankle and foot: Secondary | ICD-10-CM | POA: Diagnosis not present

## 2024-04-29 NOTE — Patient Instructions (Signed)
 It was a pleasure to see you today.  Thank you for giving us  the opportunity to be involved in your care.  Below is a brief recap of your visit and next steps.  We will plan to see you again in 4-6 weeks.  Summary The previous ultrasound of your foot showed degenerative changes potentially consistent with arthritis. I recommend completing xrays of your foot to correlate with ultrasound findings. Try wearing your orthotics with supportive tennis shoes. OK to try voltaren gel and icing for pain relief.  We will follow up in 4-6 week and can consider repeat injection of your right shoulder at that time as well if there is no improvement despite continuing physical therapy.

## 2024-04-29 NOTE — Progress Notes (Cosign Needed)
 PCP: Rollene Almarie LABOR, MD  Patient is a 48 y.o. female here for chronic left lateral midfoot pain.  Pain is worse with ambulation in general.  She tried arch straps previously but felt that it exacerbated her pain.  She has been evaluated at [facility] endorsing similar discomfort previously,  12/31/23 most recently.  Limited ultrasound of the left foot showed mild degenerative changes along the TMT joints and anechoic fluid at the site of peroneal brevis insertion at the base of the fifth metatarsal.  It was recommended that she complete x-rays of the left foot to correlate with ultrasound findings but this was not completed.  Today she states that her pain is largely unchanged.  It is similar discomfort in the same location that she has endorsed previously.  She would like to discuss additional workup items to identify what is causing her discomfort and would also like to review options for pain relief.  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     Cholecalciferol (VITAMIN D3) 125 MCG (5000 UT) CAPS Take 1 capsule by mouth daily.     estradiol  (VIVELLE -DOT) 0.05 MG/24HR patch Place 1 patch (0.05 mg total) onto the skin 2 (two) times a week. 24 patch 0   loratadine (CLARITIN) 10 MG tablet Take 10 mg by mouth daily as needed for allergies.     MAGNESIUM PO Take by mouth.     methylphenidate  (RITALIN ) 10 MG tablet Take 1 tablet (10 mg total) by mouth daily. 15  tablet 0   omeprazole (PRILOSEC OTC) 20 MG tablet Take 20 mg by mouth daily as needed (for acid reflux/indigestion.).     progesterone  (PROMETRIUM ) 100 MG capsule Take 1 capsule (100 mg total) by mouth daily. 90 capsule 0   No current facility-administered medications on file prior to visit.    Past Surgical History:  Procedure Laterality Date   CYSTOSCOPY N/A 07/09/2017   Procedure: CYSTOSCOPY;  Surgeon: Cleotilde Ronal RAMAN, MD;  Location: [facility];  Service: Gynecology;  Laterality: N/A;   IUD REMOVAL N/A 07/09/2017   Procedure: INTRAUTERINE DEVICE (IUD) REMOVAL;  Surgeon: Cleotilde Ronal RAMAN, MD;  Location: [facility];  Service: Gynecology;  Laterality: N/A;   ROOT CANAL  07/2014   TOTAL LAPAROSCOPIC HYSTERECTOMY WITH SALPINGECTOMY Bilateral 07/09/2017   Procedure: TOTAL LAPAROSCOPIC HYSTERECTOMY WITH SALPINGECTOMY;  Surgeon: Cleotilde Ronal RAMAN, MD;  Location: [facility];  Service: Gynecology;  Laterality: Bilateral;   WISDOM TOOTH EXTRACTION  1999    Allergies  Allergen Reactions   Barley Green Itching   Egg Protein-Containing Drug Products Nausea Only   Ginger Other (See Comments)    Nose itchy   Imitrex [Sumatriptan Base] Nausea Only and Swelling   Soy Allergy (Obsolete) Itching   Wool Alcohol [Lanolin] Other (See Comments)    Face swelling,  skin tight    BP 110/78   Ht 5' 7 (1.702 m)   LMP 03/18/2017 (Exact Date)   BMI 29.76 kg/m       No data to display              No data to display              Objective:  Physical Exam:  Gen: NAD, comfortable in exam room  Left foot No obvious deformity on inspection She maintains full range of motion of the left ankle There is mild TTP along the left lateral midfoot and at the base of the fifth metatarsal. The left foot is grossly NV intact  Limited ultrasound of left foot (11/25/2023) if you have Impression: - Mild increased fluid along distal peroneus brevis at the base of the fifth metatarsal - Mild degenerative changes along the  fifth TMT joint Images and interpretation completed by Rainell Cedar, DO  Assessment and Plan:  Left foot pain Previous ultrasound evaluation from June showed degenerative changes along the TMT joints and was also notable for anechoic fluid at the site of peroneal brevis insertion at the base of the fifth metatarsal.  I recommend that she complete the previously ordered x-ray to correlate with ultrasound findings.  I also recommended that she resume wearing orthotics with supportive footwear.  Can try icing and Voltaren gel for pain relief.  We will tentatively plan for follow-up in 4 to 6 weeks for reassessment.

## 2024-04-30 DIAGNOSIS — G4733 Obstructive sleep apnea (adult) (pediatric): Secondary | ICD-10-CM | POA: Diagnosis not present

## 2024-05-05 ENCOUNTER — Ambulatory Visit: Payer: Self-pay | Admitting: Family Medicine

## 2024-05-05 NOTE — Progress Notes (Signed)
 Xrays reviewed, MyChart message sent.

## 2024-05-13 DIAGNOSIS — M25511 Pain in right shoulder: Secondary | ICD-10-CM | POA: Diagnosis not present

## 2024-06-01 DIAGNOSIS — M25511 Pain in right shoulder: Secondary | ICD-10-CM | POA: Diagnosis not present

## 2024-06-08 ENCOUNTER — Other Ambulatory Visit: Payer: Self-pay | Admitting: Radiology

## 2024-06-08 DIAGNOSIS — N951 Menopausal and female climacteric states: Secondary | ICD-10-CM

## 2024-06-09 NOTE — Telephone Encounter (Signed)
 Med refill request: progesterone  100 mg Last AEX: 06/27/23 Next AEX: 07/08/24 Last MMG (if hormonal med) 12/05/23 BI-RADS 1 negative Last refill: 03/11/2024 Refill authorized: progesterone  100 mg #90, zero refills.

## 2024-06-12 ENCOUNTER — Other Ambulatory Visit: Payer: Self-pay | Admitting: Radiology

## 2024-06-12 DIAGNOSIS — N951 Menopausal and female climacteric states: Secondary | ICD-10-CM

## 2024-06-12 NOTE — Telephone Encounter (Signed)
 Med refill request: estradiol  0.05 mg Last AEX: 06/27/23 Next AEX: 07/08/24 Last MMG (if hormonal med) 12/05/23 BI-RADS 1 negative Last refill: 03/11/2024  Refill authorized: estradiol  0.05 mg.

## 2024-06-22 DIAGNOSIS — M25511 Pain in right shoulder: Secondary | ICD-10-CM | POA: Diagnosis not present

## 2024-07-08 ENCOUNTER — Ambulatory Visit: Admitting: Radiology

## 2024-07-17 ENCOUNTER — Other Ambulatory Visit (HOSPITAL_COMMUNITY)
Admission: RE | Admit: 2024-07-17 | Discharge: 2024-07-17 | Disposition: A | Source: Ambulatory Visit | Attending: Radiology | Admitting: Radiology

## 2024-07-17 ENCOUNTER — Encounter: Payer: Self-pay | Admitting: Radiology

## 2024-07-17 ENCOUNTER — Ambulatory Visit (INDEPENDENT_AMBULATORY_CARE_PROVIDER_SITE_OTHER): Admitting: Radiology

## 2024-07-17 VITALS — BP 108/66 | HR 52 | Ht 67.25 in | Wt 194.0 lb

## 2024-07-17 DIAGNOSIS — Z01419 Encounter for gynecological examination (general) (routine) without abnormal findings: Secondary | ICD-10-CM | POA: Diagnosis not present

## 2024-07-17 DIAGNOSIS — Z1331 Encounter for screening for depression: Secondary | ICD-10-CM | POA: Diagnosis not present

## 2024-07-17 DIAGNOSIS — N951 Menopausal and female climacteric states: Secondary | ICD-10-CM | POA: Diagnosis not present

## 2024-07-17 NOTE — Progress Notes (Signed)
" ° °  Caroline Hayes [facility] [birth year] [id number]   History:  49 y.o. G0 presents for annual exam.Doing well on HRT. Still has occasional hot flashes and brain fog. Using small doses of Ritalin  which helps with focus. No new gyn concerns. Has a PCP. Plans to start strength training this month.   Gynecologic History Hysterectomy  Health Maintenance Last Pap: 2022. Results were: normal Last mammogram: 6/25 Last colonoscopy: <6 years ago at [facility]      07/17/2024    7:58 AM 06/27/2023    9:43 AM 07/25/2022    2:48 PM  Depression screen PHQ 2/9  Decreased Interest 0 0 0  Down, Depressed, Hopeless 0  0  PHQ - 2 Score 0 0 0     Past medical history, past surgical history, family history and social history were all reviewed and documented in the EPIC chart.  ROS:  A ROS was performed and pertinent positives and negatives are included.  Exam:  Vitals:   07/17/24 0756  BP: 108/66  Pulse: (!) 52  SpO2: 97%  Weight: 194 lb (88 kg)  Height: 5' 7.25 (1.708 m)   Body mass index is 30.16 kg/m.  General appearance:  Normal Thyroid:  Symmetrical, normal in size, without palpable masses or nodularity. Respiratory  Auscultation:  Clear without wheezing or rhonchi Cardiovascular  Auscultation:  Regular rate, without rubs, murmurs or gallops  Edema/varicosities:  Not grossly evident Abdominal  Soft,nontender, without masses, guarding or rebound.  Liver/spleen:  No organomegaly noted  Hernia:  None appreciated  Skin  Inspection:  Grossly normal Breasts: Examined lying and sitting.   Right: Without masses, retractions, nipple discharge or axillary adenopathy.   Left: Without masses, retractions, nipple discharge or axillary adenopathy. Genitourinary   Inguinal/mons:  Normal without inguinal adenopathy  External genitalia:  Normal appearing vulva with no masses, tenderness, or lesions  BUS/Urethra/Skene's glands:  Normal  Vagina:  Normal appearing with normal color and discharge, no  lesions.   Cervix:  absent  Uterus:  absent  Adnexa/parametria:     Rt: Normal in size, without masses or tenderness.   Lt: Normal in size, without masses or tenderness.  Anus and perineum: Normal   Caroline Hayes, CMA present for exam  Assessment/Plan:   1. Well woman exam with routine gynecological exam (Primary) - Cytology - PAP( [facility]) - Mammogram yearly - Labs with PCP  2. Perimenopausal symptoms Continue current doses of HRT, does not need refills at this time Will send a message when she needs refills and if she would like to increase.  3. Depression screening negative    Return in 1 year for annual or sooner prn.  Caroline Hayes B WHNP-BC 8:52 AM 07/17/2024  "

## 2024-07-22 ENCOUNTER — Ambulatory Visit: Payer: Self-pay | Admitting: Radiology

## 2024-07-22 LAB — CYTOLOGY - PAP
Adequacy: ABSENT
Comment: NEGATIVE
Diagnosis: NEGATIVE
High risk HPV: NEGATIVE
